# Patient Record
Sex: Male | Born: 1960 | Race: Black or African American | Hispanic: No | Marital: Married | Smoking: Never smoker
Health system: Southern US, Community
[De-identification: ages and names within clinical notes are randomized; demographics above are authoritative.]

## PROBLEM LIST (undated history)

## (undated) DIAGNOSIS — E0590 Thyrotoxicosis, unspecified without thyrotoxic crisis or storm: Secondary | ICD-10-CM

## (undated) DIAGNOSIS — K621 Rectal polyp: Secondary | ICD-10-CM

## (undated) DIAGNOSIS — M51369 Other intervertebral disc degeneration, lumbar region without mention of lumbar back pain or lower extremity pain: Secondary | ICD-10-CM

## (undated) DIAGNOSIS — I10 Essential (primary) hypertension: Secondary | ICD-10-CM

## (undated) DIAGNOSIS — K602 Anal fissure, unspecified: Secondary | ICD-10-CM

## (undated) DIAGNOSIS — K219 Gastro-esophageal reflux disease without esophagitis: Secondary | ICD-10-CM

## (undated) DIAGNOSIS — D179 Benign lipomatous neoplasm, unspecified: Secondary | ICD-10-CM

## (undated) DIAGNOSIS — K760 Fatty (change of) liver, not elsewhere classified: Secondary | ICD-10-CM

## (undated) DIAGNOSIS — E119 Type 2 diabetes mellitus without complications: Secondary | ICD-10-CM

## (undated) HISTORY — PX: BUNIONECTOMY: SHX129

## (undated) HISTORY — DX: Anal fissure, unspecified: K60.2

## (undated) HISTORY — DX: Fatty (change of) liver, not elsewhere classified: K76.0

## (undated) HISTORY — PX: COLONOSCOPY: SHX174

## (undated) HISTORY — DX: Essential (primary) hypertension: I10

## (undated) HISTORY — DX: Rectal polyp: K62.1

## (undated) HISTORY — DX: Type 2 diabetes mellitus without complications: E11.9

## (undated) HISTORY — PX: SHOULDER SURGERY: SHX246

## (undated) HISTORY — PX: CATARACT EXTRACTION: SUR2

## (undated) HISTORY — PX: CATARACT EXTRACTION W/ INTRAOCULAR LENS IMPLANT: SHX1309

## (undated) HISTORY — DX: Other intervertebral disc degeneration, lumbar region without mention of lumbar back pain or lower extremity pain: M51.369

## (undated) HISTORY — DX: Benign lipomatous neoplasm, unspecified: D17.9

---

## 2001-01-22 ENCOUNTER — Encounter: Payer: Self-pay | Admitting: Family Medicine

## 2001-01-22 ENCOUNTER — Ambulatory Visit (HOSPITAL_COMMUNITY): Admission: RE | Admit: 2001-01-22 | Discharge: 2001-01-22 | Payer: Self-pay | Admitting: Family Medicine

## 2002-12-15 ENCOUNTER — Encounter
Admission: RE | Admit: 2002-12-15 | Discharge: 2002-12-15 | Payer: Self-pay | Admitting: Physical Medicine and Rehabilitation

## 2002-12-15 ENCOUNTER — Encounter: Payer: Self-pay | Admitting: Physical Medicine and Rehabilitation

## 2004-06-20 ENCOUNTER — Encounter: Admission: RE | Admit: 2004-06-20 | Discharge: 2004-06-20 | Payer: Self-pay | Admitting: Family Medicine

## 2005-07-23 HISTORY — PX: CERVICAL DISC ARTHROPLASTY: SHX587

## 2005-08-02 ENCOUNTER — Ambulatory Visit (HOSPITAL_COMMUNITY): Admission: AD | Admit: 2005-08-02 | Discharge: 2005-08-05 | Payer: Self-pay | Admitting: Neurosurgery

## 2006-10-08 IMAGING — CR DG SPINE 1V PORT
1 series · 1 of 1 positions shown · non-contrast
Comparison: none

CLINICAL DATA: Anterior cervical diskectomy and fusion.
PORTABLE CERVICAL SPINE, ONE VIEW:

[view not recorded]
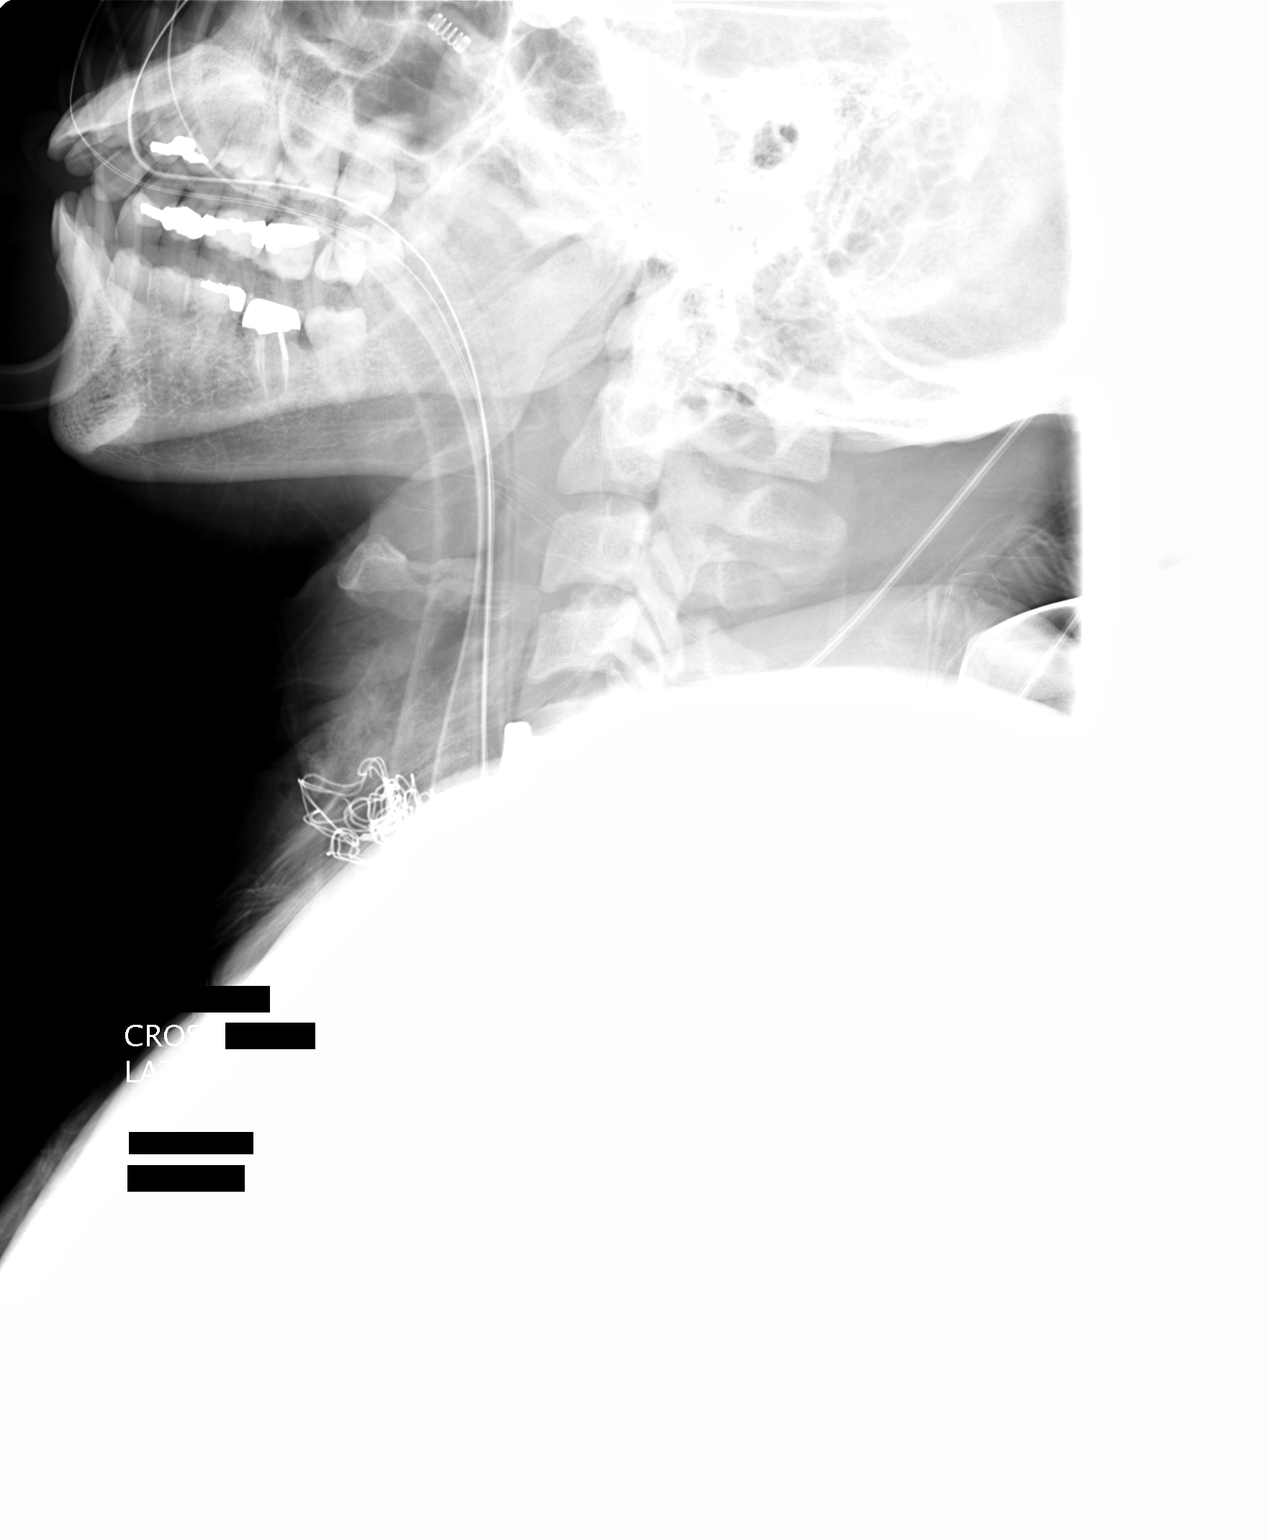

[1 of 1 positions shown; findings below may reference images not displayed]

FINDINGS: Cross-table lateral view obtained at 2050 hours is correlated with the intraoperative views done earlier today.  This view demonstrates the superior aspect of an anterior plate and screws at the C5 level.  There is no visualization of the cervical spine inferior to C5 due to the patient?s overlapping shoulders.
IMPRESSION: Intraoperative views during cervical fusion as described.  The visualization inferior to C5 is limited.  Radiographic follow-up in the department is suggested.

## 2009-10-03 ENCOUNTER — Encounter: Admission: RE | Admit: 2009-10-03 | Discharge: 2009-10-03 | Payer: Self-pay | Admitting: Family Medicine

## 2010-08-04 ENCOUNTER — Emergency Department (HOSPITAL_COMMUNITY)
Admission: EM | Admit: 2010-08-04 | Discharge: 2010-08-04 | Payer: Self-pay | Source: Home / Self Care | Admitting: Family Medicine

## 2010-12-08 NOTE — Op Note (Signed)
Jeffery [name], Jeffery [name]              ACCOUNT NO.:  [id number]   MEDICAL RECORD NO.:  [id number]          PATIENT TYPE:  AMB   LOCATION:  SDS                          FACILITY:  [facility]   PHYSICIAN:  Danae Orleans. Venetia Maxon, M.D.  DATE OF BIRTH:  1961/07/04   DATE OF PROCEDURE:  08/02/2005  DATE OF DISCHARGE:                                 OPERATIVE REPORT   PREOPERATIVE DIAGNOSIS:  Herniated cervical disc with spondylosis,  degenerative disc disease and cervical radiculopathy C5-6.   POSTOPERATIVE DIAGNOSIS:  Herniated cervical disc with spondylosis,  degenerative disc disease and cervical radiculopathy C5-6.   PROCEDURE:  Anterior cervical decompression and fusion C5-6 with PEEK  interbody cage, morselized bone autograft and anterior cervical plate.   SURGEON:  Danae Orleans. Venetia Maxon, M.D.   ASSISTANT:  Cristi Loron, M.D.   ANESTHESIA:  General endotracheal anesthesia.   ESTIMATED BLOOD LOSS:  Minimal.   COMPLICATIONS:  None.   DISPOSITION:  Recovery room.   INDICATIONS FOR PROCEDURE:  Jeffery [name] is a 50 year old man with  significant left C6 radiculopathy.  It was elected to take him to surgery  for anterior cervical decompression and fusion at C5-6 level.   DESCRIPTION OF PROCEDURE:  Jeffery [name] was brought to the operating room.  Following satisfactory and uncomplicated induction of general endotracheal  anesthesia and placement of intravenous lines, patient was placed in the  supine position on the operating table.  His neck was placed in slight  extension.  He was placed in 10 pounds of Halter traction. His anterior neck  was then prepped and draped in the usual sterile fashion.  Area of planned  incision was infiltrated with 0.25% Marcaine and 0.5% lidocaine with  1:200,000 epinephrine.  Incision was made from the midline to the anterior  border of the sternocleidomastoid, carried sharply through the platysma  muscle.  Subplatysmal dissection was performed exposing the  anterior border  of sternocleidomastoid muscle.  Using blunt dissection, the carotid sheath  was kept lateral and trachea and esophagus kept medial exposing the anterior  cervical spine.  A bent spinal needle was placed through what was felt to be  the C5-6 level and this was confirmed on intraoperative x-ray.  Subsequently, the longus colli muscles were taken down along the anterior  cervical spine from C5 through C6 level and Shadowline self-retaining  retractor was placed to facilitate exposure.  The anterior space was then  incised and ventral osteophytes removed with Leksell rongeur and retained  for later use as bone graft material.  The interspace was then decorticated  with high speed drill and the bone drillings were retained for later use  with bone grafting.  There end plates were decorticated along the large  uncinated spurs.  The posterior longitudinal ligament was then incised with  arachnoid knife and removed in piecemeal fashion.  There was a significant  amount of lateral disc herniation causing compression of the cervical spinal  cord and also the C6 nerve root and this was decompressed as it extended out  the neural foramen on the left side of the midline.  Spinal cord  dura and  both C6 nerve roots were well decompressed.  Hemostasis was assured with  Gelfoam soaked in thrombus.  After trial sizing a 7 mm PEEK interbody cage  was selected, packed with morselized bone autograft, inserted in the  interspace and countersunk appropriately and subsequently a 16 mm anterior  cervical plate was affixed to the anterior cervical spine.  After the  traction weight was removed, the plate was affixed with 14 mm variable angle  screws, two at C5 and two at C6.  All screws had excellent purchase.  Locking mechanisms were engaged.  Final x-ray demonstrated the top of the  anterior cervical plate without the lower part of the construct visualized  because of the patient's large body  habitus.  The wound was then copiously  irrigated with bacitracin and saline.  Soft tissues were inspected and found  to be in good repair.  The platysmal layer was reapproximated with 3-0  Vicryl interrupted inverted sutures and the skin edges were reapproximated  with interrupted 3-0 Vicryl subcutaneous stitch.  The wound was dressed with  Dermabond.  The patient was extubated in the operating room and taken to the  recovery room in stable and satisfactory condition having tolerated the  procedure well.  Counts correct at the end of the case.      Danae Orleans. Venetia Maxon, M.D.  Electronically Signed     JDS/MEDQ  D:  08/02/2005  T:  08/03/2005  Job:  [id number]

## 2011-01-23 ENCOUNTER — Encounter (INDEPENDENT_AMBULATORY_CARE_PROVIDER_SITE_OTHER): Payer: Self-pay | Admitting: General Surgery

## 2011-01-29 NOTE — Progress Notes (Signed)
This encounter was created in error - please disregard.

## 2011-04-18 ENCOUNTER — Encounter (HOSPITAL_BASED_OUTPATIENT_CLINIC_OR_DEPARTMENT_OTHER)
Admission: RE | Admit: 2011-04-18 | Discharge: 2011-04-18 | Disposition: A | Discharge status: Home / Self Care | Payer: BC Managed Care – PPO | Source: Ambulatory Visit | Attending: Orthopedic Surgery | Admitting: Orthopedic Surgery

## 2011-04-18 LAB — BASIC METABOLIC PANEL
BUN: 17 mg/dL (ref 6–23)
CO2: 30 mEq/L (ref 19–32)
Chloride: 100 mEq/L (ref 96–112)
Creatinine, Ser: 1.09 mg/dL (ref 0.50–1.35)
GFR calc non Af Amer: >60 mL/min (ref >60)
Glucose, Bld: 96 mg/dL (ref 70–99)

## 2011-04-23 ENCOUNTER — Ambulatory Visit (HOSPITAL_BASED_OUTPATIENT_CLINIC_OR_DEPARTMENT_OTHER)
Admission: RE | Admit: 2011-04-23 | Discharge: 2011-04-23 | Disposition: A | Discharge status: Home / Self Care | Payer: BC Managed Care – PPO | Source: Ambulatory Visit | Attending: Orthopedic Surgery | Admitting: Orthopedic Surgery

## 2011-04-23 DIAGNOSIS — Z01812 Encounter for preprocedural laboratory examination: Secondary | ICD-10-CM | POA: Insufficient documentation

## 2011-04-23 DIAGNOSIS — W19XXXA Unspecified fall, initial encounter: Secondary | ICD-10-CM | POA: Insufficient documentation

## 2011-04-23 DIAGNOSIS — M67919 Unspecified disorder of synovium and tendon, unspecified shoulder: Secondary | ICD-10-CM | POA: Insufficient documentation

## 2011-04-23 DIAGNOSIS — Y929 Unspecified place or not applicable: Secondary | ICD-10-CM | POA: Insufficient documentation

## 2011-04-23 DIAGNOSIS — M719 Bursopathy, unspecified: Secondary | ICD-10-CM | POA: Insufficient documentation

## 2011-04-23 LAB — POCT HEMOGLOBIN-HEMACUE: Hemoglobin: 15 g/dL (ref 13.0–17.0)

## 2011-05-01 NOTE — Op Note (Signed)
[name], [name]              ACCOUNT NO.:  [id number]  MEDICAL RECORD NO.:  [id number]  LOCATION:                                 FACILITY:  PHYSICIAN:  Jones Broom, MD         DATE OF BIRTH:  DATE OF PROCEDURE:  04/23/2011 DATE OF DISCHARGE:                              OPERATIVE REPORT   PREOPERATIVE DIAGNOSES: 1. Right shoulder infraspinatus tear. 2. Right shoulder irreparable supraspinatus tear.  POSTOPERATIVE DIAGNOSES: 1. Right shoulder infraspinatus tear. 2. Right shoulder irreparable supraspinatus tear.  PROCEDURE PERFORMED: 1. Right shoulder arthroscopic repair of infraspinatus. 2. Right shoulder arthroscopic debridement of irreparable     supraspinatus tear.  ATTENDING SURGEON:  Jones Broom, MD  ASSISTANT:  None.  ANESTHESIA:  GETA with preoperative interscalene block.  COMPLICATIONS:  None.  DRAINS:  None.  SPECIMENS:  None.  ESTIMATED BLOOD LOSS:  Minimal.  INDICATIONS FOR SURGERY:  Jeffery [name] is a 50 year old gentleman who had previous surgery by my partner, Dr. Turner Daniels including subacromial decompression, distal clavicle excision, and debridement of supraspinatus tear in November 01, 2010.  The tear was deemed irreparable at that time.  He had a fall approximately 1 month ago extending the tear into the infraspinatus  verified by MRI imaging.  He was referred to me for possible treatment options.  The MRI did show some intramuscular and musculotendinous component to the tear but I felt that if any repair was possible it should be attempted as soon as possible. The patient agreed and wished to go ahead with surgery.  He understood risks, benefits and alternatives to surgery including but not limited to risk of nonhealing, re-tear, and a potential for stiffness.  He understood all this and elected to go forward with surgery.  OPERATIVE FINDINGS:  The supraspinatus tendon was retracted beyond the glenoid and was immobile and scarred in.  The  infraspinatus tear appeared to be fresh with some hyperemia at the posterior tuberosity. There was some component of the tear at the musculotendinous junction and the residual tendon was noted on the posterior aspect of tuberosity and was debrided.  He also was noted to have some residual supraspinatus tendon on the superior aspect of the greater tuberosity which was also debrided back to the tuberosity.  Extensive mobilization of the infraspinatus was carried out performing a complete capsular release posteriorly as well as release of the subdeltoid adhesions and a posterior interval release.  After all these releases, I was able to just bring the tendon to the prepared tuberosity and therefore felt that it was worth trying to repair this.  The repair was carried out in a double row fashion with a 5.5-mm BioComposite corkscrew anchor in the medial row and a 4.5 mm BioComposite PushLock anchor in the lateral row.  PROCEDURE:  The patient was identified in the preoperative holding area where I personally marked the operative site after verifying the site, side, and procedure with the patient.  He was taken back to the operating room where general anesthesia was induced without complication.  He did have interscalene block given in the preoperative holding area by the attending anesthesiologist.  After general anesthesia was induced, he was  placed in the beach-chair position with all extremities carefully padded in position.  The right upper extremity was prepped and draped in a standard sterile fashion.  He did receive IV antibiotics, and the appropriate time-out procedure was carried out.  A standard posterior portal was established and the arthroscope was introduced into the joint with diagnostic arthroscopy carried out with findings as described above  after an anterior portal was established just above the subscapularis with needle localization.  He was noted to have some residual  tendon on the superior aspect of the greater tuberosity, which was debrided back to the tuberosity to ensure a smooth surface and no impingement of mechanical symptoms.  There was approximately 8 mm or so of residual tendon laterally.  The supraspinatus was noted to be retracted beyond the glenoid margin and was certainly not repairable.  The joint surfaces were intact. Subscapularis was completely intact.  Glenohumeral ligaments and labrum was intact.  The infraspinatus was noted to be torn and retracted.  On careful exam,  there was noted to be some residual tendon on the posterior aspect of the greater tuberosity from the infraspinatus.  This was all debrided off the tuberosity as it was not a good tissue and could not be incorporated into the repair.  The remaining infraspinatus tendon was carefully mobilized from the surrounding subdeltoid adhesions and an anterolateral accessory portal was made to view through the lateral portal and a switching stick was placed through the anterolateral accessory portal with the use of the retractor to allow for capsular release from the posterior portal using the ArthroCare. After complete capsular release, a grasper was used to try to bring the tendon over and was unable to bring the tendon within a reasonable distance to hope for a repair.  Therefore, a posterior interval release was performed identifying the spine of the scapula and releasing the interval between the supra and infraspinatus.  The interval between the deltoid and the infraspinatus was also carefully released.  After performing all these releases, I was able to just reduce the tendon through the tuberosity and therefore I felt that a repair would be possible and should be attempted given the limited rotator cuff that he has.  Therefore, I decorticated the tuberosity to promote bony healing and a 5.5-mm BioComposite corkscrew anchor was passed through a percutaneous posterolateral  accessory portal position.  The sutures were then passed one by one in a horizontal mattress configuration through the infraspinatus tendon posteriorly using a 45-degree curved suture lasso.  After each was passed, a grasper was used to reduce the tendon to the tuberosity and each was then tied.  The strands were not cut and strands were then incorporated into a 4.5-mm PushLock anchor, which was placed in the lateral aspect of the posterior tuberosity bringing the tendon nicely down onto the prepared tuberosity.  Sutures were cut.  The repair was viewed from all portals and felt to be good.  Tissue quality was moderate.  I did externally rotate the arm to about 20 degrees to assist in ease of repair.  Therefore, postoperatively, I placed him in an abduction sling immobilizer.  After the arthroscopic equipment was removed from the joint, the portals were closed with 3-0 nylon in interrupted fashion.  Sterile dressings were applied including Xeroform, 4x4s, ABDs, and tape.  The patient was placed in an abduction pillow sling immobilizer, allowed to awaken from general anesthesia, transferred to the stretcher, and taken to the recovery room in stable condition.  POSTOPERATIVE  PLAN:  He will be discharged home with his wife today.  He will follow up with me in 1 week for suture removal and wound check.  He will remain in a sling at all times.     Jones Broom, MD     JC/MEDQ  D:  04/23/2011  T:  04/24/2011  Job:  [id number]  Electronically Signed by Jones Broom  on 05/01/2011 09:30:15 AM

## 2012-02-12 ENCOUNTER — Encounter (INDEPENDENT_AMBULATORY_CARE_PROVIDER_SITE_OTHER): Payer: Self-pay | Admitting: General Surgery

## 2012-02-12 ENCOUNTER — Ambulatory Visit (INDEPENDENT_AMBULATORY_CARE_PROVIDER_SITE_OTHER): Payer: BC Managed Care – PPO | Admitting: General Surgery

## 2012-02-12 VITALS — BP 152/92 | HR 74 | Temp 97.8°F | Resp 16 | Ht 73.0 in | Wt 279.0 lb

## 2012-02-12 DIAGNOSIS — D171 Benign lipomatous neoplasm of skin and subcutaneous tissue of trunk: Secondary | ICD-10-CM | POA: Insufficient documentation

## 2012-02-12 DIAGNOSIS — D1739 Benign lipomatous neoplasm of skin and subcutaneous tissue of other sites: Secondary | ICD-10-CM

## 2012-02-12 NOTE — Progress Notes (Signed)
Patient ID: Jeffery [name], male   DOB: 1961/06/05, 51 y.o.   MRN: [id number]  Chief Complaint  Patient presents with  . Lipoma    HPI Jeffery [name] is a 51 y.o. male.  Multiple symptomatic lipomas HPI  Past Medical History  Diagnosis Date  . Hypertension   . Fatty liver   . Hyperplastic rectal polyp   . Anal fissure   . Lipoma     Past Surgical History  Procedure Date  . Shoulder surgery 2008 and 2012    left in 2008, right had rtc tear 2012  . Cervical disc arthroplasty 2007    C-5 and C-6    Family History  Problem Relation Age of Onset  . Diabetes Mother   . Diabetes Father     Social History History  Substance Use Topics  . Smoking status: Never Smoker   . Smokeless tobacco: Never Used  . Alcohol Use: No    Allergies  Allergen Reactions  . Other Hives    All steroids. Hives on upper torso only.    Current Outpatient Prescriptions  Medication Sig Dispense Refill  . doxepin (SINEQUAN) 10 MG capsule Take 10 mg by mouth 2 (two) times daily.      . methylPREDNISolone (MEDROL) 4 MG tablet Take 4 mg by mouth daily.      Marland Kitchen amLODipine (NORVASC) 5 MG tablet Take 5 mg by mouth daily.        . valsartan-hydrochlorothiazide (DIOVAN-HCT) 320-25 MG per tablet Take 1 tablet by mouth daily.          Review of Systems Review of Systems  All other systems reviewed and are negative.    Blood pressure 152/92, pulse 74, temperature 97.8 F (36.6 C), temperature source Temporal, resp. rate 16, height 6\' 1"  (1.854 m), weight 279 lb (126.554 kg).  Physical Exam Physical Exam  Constitutional: He appears well-developed and well-nourished.  HENT:  Head: Normocephalic and atraumatic.  Eyes: Conjunctivae are normal. Pupils are equal, round, and reactive to light.  Neck: Normal range of motion.  Cardiovascular: Normal rate, regular rhythm and normal heart sounds.   Pulmonary/Chest: Effort normal and breath sounds normal.  Abdominal: Soft. Bowel sounds are normal.       Data Reviewed None  Assessment    Multiple truncal symptomatic lipomas    Plan    Excise truncal lipomas as outpatient       Telesforo Brosnahan O 02/12/2012, 11:48 AM

## 2012-03-13 ENCOUNTER — Ambulatory Visit (HOSPITAL_BASED_OUTPATIENT_CLINIC_OR_DEPARTMENT_OTHER): Admit: 2012-03-13 | Payer: Self-pay | Admitting: General Surgery

## 2012-03-13 ENCOUNTER — Encounter (HOSPITAL_BASED_OUTPATIENT_CLINIC_OR_DEPARTMENT_OTHER): Payer: Self-pay

## 2012-03-13 SURGERY — EXCISION LIPOMA
Anesthesia: Monitor Anesthesia Care

## 2012-05-20 ENCOUNTER — Encounter (HOSPITAL_BASED_OUTPATIENT_CLINIC_OR_DEPARTMENT_OTHER): Admission: RE | Payer: Self-pay | Source: Ambulatory Visit

## 2012-05-20 ENCOUNTER — Ambulatory Visit (HOSPITAL_BASED_OUTPATIENT_CLINIC_OR_DEPARTMENT_OTHER)
Admission: RE | Admit: 2012-05-20 | Payer: Managed Care, Other (non HMO) | Source: Ambulatory Visit | Admitting: General Surgery

## 2012-05-20 SURGERY — EXCISION LIPOMA
Anesthesia: Monitor Anesthesia Care

## 2013-06-19 ENCOUNTER — Emergency Department (HOSPITAL_COMMUNITY)
Admission: EM | Admit: 2013-06-19 | Discharge: 2013-06-19 | Disposition: A | Discharge status: Home / Self Care | Payer: 59 | Attending: Emergency Medicine | Admitting: Emergency Medicine

## 2013-06-19 ENCOUNTER — Emergency Department (HOSPITAL_COMMUNITY): Payer: 59

## 2013-06-19 ENCOUNTER — Encounter (HOSPITAL_COMMUNITY): Payer: Self-pay | Admitting: Emergency Medicine

## 2013-06-19 DIAGNOSIS — IMO0001 Reserved for inherently not codable concepts without codable children: Secondary | ICD-10-CM | POA: Insufficient documentation

## 2013-06-19 DIAGNOSIS — Z79899 Other long term (current) drug therapy: Secondary | ICD-10-CM | POA: Insufficient documentation

## 2013-06-19 DIAGNOSIS — I10 Essential (primary) hypertension: Secondary | ICD-10-CM | POA: Insufficient documentation

## 2013-06-19 DIAGNOSIS — Z862 Personal history of diseases of the blood and blood-forming organs and certain disorders involving the immune mechanism: Secondary | ICD-10-CM | POA: Insufficient documentation

## 2013-06-19 DIAGNOSIS — Z8719 Personal history of other diseases of the digestive system: Secondary | ICD-10-CM | POA: Insufficient documentation

## 2013-06-19 DIAGNOSIS — M25522 Pain in left elbow: Secondary | ICD-10-CM

## 2013-06-19 DIAGNOSIS — Z8639 Personal history of other endocrine, nutritional and metabolic disease: Secondary | ICD-10-CM | POA: Insufficient documentation

## 2013-06-19 DIAGNOSIS — M25529 Pain in unspecified elbow: Secondary | ICD-10-CM | POA: Insufficient documentation

## 2013-06-19 DIAGNOSIS — IMO0002 Reserved for concepts with insufficient information to code with codable children: Secondary | ICD-10-CM | POA: Insufficient documentation

## 2013-06-19 MED ORDER — OXYCODONE-ACETAMINOPHEN 5-325 MG PO TABS
2.0000 | ORAL_TABLET | Freq: Once | ORAL | Status: AC
  Administered 2013-06-19: 2 via ORAL
  Filled 2013-06-19: qty 2

## 2013-06-19 MED ORDER — PREDNISONE 20 MG PO TABS: 40.0000 mg | ORAL_TABLET | Freq: Every day | ORAL | Status: DC

## 2013-06-19 MED ORDER — OXYCODONE-ACETAMINOPHEN 5-325 MG PO TABS: 1.0000 | ORAL_TABLET | Freq: Four times a day (QID) | ORAL | Status: DC | PRN

## 2013-06-19 NOTE — ED Provider Notes (Signed)
CSN: [id number]     Arrival date & time 06/19/13  0213 History   First MD Initiated Contact with Patient 06/19/13 0221     Chief Complaint  Patient presents with  . Elbow Pain   (Consider location/radiation/quality/duration/timing/severity/associated sxs/prior Treatment) HPI Comments: Patient is a 52 year old male with no significant past medical history presents for left elbow pain with onset 3 days ago. Patient states the pain is sharp and stabbing in nature and radiates down his left forearm. Patient saw his primary care doctor 2 days ago who advised Tylenol for pain control as well as followup on Monday. Patient has been taking Tylenol without relief of symptoms and states the pain has been worsening over time. He states the pain is aggravated with movement of his left elbow as well as with palpation to his radial head. Patient denies associated fever, redness, numbness/tingling, and weakness.  The history is provided by the patient. No language interpreter was used.    Past Medical History  Diagnosis Date  . Hypertension   . Fatty liver   . Hyperplastic rectal polyp   . Anal fissure   . Lipoma    Past Surgical History  Procedure Laterality Date  . Shoulder surgery  2008 and 2012    left in 2008, right had rtc tear 2012  . Cervical disc arthroplasty  2007    C-5 and C-6   Family History  Problem Relation Age of Onset  . Diabetes Mother   . Diabetes Father    History  Substance Use Topics  . Smoking status: Never Smoker   . Smokeless tobacco: Never Used  . Alcohol Use: No    Review of Systems  Musculoskeletal: Positive for arthralgias and myalgias.  All other systems reviewed and are negative.    Allergies  Other and Nsaids  Home Medications   Current Outpatient Rx  Name  Route  Sig  Dispense  Refill  . amLODipine (NORVASC) 5 MG tablet   Oral   Take 5 mg by mouth daily.           . Coenzyme Q10 (CO Q 10 PO)   Oral   Take 1 tablet by mouth daily.        Marland Kitchen omega-3 acid ethyl esters (LOVAZA) 1 G capsule   Oral   Take 1 g by mouth daily.         . valsartan-hydrochlorothiazide (DIOVAN-HCT) 320-25 MG per tablet   Oral   Take 1 tablet by mouth daily.           Marland Kitchen oxyCODONE-acetaminophen (PERCOCET/ROXICET) 5-325 MG per tablet   Oral   Take 1-2 tablets by mouth every 6 (six) hours as needed for severe pain.   13 tablet   0   . predniSONE (DELTASONE) 20 MG tablet   Oral   Take 2 tablets (40 mg total) by mouth daily.   10 tablet   0    BP 134/74  Pulse 62  Temp(Src) 98 F (36.7 C) (Oral)  Resp 18  Ht 6\' 2"  (1.88 m)  Wt 258 lb (117.028 kg)  BMI 33.11 kg/m2  SpO2 100%  Physical Exam  Nursing note and vitals reviewed. Constitutional: He is oriented to person, place, and time. He appears well-developed and well-nourished. No distress.  HENT:  Head: Normocephalic and atraumatic.  Eyes: Conjunctivae and EOM are normal. No scleral icterus.  Neck: Normal range of motion.  Cardiovascular: Normal rate, regular rhythm and intact distal pulses.   Distal radial  pulses 2+ bilaterally. Capillary refill normal.  Pulmonary/Chest: Effort normal. No respiratory distress.  Musculoskeletal:       Left elbow: He exhibits decreased range of motion (Secondary to pain). He exhibits no swelling, no effusion and no deformity. Tenderness (mild, proximal and distal to elbow) found. Radial head tenderness noted. No olecranon process tenderness noted.       Left upper arm: He exhibits tenderness. He exhibits no bony tenderness, no swelling and no edema.       Left forearm: He exhibits tenderness. He exhibits no bony tenderness, no swelling and no edema.       Arms: Neurological: He is alert and oriented to person, place, and time.  No gross sensory deficits appreciated. 5/5 strength against resistance with flexion and extension of L elbow.   Skin: Skin is warm and dry. No rash noted. He is not diaphoretic. No erythema. No pallor.  Psychiatric: He  has a normal mood and affect. His behavior is normal.    ED Course  Procedures (including critical care time) Labs Review Labs Reviewed - No data to display  Imaging Review Dg Elbow Complete Left  06/19/2013   CLINICAL DATA:  Severe left elbow pain. No known injury. Difficulty in positioning patient for lateral oblique views due to pain.  EXAM: LEFT ELBOW - COMPLETE 3+ VIEW  COMPARISON:  None.  FINDINGS: There is cortical irregularity over the coronoid process with focal cortical depression suggesting a fracture. There is a small left elbow effusion. Prominent olecranon spur.  IMPRESSION: Cortical changes consistent with fracture of the coronoid process of the ulna.   Electronically Signed   By: Burman Nieves M.D.   On: 06/19/2013 04:24    EKG Interpretation   None       MDM   1. Elbow pain, left    Uncomplicated left elbow pain. Patient well and nontoxic appearing, hemodynamically stable, and afebrile. Patient neurovascularly intact on physical exam. Bony tenderness appreciated as well as decreased range of motion secondary to pain. No crepitus, deformities, or effusions appreciated. X-ray today significant for cortical changes of ulna. Radiologist questions fracture of coronoid process of the ulna given these findings; however, my suspicion is lower for this given areas of bony tenderness on patient's physical exam. Still, patient has been instructed to continue use of his arm sling which was provided by his primary care provider. Pain improved with Percocet. No evidence of septic joint.   Patient stable and appropriate for discharge with orthopedic followup. Have also advised he keep his primary care appointment on Monday; patient reliable for followup. Percocet prescribed for pain control as needed. Return precautions discussed and patient agreeable to plan with no unaddressed concerns.    Antony Madura, PA-C 06/22/13 [phone]

## 2013-06-19 NOTE — ED Notes (Signed)
Pt states he first notices his left elbow pain on Tuesday of this week, pt states he saw his primary care provider Wednesday and his MD told him to follow up on Monday if the pain did not improve. Pt states he does not know what caused his elbow pain and denies any injury to the site. Pt states the pain is not getting any better, pt is able to make a fist but has pain upon doing so, pt also has pain with flexion of the wrist. Pt is unable to extend or hyperextend his left elbow. Pt states the left elbow is very tender to the touch. Pt states the pain is so bad that he cannot sleep. Pt states he could not wait until Monday to follow up with his primary. Pt states he has an allergy NSAIDS. Pt states he has been taken tylenol to manage his pain but it has not given him any relief. Pt is A&O X4.

## 2013-06-23 NOTE — ED Provider Notes (Signed)
Medical screening examination/treatment/procedure(s) were performed by non-physician practitioner and as supervising physician I was immediately available for consultation/collaboration.  EKG Interpretation   None         Shanna Cisco, MD 06/23/13 [id number]

## 2014-01-26 ENCOUNTER — Other Ambulatory Visit: Payer: Self-pay | Admitting: Family Medicine

## 2014-01-26 DIAGNOSIS — R202 Paresthesia of skin: Secondary | ICD-10-CM

## 2014-01-31 ENCOUNTER — Other Ambulatory Visit: Payer: 59

## 2014-02-25 ENCOUNTER — Ambulatory Visit: Payer: 59

## 2014-03-04 ENCOUNTER — Ambulatory Visit: Payer: 59

## 2014-03-09 ENCOUNTER — Ambulatory Visit: Payer: 59

## 2014-03-11 ENCOUNTER — Ambulatory Visit: Payer: 59

## 2014-03-16 ENCOUNTER — Ambulatory Visit: Payer: 59

## 2014-03-23 ENCOUNTER — Ambulatory Visit: Payer: 59

## 2015-05-03 ENCOUNTER — Ambulatory Visit: Payer: Managed Care, Other (non HMO)

## 2015-05-10 ENCOUNTER — Ambulatory Visit: Payer: Managed Care, Other (non HMO)

## 2015-05-12 ENCOUNTER — Encounter: Payer: 59 | Attending: Family Medicine

## 2015-05-12 VITALS — Ht 73.0 in | Wt 270.1 lb

## 2015-05-12 DIAGNOSIS — Z713 Dietary counseling and surveillance: Secondary | ICD-10-CM | POA: Insufficient documentation

## 2015-05-12 DIAGNOSIS — E119 Type 2 diabetes mellitus without complications: Secondary | ICD-10-CM | POA: Diagnosis present

## 2015-05-12 NOTE — Progress Notes (Signed)

## 2015-05-17 ENCOUNTER — Ambulatory Visit: Payer: Managed Care, Other (non HMO)

## 2015-05-19 ENCOUNTER — Ambulatory Visit: Payer: Managed Care, Other (non HMO)

## 2015-05-26 ENCOUNTER — Ambulatory Visit: Payer: Managed Care, Other (non HMO)

## 2015-05-31 ENCOUNTER — Encounter: Payer: 59 | Attending: Family Medicine

## 2015-05-31 DIAGNOSIS — E119 Type 2 diabetes mellitus without complications: Secondary | ICD-10-CM | POA: Diagnosis present

## 2015-05-31 DIAGNOSIS — Z713 Dietary counseling and surveillance: Secondary | ICD-10-CM | POA: Insufficient documentation

## 2015-06-01 NOTE — Progress Notes (Signed)

## 2015-06-07 DIAGNOSIS — E119 Type 2 diabetes mellitus without complications: Secondary | ICD-10-CM

## 2015-06-09 ENCOUNTER — Ambulatory Visit: Payer: Managed Care, Other (non HMO)

## 2015-06-09 NOTE — Progress Notes (Signed)
Patient was seen on 06/07/15 for the third of a series of three diabetes self-management courses at the [facility].   Jeffery [name] the amount of activity recommended for healthy living . Describe activities suitable for individual needs . Identify ways to regularly incorporate activity into daily life . Identify barriers to activity and ways to over come these barriers  Identify diabetes medications being personally used and their primary action for lowering glucose and possible side effects . Describe role of stress on blood glucose and develop strategies to address psychosocial issues . Identify diabetes complications and ways to prevent them  Explain how to manage diabetes during illness . Evaluate success in meeting personal goal . Establish 2-3 goals that they will plan to diligently work on until they return for the  32-month follow-up visit  Goals:   I will count my carb choices at most meals and snacks  I will be active 60 minutes or more 5 times a week  To help manage stress I will  workout at least 4 times a week  Your patient has identified these potential barriers to change:  Stress  Your patient has identified their diabetes self-care support plan as  Family Support Plan:  Attend Optional Core 4 in 4 months

## 2015-06-30 ENCOUNTER — Ambulatory Visit: Payer: Managed Care, Other (non HMO)

## 2015-10-12 ENCOUNTER — Ambulatory Visit: Payer: 59

## 2016-08-02 ENCOUNTER — Other Ambulatory Visit: Payer: Self-pay | Admitting: *Deleted

## 2016-08-02 ENCOUNTER — Encounter: Payer: Self-pay | Admitting: Podiatry

## 2016-08-02 ENCOUNTER — Ambulatory Visit (INDEPENDENT_AMBULATORY_CARE_PROVIDER_SITE_OTHER): Payer: 59 | Admitting: Podiatry

## 2016-08-02 ENCOUNTER — Ambulatory Visit (INDEPENDENT_AMBULATORY_CARE_PROVIDER_SITE_OTHER): Payer: 59

## 2016-08-02 DIAGNOSIS — Z0189 Encounter for other specified special examinations: Secondary | ICD-10-CM

## 2016-08-02 DIAGNOSIS — M2012 Hallux valgus (acquired), left foot: Secondary | ICD-10-CM | POA: Diagnosis not present

## 2016-08-02 DIAGNOSIS — E119 Type 2 diabetes mellitus without complications: Secondary | ICD-10-CM

## 2016-08-02 NOTE — Progress Notes (Signed)
   Subjective:    Patient ID: Jeffery [name], male    DOB: 24-Apr-1961, 56 y.o.   MRN: [id number]  HPI: He presents today for second opinion regarding a procedure that had been performed by another local podiatrist December 7. He has spur removed from the dorsal aspect of the great toe and a bunion repair performed. Since that time he has followed out of favor with the Dr. siding poor communication. He states that he has nothing to go by and no expectations.    Review of Systems  Musculoskeletal: Positive for arthralgias and gait problem.  Skin: Positive for color change.  All other systems reviewed and are negative.      Objective:   Physical Exam: Vital signs are stable he is alert and oriented 3 have reviewed his past mental history medications allergy surgery social history the paperwork that he brought with him explaining his surgery. He also brought a disc for his x-rays. Moderate edema to the left foot pulses are strongly palpable. Good range of motion of the first metatarsophalangeal joint. I reviewed radiographs brought with him today which do demonstrate a Weil type osteotomy to the first metatarsal with screw fixation as well as a IPJ fusion of the hallux. Everything appears to be in good alignment and healing well. I do think that the screw used for the IPJ fusion is small however it appears to be compressing the joint. There are no open wounds or lesions. No signs of infection.          Assessment & Plan:  Assessment: Vital signs are stable wounds have healed one month postop left foot surgery.  Plan: I explained to him that I would be happy to take care of him as he heals since he will not go back to his previous doctor. I expressed to him that at this point he needs to be in a Darco shoe and is not to be in any other shoe at this point. I did explain that he may wear his Cam Walker as needed. I will follow-up with him in 1 month at which time radiographs will be taken. I  did express to him how to compress the toe with [place] and he will continue to do this.

## 2016-08-07 ENCOUNTER — Encounter: Payer: Self-pay | Admitting: Podiatry

## 2016-08-30 ENCOUNTER — Ambulatory Visit (INDEPENDENT_AMBULATORY_CARE_PROVIDER_SITE_OTHER): Payer: 59 | Admitting: Podiatry

## 2016-08-30 ENCOUNTER — Ambulatory Visit (INDEPENDENT_AMBULATORY_CARE_PROVIDER_SITE_OTHER): Payer: 59

## 2016-08-30 ENCOUNTER — Encounter: Payer: Self-pay | Admitting: Podiatry

## 2016-08-30 DIAGNOSIS — M2012 Hallux valgus (acquired), left foot: Secondary | ICD-10-CM

## 2016-08-30 NOTE — Progress Notes (Signed)
He presents today for follow-up of his first metatarsal osteotomy and hallux interphalangeal joint fusion. He states that he is doing much better and he is happy with the care that he is receiving at the [facility] because we have his wounds healing and his foot is feeling much better.  Objective: Vital signs are stable he is alert and oriented 3 surgical foot left appears to be healing quite nicely good range of motion of the first metatarsophalangeal joint. He has tenderness on palpation of the hallux interphalangeal joint. Radiographs taken today demonstrate a well-healing osteotomy of the first metatarsal with screw fixation however I feel that the screw placed by another physician is a little short for complete fusion of the IP joint of the hallux. However there does appear to be some bony bridging taken place.  Assessment: Well-healing first metatarsal osteotomy slow healing hallux interphalangeal joint left foot.  Plan: Follow up with him in 2-4 weeks.

## 2016-09-20 ENCOUNTER — Encounter: Payer: Self-pay | Admitting: Podiatry

## 2016-09-20 ENCOUNTER — Ambulatory Visit (INDEPENDENT_AMBULATORY_CARE_PROVIDER_SITE_OTHER): Payer: Self-pay | Admitting: Podiatry

## 2016-09-20 ENCOUNTER — Ambulatory Visit (INDEPENDENT_AMBULATORY_CARE_PROVIDER_SITE_OTHER): Payer: 59

## 2016-09-20 DIAGNOSIS — M2012 Hallux valgus (acquired), left foot: Secondary | ICD-10-CM

## 2016-09-22 NOTE — Progress Notes (Signed)
He presents today for follow-up of his bunion repair and his hallux interphalangeal joint fusion date of surgery 06/28/2016 by different podiatrists. He states that is doing better than it was.  Objective: He has great range of motion of first metatarsophalangeal joint of the left foot. He has a little tenderness on attempted range of motion at the hallux interphalangeal joint. Radiographs demonstrate well-healing first metatarsal osteotomy however the hallux interphalangeal joint is not healing appropriately.  Assessment: Delayed healing hallux interphalangeal joint well healed first metatarsal osteotomy.  Plan: I recommended he try to get back into a pair of tennis shoes melena follow-up with him in a couple of weeks at which time if he is not better we may consider revisiting the previous surgery.

## 2016-10-18 ENCOUNTER — Ambulatory Visit (INDEPENDENT_AMBULATORY_CARE_PROVIDER_SITE_OTHER): Payer: Self-pay | Admitting: Podiatry

## 2016-10-18 ENCOUNTER — Ambulatory Visit (INDEPENDENT_AMBULATORY_CARE_PROVIDER_SITE_OTHER): Payer: 59

## 2016-10-18 DIAGNOSIS — M2012 Hallux valgus (acquired), left foot: Secondary | ICD-10-CM

## 2016-10-18 NOTE — Progress Notes (Signed)
He presents today for follow-up of his first metatarsal osteotomy and hallux interphalangeal joint fusion that was performed by another surgeon. He states that other than some numbness and tingling is feeling pretty decent.  Objective: Vital signs are stable he is alert and oriented 3. Pulses are palpable. He has mild tenderness on palpation of the hallux interphalangeal joint left. Radiographs do demonstrate what appears to be early ossification of the arthrodesis site.  Assessment: Slow arthrodesis site hallux interphalangeal joint otherwise the first metatarsal osteotomy has gone on to heal uneventfully.  Plan: I will follow-up with him in a couple of months to reevaluate and re-x-ray.

## 2016-12-18 ENCOUNTER — Encounter: Payer: Self-pay | Admitting: Podiatry

## 2016-12-18 ENCOUNTER — Ambulatory Visit (INDEPENDENT_AMBULATORY_CARE_PROVIDER_SITE_OTHER): Payer: 59 | Admitting: Podiatry

## 2016-12-18 ENCOUNTER — Ambulatory Visit (INDEPENDENT_AMBULATORY_CARE_PROVIDER_SITE_OTHER): Payer: 59

## 2016-12-18 DIAGNOSIS — M2012 Hallux valgus (acquired), left foot: Secondary | ICD-10-CM | POA: Diagnosis not present

## 2016-12-18 NOTE — Progress Notes (Signed)
He presents today for follow-up of a hallux interphalangeal joint fusion by another doctor left hallux. He states that his notices swelling more frequently and mildly tender.  Objective: Vital signs are stable he is alert and oriented 3 the bunion portion of his procedure is going to heal uneventfully however he does have range of motion that is not acceptable at the level of the hallux interphalangeal joint. Radiographs demonstrate today fracture of the screw and a diastases of the attempted arthrodesis site.  Assessment: Failure of arthrodesis to heal hallux IP joint left. Complete healing of the capital osteotomy first metatarsal.  Plan: Discussed with him today the fact that we need to remove the screw and perform another arthrodesis. He understands this is amenable to it however he does not want to have this done until December of this year. I will follow-up with him in October for surgery consult unless he needs [place] earlier.

## 2017-04-23 ENCOUNTER — Ambulatory Visit (INDEPENDENT_AMBULATORY_CARE_PROVIDER_SITE_OTHER): Payer: 59 | Admitting: Podiatry

## 2017-04-23 ENCOUNTER — Encounter: Payer: Self-pay | Admitting: Podiatry

## 2017-04-23 DIAGNOSIS — T847XXD Infection and inflammatory reaction due to other internal orthopedic prosthetic devices, implants and grafts, subsequent encounter: Secondary | ICD-10-CM | POA: Diagnosis not present

## 2017-04-23 NOTE — Progress Notes (Signed)
He presents today for surgical consult regarding hallux malleus to his left great toe. He states the toe still sore and has failed to heal. He states it seems more swollen now than it did previously.  Objective: Vital signs are stable he is alert and oriented 3. Pulses are palpable. It is obvious that the toe is in slight plantarflexion is to wear it was sitting when I initially evaluated him. Radiographs taken today do demonstrate a nonunion of an attempted arthrodesis left hallux. It appears that the screw is too small and that there was not a good purchase at the fusion site. I did not see the screw prior to his presenting to our office and that the threads may have been across the joint at that time.  Assessment nonunion attempted IPJ arthrodesis hallux left with painful internal fixation.  Plan: At this point we signed a consent form today after discussing alignment Number by number giving him time to ask questions he saw fit regarding removal of painful screw and a an attempt to re-arthrodesis his hallux. We explained the larger screw be necessary and that more bone resection will also be necessary. He understands this and is amenable to it. We provided him with both oral and written home-going instructions as well as a kit to use prior to surgery. Follow up with him in the near future for surgical intervention. He states that he will still keep his screw after surgery.

## 2017-04-23 NOTE — Patient Instructions (Signed)
Pre-Operative Instructions  Congratulations, you have decided to take an important step towards improving your quality of life.  You can be assured that the doctors and staff at [facility] will be with you every step of the way.  Here are some important things you should know:  1. Plan to be at the surgery center/hospital at least 1 (one) hour prior to your scheduled time, unless otherwise directed by the surgical center/hospital staff.  You must have a responsible adult accompany you, remain during the surgery and drive you home.  Make sure you have directions to the surgical center/hospital to ensure you arrive on time. 2. If you are having surgery at [facility] or [facility], you will need a copy of your medical history and physical form from your family physician within one month prior to the date of surgery. We will give you a form for your primary physician to complete.  3. We make every effort to accommodate the date you request for surgery.  However, there are times where surgery dates or times have to be moved.  We will contact you as soon as possible if a change in schedule is required.   4. No aspirin/ibuprofen for one week before surgery.  If you are on aspirin, any non-steroidal anti-inflammatory medications (Mobic, Aleve, Ibuprofen) should not be taken seven (7) days prior to your surgery.  You make take Tylenol for pain prior to surgery.  5. Medications - If you are taking daily heart and blood pressure medications, seizure, reflux, allergy, asthma, anxiety, pain or diabetes medications, make sure you notify the surgery center/hospital before the day of surgery so they can tell you which medications you should take or avoid the day of surgery. 6. No food or drink after midnight the night before surgery unless directed otherwise by surgical center/hospital staff. 7. No alcoholic beverages 24-hours prior to surgery.  No smoking 24-hours prior or 24-hours after  surgery. 8. Wear loose pants or shorts. They should be loose enough to fit over bandages, boots, and casts. 9. Don't wear slip-on shoes. Sneakers are preferred. 10. Bring your boot with you to the surgery center/hospital.  Also bring crutches or a walker if your physician has prescribed it for you.  If you do not have this equipment, it will be provided for you after surgery. 11. If you have not been contacted by the surgery center/hospital by the day before your surgery, call to confirm the date and time of your surgery. 12. Leave-time from work may vary depending on the type of surgery you have.  Appropriate arrangements should be made prior to surgery with your employer. 13. Prescriptions will be provided immediately following surgery by your doctor.  Fill these as soon as possible after surgery and take the medication as directed. Pain medications will not be refilled on weekends and must be approved by the doctor. 14. Remove nail polish on the operative foot and avoid getting pedicures prior to surgery. 15. Wash the night before surgery.  The night before surgery wash the foot and leg well with water and the antibacterial soap provided. Be sure to pay special attention to beneath the toenails and in between the toes.  Wash for at least three (3) minutes. Rinse thoroughly with water and dry well with a towel.  Perform this wash unless told not to do so by your physician.  Enclosed: 1 Ice pack (please put in freezer the night before surgery)   1 Hibiclens skin cleaner     Pre-op instructions  If you have any questions regarding the instructions, please do not hesitate to call our office.  [place]: [address], [place], [place] [address] -- [phone]  [place]: [address] [place], [place] [address] -- [phone]  [place]: [address]  [place], [place] [address] -- [phone]  [place]: [address], [address], [place], [place] [address] -- [phone]  Website:  [email or web address] 

## 2017-05-01 ENCOUNTER — Telehealth: Payer: Self-pay | Admitting: *Deleted

## 2017-05-01 NOTE — Telephone Encounter (Signed)
"  Good morning, I'm a patient of Dr. Milinda Pointer.  I need to get some surgery scheduled.  Give me a call back.  I would appreciate it."

## 2017-05-02 NOTE — Telephone Encounter (Signed)
"  I'd like to schedule my surgery."  Do you have a date in mind?  "I'd like to do it 06/28/2017."  I'll get it scheduled, that date is available.  You can register at your convenience with the surgical center, instructions are in the brochure that you received.  Someone from the surgical center will give you a call a day or two prior to the surgery date with the arrival time.

## 2017-06-26 ENCOUNTER — Other Ambulatory Visit: Payer: Self-pay | Admitting: Podiatry

## 2017-06-26 MED ORDER — CEPHALEXIN 500 MG PO CAPS: 500.0000 mg | ORAL_CAPSULE | Freq: Three times a day (TID) | ORAL | 0 refills | Status: DC

## 2017-06-26 MED ORDER — PROMETHAZINE HCL 25 MG PO TABS: 25.0000 mg | ORAL_TABLET | Freq: Three times a day (TID) | ORAL | 0 refills | Status: DC | PRN

## 2017-06-26 MED ORDER — OXYCODONE-ACETAMINOPHEN 10-325 MG PO TABS: 1.0000 | ORAL_TABLET | ORAL | 0 refills | Status: DC | PRN

## 2017-06-28 ENCOUNTER — Encounter: Payer: Self-pay | Admitting: Podiatry

## 2017-06-28 DIAGNOSIS — M2042 Other hammer toe(s) (acquired), left foot: Secondary | ICD-10-CM | POA: Diagnosis not present

## 2017-06-28 DIAGNOSIS — Z4889 Encounter for other specified surgical aftercare: Secondary | ICD-10-CM | POA: Diagnosis not present

## 2017-07-04 ENCOUNTER — Ambulatory Visit: Payer: 59

## 2017-07-04 ENCOUNTER — Ambulatory Visit (INDEPENDENT_AMBULATORY_CARE_PROVIDER_SITE_OTHER): Payer: 59

## 2017-07-04 ENCOUNTER — Ambulatory Visit (INDEPENDENT_AMBULATORY_CARE_PROVIDER_SITE_OTHER): Payer: 59 | Admitting: Podiatry

## 2017-07-04 DIAGNOSIS — M2012 Hallux valgus (acquired), left foot: Secondary | ICD-10-CM

## 2017-07-04 NOTE — Progress Notes (Signed)
He presents today for his first postop visit he is status post repair of a broken screw with an attempted hallux interphalangeal joint fusion.  He states that he is doing very well as he refers to his left foot.  He has had minimal pain.  Objective: Vital signs are stable he is alert and oriented x3.  Pulses are palpable.  Dry sterile dressing was removed demonstrates a transverse incision distal aspect of the toe as well as the dorsal distal aspect of the toe.  There is no erythema no cellulitis drainage or odor mild edema.  Radiographs taken today demonstrate a good compression and juxtaposition of the distal and proximal phalanges of the hallux left.  Screw is in good position and within the bone.  Threads were past the joint and the screw is intact.  Assessment: Well-healing surgical foot right times 1 week.  Plan: Encouraged him to stay off of his foot is much as possible and to continue to wear his cam walker at all times.  He is not to ambulate without his Cam walker.  I redressed the foot today with a dry sterile compressive dressing.  Follow-up with him in 1 week at which time we hope to be able to remove some of the sutures.

## 2017-07-11 ENCOUNTER — Encounter: Payer: Self-pay | Admitting: Podiatry

## 2017-07-11 ENCOUNTER — Ambulatory Visit (INDEPENDENT_AMBULATORY_CARE_PROVIDER_SITE_OTHER): Payer: 59 | Admitting: Podiatry

## 2017-07-11 DIAGNOSIS — M2012 Hallux valgus (acquired), left foot: Secondary | ICD-10-CM

## 2017-07-11 NOTE — Progress Notes (Signed)
He presents today for follow-up of his hallux interphalangeal joint fusion left foot.States that he is doing very well with very little pain.  Objective: Sutures are intact hallux is mildly edematous.  Margins are well coapted yet so we will not remove the sutures.  Assessment: Well-healing surgical foot.  Plan: We will leave the sutures in for another week he will follow-up with [place] at that time for removal.  He is to continue to wear his cam walker at all times.

## 2017-07-18 ENCOUNTER — Encounter: Payer: Self-pay | Admitting: Podiatry

## 2017-07-18 ENCOUNTER — Ambulatory Visit (INDEPENDENT_AMBULATORY_CARE_PROVIDER_SITE_OTHER): Payer: 59 | Admitting: Podiatry

## 2017-07-18 DIAGNOSIS — T847XXD Infection and inflammatory reaction due to other internal orthopedic prosthetic devices, implants and grafts, subsequent encounter: Secondary | ICD-10-CM

## 2017-07-18 NOTE — Progress Notes (Signed)
He presents today for suture removal status post hallux interphalangeal joint fusion left.  He states that he is doing very well.  Objective: Remove the sutures today margins remain well coapted there is no signs of infection much decrease in edema.  No pain on range of motion of the toe.  Assessment: Well-healing arthrodesis at the level of the first metatarsal interphalangeal joint.  Plan: Redressed with compression dressing.  Follow-up with him in 2 weeks.  We will take x-rays at that point.

## 2017-08-01 ENCOUNTER — Ambulatory Visit (INDEPENDENT_AMBULATORY_CARE_PROVIDER_SITE_OTHER): Payer: 59

## 2017-08-01 ENCOUNTER — Ambulatory Visit (INDEPENDENT_AMBULATORY_CARE_PROVIDER_SITE_OTHER): Payer: 59 | Admitting: Podiatry

## 2017-08-01 DIAGNOSIS — M2012 Hallux valgus (acquired), left foot: Secondary | ICD-10-CM

## 2017-08-01 NOTE — Progress Notes (Signed)
He presents today for follow-up of his hallux interphalangeal joint fusion he states that is doing pretty well as some soreness of her put pressure on it there is some mild swelling.  Objective: Vital signs are stable he is alert and oriented x3 mild edema to the hallux left no erythema cellulitis drainage or odor mildly tender on attempted range of motion of the hallux interphalangeal joint.  Assessment: Well-healing surgical foot left.  Plan: I will follow-up with him in 1 month to 6 weeks for another set of x-rays.

## 2017-08-27 NOTE — Progress Notes (Signed)
[facility] 

## 2017-09-10 ENCOUNTER — Ambulatory Visit (INDEPENDENT_AMBULATORY_CARE_PROVIDER_SITE_OTHER): Payer: 59 | Admitting: Podiatry

## 2017-09-10 ENCOUNTER — Ambulatory Visit (INDEPENDENT_AMBULATORY_CARE_PROVIDER_SITE_OTHER): Payer: 59

## 2017-09-10 ENCOUNTER — Encounter: Payer: Self-pay | Admitting: Podiatry

## 2017-09-10 DIAGNOSIS — T847XXD Infection and inflammatory reaction due to other internal orthopedic prosthetic devices, implants and grafts, subsequent encounter: Secondary | ICD-10-CM | POA: Diagnosis not present

## 2017-09-10 DIAGNOSIS — M2012 Hallux valgus (acquired), left foot: Secondary | ICD-10-CM

## 2017-09-10 NOTE — Progress Notes (Signed)
He presents today for follow-up of his surgical foot IPJ fusion hallux left.  He states that is actually feeling pretty good date of surgery June 28, 2017.  Objective: Vital signs are stable he is alert and oriented x3.  Pulses are palpable.  Neurologic sensorium is intact.  Deep tendon reflexes are intact.  Muscle strength was 5/5 as a flexor plantar flexors inverters everters onto the musculature is intact.  Orthopedic evaluation demonstrates well-healing surgical toe decrease in edema no pain on palpation or range of motion scar tissue was a little tight on the transverse incision of the hallux interphalangeal joint but radiographically it appears to be's beginning to arthrodesed very nicely around the screw.  Assessment: Well-healing surgical foot hallux left.  Plan: We will allow him to get back to some low impact activity with tennis shoes and I will follow-up with him in 6 weeks hopefully we will be able to release him at that point.

## 2017-10-22 ENCOUNTER — Encounter: Payer: 59 | Admitting: Podiatry

## 2017-11-05 ENCOUNTER — Encounter: Payer: 59 | Admitting: Podiatry

## 2017-11-26 ENCOUNTER — Encounter: Payer: Self-pay | Admitting: Podiatry

## 2017-11-26 ENCOUNTER — Ambulatory Visit (INDEPENDENT_AMBULATORY_CARE_PROVIDER_SITE_OTHER): Payer: BLUE CROSS/BLUE SHIELD | Admitting: Podiatry

## 2017-11-26 ENCOUNTER — Ambulatory Visit (INDEPENDENT_AMBULATORY_CARE_PROVIDER_SITE_OTHER): Payer: BLUE CROSS/BLUE SHIELD

## 2017-11-26 DIAGNOSIS — T847XXD Infection and inflammatory reaction due to other internal orthopedic prosthetic devices, implants and grafts, subsequent encounter: Secondary | ICD-10-CM

## 2017-11-26 NOTE — Progress Notes (Signed)
He presents today for follow-up of his hallux interphalangeal joint arthrodesis left foot.  He states that is doing much better he is back to training.  He still has some tenderness.  Still walks with a bit of a limp he says.  Objective: Vital signs are stable he is alert and oriented x3.  Presents today light antalgic gait left side.  Evaluated the foot pulses are palpable.  Minimal edema about the surgical site.  Incision has completely healed radiographs demonstrate nearly 100% arthrodesed joint.  Assessment: Well-healing surgical foot.  Plan: I will follow-up with him in 3 months for radiographic evaluation to confirm complete consolidation.

## 2018-02-20 ENCOUNTER — Ambulatory Visit (INDEPENDENT_AMBULATORY_CARE_PROVIDER_SITE_OTHER): Payer: BLUE CROSS/BLUE SHIELD

## 2018-02-20 ENCOUNTER — Ambulatory Visit: Payer: BLUE CROSS/BLUE SHIELD | Admitting: Podiatry

## 2018-02-20 ENCOUNTER — Encounter: Payer: Self-pay | Admitting: Podiatry

## 2018-02-20 DIAGNOSIS — M2012 Hallux valgus (acquired), left foot: Secondary | ICD-10-CM | POA: Diagnosis not present

## 2018-02-20 DIAGNOSIS — T847XXD Infection and inflammatory reaction due to other internal orthopedic prosthetic devices, implants and grafts, subsequent encounter: Secondary | ICD-10-CM

## 2018-02-22 NOTE — Progress Notes (Signed)
He presents today for his final postop visit date of surgery 06/28/2017 status post hallux interphalangeal joint fusion as a repair.  He states that is doing good he is having no problems with it whatsoever.  Objective: Vital signs are stable he is alert and oriented x3.  Pulses are palpable.  His great range of motion of the first metatarsophalangeal joint no range of motion at the hallux interphalangeal joint.  Radiographs demonstrate complete fusion of the hallux interphalangeal joint with internal fixation being maintained in good position.  Assessment: Well-healing surgical foot.  Plan: Follow-up with me on an as-needed basis.

## 2018-05-22 ENCOUNTER — Telehealth: Payer: Self-pay | Admitting: Podiatry

## 2018-05-22 NOTE — Telephone Encounter (Signed)
Called and left voicemail letting pt know I'm working on catching up on medical records and once I have completed it I will call him unless he wanted them mailed or faxed. Told him to call us back with any other questions/concerns at [phone].

## 2018-05-22 NOTE — Telephone Encounter (Signed)
I was there last Monday and signed a release form to obtain my medical records. Please call me back at [phone].

## 2018-06-03 ENCOUNTER — Telehealth: Payer: Self-pay | Admitting: Podiatry

## 2018-06-03 NOTE — Telephone Encounter (Signed)
Called pt to let him know his medical records were ready. Pt stated he would be by tomorrow morning to pick them up.

## 2018-09-11 DIAGNOSIS — I10 Essential (primary) hypertension: Secondary | ICD-10-CM | POA: Insufficient documentation

## 2018-09-11 DIAGNOSIS — E0590 Thyrotoxicosis, unspecified without thyrotoxic crisis or storm: Secondary | ICD-10-CM | POA: Insufficient documentation

## 2018-09-11 DIAGNOSIS — E119 Type 2 diabetes mellitus without complications: Secondary | ICD-10-CM | POA: Insufficient documentation

## 2019-05-12 DIAGNOSIS — H2513 Age-related nuclear cataract, bilateral: Secondary | ICD-10-CM | POA: Insufficient documentation

## 2019-05-12 DIAGNOSIS — Q140 Congenital malformation of vitreous humor: Secondary | ICD-10-CM | POA: Insufficient documentation

## 2019-06-24 ENCOUNTER — Other Ambulatory Visit: Payer: Self-pay

## 2019-06-24 DIAGNOSIS — Z20822 Contact with and (suspected) exposure to covid-19: Secondary | ICD-10-CM

## 2019-06-26 LAB — NOVEL CORONAVIRUS, NAA: SARS-CoV-2, NAA: DETECTED — AB

## 2019-06-27 ENCOUNTER — Telehealth: Payer: Self-pay | Admitting: Nurse Practitioner

## 2019-06-27 ENCOUNTER — Encounter: Payer: Self-pay | Admitting: Nurse Practitioner

## 2019-06-27 DIAGNOSIS — Z6836 Body mass index (BMI) 36.0-36.9, adult: Secondary | ICD-10-CM | POA: Insufficient documentation

## 2019-06-27 NOTE — Telephone Encounter (Signed)
Attempt to call to discuss with patient about Covid symptoms and the use of bamlanivimab, a monoclonal antibody infusion for those with mild to moderate Covid symptoms and at a high risk of hospitalization.  Pt is qualified for this infusion at the [facility] infusion center due to age > 56 with T2DM and obesity.  No answer and general HIPAA compliant message left.

## 2020-04-21 DIAGNOSIS — Z860100 Personal history of colon polyps, unspecified: Secondary | ICD-10-CM | POA: Insufficient documentation

## 2021-07-25 ENCOUNTER — Ambulatory Visit (INDEPENDENT_AMBULATORY_CARE_PROVIDER_SITE_OTHER): Payer: 59 | Admitting: Orthopedic Surgery

## 2021-07-25 ENCOUNTER — Encounter: Payer: Self-pay | Admitting: Orthopedic Surgery

## 2021-07-25 ENCOUNTER — Other Ambulatory Visit: Payer: Self-pay

## 2021-07-25 DIAGNOSIS — M25461 Effusion, right knee: Secondary | ICD-10-CM | POA: Diagnosis not present

## 2021-07-25 DIAGNOSIS — M25561 Pain in right knee: Secondary | ICD-10-CM | POA: Diagnosis not present

## 2021-07-25 NOTE — Progress Notes (Signed)
Office Visit Note   Patient: Jeffery [name]           Date of Birth: 1961/04/16           MRN: [id number] Visit Date: 07/25/2021 Requested by: Jeffery [name], [place],  [place] [address] PCP: Jeffery Lass, MD  Subjective: Chief Complaint  Patient presents with   Right Knee - Pain    HPI: Jeffery [name] is a 61 y.o. male who presents to the office complaining of right knee pain.  Patient has a history of right knee injury where he states that his right knee buckled on him while descending stairs back in November 2022.  Did not really bother him that day of injury but the following day he noticed swelling and increased pain that progressed to the point where he was seen by sports medical provider and had knee aspiration and injection after x-rays were negative for fracture or acute injury.  This injection did well for him from mid November up until last Saturday when he started to notice fairly acute pain on Saturday that has kept him up at night and made it difficult to sleep.  It is also progressed over the last couple days to the point where he has difficulty weightbearing and has to weight-bear with crutches.  He localizes pain to the posterior and medial aspects of the knee primarily he has difficulty lifting his leg and achieving full extension even passively.  He does have history of gout but has not had a gout attack since 2017.  Does not take allopurinol or colchicine or any similar medications for gout.  No history of knee surgery.  Denies any groin pain, radicular pain.  No clicking sensation in the knee..                ROS: All systems reviewed are negative as they relate to the chief complaint within the history of present illness.  Patient denies fevers or chills.  Assessment & Plan: Visit Diagnoses:  1. Right knee pain, unspecified chronicity   2. Effusion, right knee     Plan: Patient is a 61 year old male who presents for evaluation of right knee  pain.  He had an injury in November where his knee buckled going downstairs.  Did not notice swelling until the following day.  An injection at that time that helped with his symptoms up until fairly acute return of pain last Saturday.  This feels similar to how his knee felt following his injury.  X-ray report from right knee radiographs back in November 2022 demonstrates moderate patellofemoral arthritis and mild medial/lateral compartment arthritis without any acute fracture/dislocation.  No recent injury.  Differential diagnosis mainly includes acute gout attack versus meniscal pathology.  Knee was aspirated of about 15 cc of synovial fluid that did not appear purulent and did not appear like classic gout aspirate.  Plan to send the fluid off for synovial fluid analysis and order MRI of the right knee for further evaluation.  Follow-up after MRI.  Right knee was injected with Toradol today.  MRI was ordered urgently due to patient's difficulty weightbearing and getting around as well as lack of passive extension today.  Follow-Up Instructions: No follow-ups on file.   Orders:  No orders of the defined types were placed in this encounter.  No orders of the defined types were placed in this encounter.     Procedures: Large Joint Inj: R knee on 07/25/2021 8:38 PM Indications:  diagnostic evaluation, joint swelling and pain Details: 18 G 1.5 in needle, superolateral approach  Arthrogram: No  Medications: 5 mL lidocaine 1 %; 4 mL bupivacaine 0.25 % (Bupivacaine was combined with 1 cc of Toradol) Aspirate: 15 mL yellow Outcome: tolerated well, no immediate complications Procedure, treatment alternatives, risks and benefits explained, specific risks discussed. Consent was given by the patient. Immediately prior to procedure a time out was called to verify the correct patient, procedure, equipment, support staff and site/side marked as required. Patient was prepped and draped in the usual sterile  fashion.      Clinical Data: No additional findings.  Objective: Vital Signs: There were no vitals taken for this visit.  Physical Exam:  Constitutional: Patient appears well-developed HEENT:  Head: Normocephalic Eyes:EOM are normal Neck: Normal range of motion Cardiovascular: Normal rate Pulmonary/chest: Effort normal Neurologic: Patient is alert Skin: Skin is warm Psychiatric: Patient has normal mood and affect  Ortho Exam: Ortho exam demonstrates right knee with positive effusion.  Tenderness over the medial joint line moderately and lateral joint line mildly.  There is warmth diffusely throughout the knee.  He extends to 10 degrees but cannot really extend past that point actively or passively.  He has pain with extending his leg actively but there is no defect noted to the quad tendon or patellar tendons by palpation.  No pain with hip range of motion.  No calf tenderness.  Negative Homans' sign.  Stable to Lachman exam.  Flexes to 80 degrees but cannot flex much further without pain.  Specialty Comments:  No specialty comments available.  Imaging: No results found.   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   Nuclear sclerotic cataract of both eyes 05/12/2019   Diabetes mellitus ([place]) 09/11/2018   Hypertension 09/11/2018   Hyperthyroidism 09/11/2018   Lipoma of abdominal wall 02/12/2012   Past Medical History:  Diagnosis Date   Anal fissure    Diabetes mellitus without complication ([place])    Fatty liver    Hyperplastic rectal polyp    Hypertension    Lipoma     Family History  Problem Relation Age of Onset   Diabetes Mother    Diabetes Father     Past Surgical History:  Procedure Laterality Date   CERVICAL [place] ARTHROPLASTY  2007   C-5 and C-6   SHOULDER SURGERY  2008 and 2012   left in 2008, right had rtc tear 2012   Social  History   Occupational History   Not on file  Tobacco Use   Smoking status: Never   Smokeless tobacco: Never  Substance and Sexual Activity   Alcohol use: No   Drug use: No   Sexual activity: Not on file

## 2021-07-26 ENCOUNTER — Telehealth: Payer: Self-pay | Admitting: Orthopedic Surgery

## 2021-07-26 MED ORDER — LIDOCAINE HCL 1 % IJ SOLN
5.0000 mL | INTRAMUSCULAR | Status: AC | PRN
  Administered 2021-07-25: 5 mL

## 2021-07-26 MED ORDER — BUPIVACAINE HCL 0.25 % IJ SOLN
4.0000 mL | INTRAMUSCULAR | Status: AC | PRN
  Administered 2021-07-25: 4 mL via INTRA_ARTICULAR

## 2021-07-26 NOTE — Telephone Encounter (Signed)
Pt called requesting pain medication. Pt states the fluid off the knee relieved the pain some but he still is experiencing lots of pain in the right knee. Please send script to [facility] on [address]. Pt phone number is [phone].

## 2021-07-27 ENCOUNTER — Telehealth: Payer: Self-pay | Admitting: Surgical

## 2021-07-27 ENCOUNTER — Other Ambulatory Visit: Payer: Self-pay | Admitting: Surgical

## 2021-07-27 MED ORDER — TRAMADOL HCL 50 MG PO TABS: 50.0000 mg | ORAL_TABLET | Freq: Two times a day (BID) | ORAL | 0 refills | Status: DC | PRN

## 2021-07-27 NOTE — Telephone Encounter (Signed)
Contacted patient and made him aware, also pharmacy has been updated to [facility] as patient requested.

## 2021-07-27 NOTE — Telephone Encounter (Signed)
Patient called asked if Rx for pain medicine was sent to his pharmacy. I advised patient the message was forwarded to [facility]. Patient asked if he can get a call when Rx is sent to the pharmacy. Please see previous note.    The number to contact patient is [phone]

## 2021-07-27 NOTE — Telephone Encounter (Signed)
Sent it in, thanks

## 2021-07-27 NOTE — Telephone Encounter (Signed)
Patient is called as second attempt for medication refill. Please advise when this is completed and I will contact the patient and make them aware that this has been sent to his pharmacy.

## 2021-07-28 ENCOUNTER — Ambulatory Visit (HOSPITAL_COMMUNITY)
Admission: RE | Admit: 2021-07-28 | Discharge: 2021-07-28 | Disposition: A | Discharge status: Home / Self Care | Payer: 59 | Source: Ambulatory Visit | Attending: Surgical | Admitting: Surgical

## 2021-07-28 ENCOUNTER — Other Ambulatory Visit: Payer: Self-pay

## 2021-07-28 DIAGNOSIS — M25561 Pain in right knee: Secondary | ICD-10-CM | POA: Insufficient documentation

## 2021-07-31 LAB — ANAEROBIC AND AEROBIC CULTURE
AER RESULT:: NO GROWTH
MICRO NUMBER:: 12820640
MICRO NUMBER:: 12820641
SPECIMEN QUALITY:: ADEQUATE
SPECIMEN QUALITY:: ADEQUATE

## 2021-07-31 LAB — SYNOVIAL FLUID ANALYSIS, COMPLETE
Basophils, %: 0 %
Eosinophils-Synovial: 0 % (ref 0–2)
Lymphocytes-Synovial Fld: 8 % (ref 0–74)
Monocyte/Macrophage: 6 % (ref 0–69)
Neutrophil, Synovial: 86 % — ABNORMAL HIGH (ref 0–24)
Synoviocytes, %: 0 % (ref 0–15)
WBC, Synovial: 1708 cells/uL — ABNORMAL HIGH (ref <150)

## 2021-08-02 NOTE — Progress Notes (Signed)
Called Jeffery [name] to update him about his synovial fluid analysis and knee MRI.  No crystals in the synovial fluid.  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  He is feeling better but not perfect from the Toradol injection.  Following up on 08/11/2021 and plan to try cortisone injection.  He agreed with this plan.

## 2021-08-11 ENCOUNTER — Other Ambulatory Visit: Payer: Self-pay

## 2021-08-11 ENCOUNTER — Ambulatory Visit (INDEPENDENT_AMBULATORY_CARE_PROVIDER_SITE_OTHER): Payer: 59 | Admitting: Surgical

## 2021-08-11 ENCOUNTER — Ambulatory Visit (INDEPENDENT_AMBULATORY_CARE_PROVIDER_SITE_OTHER): Payer: 59

## 2021-08-11 DIAGNOSIS — M25571 Pain in right ankle and joints of right foot: Secondary | ICD-10-CM

## 2021-08-13 ENCOUNTER — Encounter: Payer: Self-pay | Admitting: Surgical

## 2021-08-13 NOTE — Progress Notes (Signed)
Office Visit Note   Patient: Jeffery [name]           Date of Birth: 28-May-1961           MRN: [id number] Visit Date: 08/11/2021 Requested by: Kathyrn Lass, [place],  [place] [address] PCP: Kathyrn Lass, MD  Subjective: Chief Complaint  Patient presents with   Other    Scan review    HPI: Jeffery [name] is a 61 y.o. male who presents to the office for MRI review.  Patient states that his right knee is feeling significantly better.  He notes no pain with range of motion of his knee.  Pain is now waking him up at night or getting in the way of ambulation.  Overall he feels he has pretty much returned to his baseline as far as his knee pain goes.  Denies any mechanical symptoms of the knee.  Swelling has resolved since the aspiration and injection.  His main complaint today is ankle swelling of the right lower extremity.  He has noticed this for several months.  Denies any history of injury to his ankle.  He has no ankle pain at this time.  Ankle is not giving out on him when he walks or causing him discomfort when he ambulates.  He has no history of having to take any medication by his PCP for pedal edema.  MRI results revealed: MR Knee Right w/o contrast  Result Date: 07/28/2021 CLINICAL DATA:  Blocked extension.  Pain.  Knee buckled 06/01/2021. EXAM: MRI OF THE RIGHT KNEE WITHOUT CONTRAST TECHNIQUE: Multiplanar, multisequence MR imaging of the knee was performed. No intravenous contrast was administered. COMPARISON:  None available FINDINGS: Mild motion artifact. MENISCI Medial meniscus:  Intact. Lateral meniscus:  Intact. LIGAMENTS Cruciates: The ACL and PCL are intact. Collaterals: The medial collateral ligament is intact. The fibular collateral ligament, biceps femoris tendon, iliotibial band, and popliteus tendon are intact. CARTILAGE Patellofemoral: There is full-thickness cartilage loss within the lateral trochlea region measuring up to 15 mm in craniocaudal  dimension with moderate subchondral cystic change and surrounding marrow edema. Medial:  Intact. Lateral: Mild surface irregularity of the posterior lateral tibial plateau cartilage. Joint: Mild joint effusion.Normal Hoffa's fat pad. No plical thickening. Popliteal Fossa:  No Baker's cyst. Extensor Mechanism:  Intact quadriceps tendon and patellar tendon. Bones:  No acute fracture or dislocation. Other: Mild medial subcutaneous fat soft tissue swelling and edema. IMPRESSION:: IMPRESSION: 1. 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 2. Intact cruciate ligaments, collateral ligaments, and menisci. 3. Mild joint effusion. Electronically Signed   By: Yvonne Kendall   On: 07/28/2021 17:11                 ROS: All systems reviewed are negative as they relate to the chief complaint within the history of present illness.  Patient denies fevers or chills.  Assessment & Plan: Visit Diagnoses:  1. Pain in right ankle and joints of right foot     Plan: Jeffery [name] is a 61 y.o. male who presents to the office for MRI review of the right knee.  MRI demonstrated no ligamentous or meniscal injury but did show full-thickness cartilage loss within the lateral trochlea with subchondral cyst formation.  No operative pathology in the right knee at this time.  He has near 100% symptomatic improvement with aspiration and injection of Toradol.  Offered cortisone injection today as we discussed on the  phone 1 to 2 weeks ago but he declined and will save this if his knee pain flares up in the future.  Aspiration was negative for gout.  He does note some swelling of the right ankle and very distal aspect of his calf.  He has slight pitting edema over the distal shin on exam today.  He really has no discomfort or tenderness on exam save for very slight tenderness over the ATFL.  This swelling he states has been there for several months.  No sign of DVT on exam  today.  Impression is residual swelling from remote ankle sprain back when he injured his knee in November of last year versus medical reason for leg swelling such as venous insufficiency.  He is scheduled to see his PCP next week so recommended he bring it up with him and if there is no clear medical cause for the swelling, he will return to the office for further evaluation and consideration of MRI scan if he is having significant discomfort at that time.  Follow-Up Instructions: No follow-ups on file.   Orders:  Orders Placed This Encounter  Procedures   XR Ankle Complete Right   No orders of the defined types were placed in this encounter.     Procedures: No procedures performed   Clinical Data: No additional findings.  Objective: Vital Signs: There were no vitals taken for this visit.  Physical Exam:  Constitutional: Patient appears well-developed HEENT:  Head: Normocephalic Eyes:EOM are normal Neck: Normal range of motion Cardiovascular: Normal rate Pulmonary/chest: Effort normal Neurologic: Patient is alert Skin: Skin is warm Psychiatric: Patient has normal mood and affect  Ortho Exam: Ortho exam demonstrates right ankle with palpable pedal pulses.  Intact dorsiflexion, plantarflexion, inversion, eversion.  Very minimal tenderness rated 1/10 over the ATFL with no tenderness over the CFL, deltoid ligament, Lisfranc complex, fifth metatarsal base, lateral malleolus, medial malleolus, Achilles tendon, Achilles tendon insertion.  Achilles tendon and anterior tibialis tendons are palpable and intact.  He does have mild to moderate swelling primarily over the lateral and medial aspect of the ankle.  No calf tenderness on exam.  Negative Homans' sign.  No knee effusion is noted.  He has very minimal tenderness over the medial and lateral joint lines.  Full range of motion from 0 to 115 degrees of the right knee.  Able to perform straight leg raise.  Ambulates without  antalgia.  Specialty Comments:  No specialty comments available.  Imaging: No results found.   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   Nuclear sclerotic cataract of both eyes 05/12/2019   Diabetes mellitus ([place]) 09/11/2018   Hypertension 09/11/2018   Hyperthyroidism 09/11/2018   Lipoma of abdominal wall 02/12/2012   Past Medical History:  Diagnosis Date   Anal fissure    Diabetes mellitus without complication ([place])    Fatty liver    Hyperplastic rectal polyp    Hypertension    Lipoma     Family History  Problem Relation Age of Onset   Diabetes Mother    Diabetes Father     Past Surgical History:  Procedure Laterality Date   CERVICAL [place] ARTHROPLASTY  2007   C-5 and C-6   SHOULDER SURGERY  2008 and 2012   left in 2008, right had rtc tear 2012   Social History   Occupational History   Not on  file  Tobacco Use   Smoking status: Never   Smokeless tobacco: Never  Substance and Sexual Activity   Alcohol use: No   Drug use: No   Sexual activity: Not on file

## 2021-11-08 ENCOUNTER — Encounter: Payer: Self-pay | Admitting: Internal Medicine

## 2021-11-08 ENCOUNTER — Ambulatory Visit (INDEPENDENT_AMBULATORY_CARE_PROVIDER_SITE_OTHER): Payer: 59 | Admitting: Internal Medicine

## 2021-11-08 VITALS — BP 126/80 | HR 100 | Ht 73.0 in | Wt 250.0 lb

## 2021-11-08 DIAGNOSIS — R748 Abnormal levels of other serum enzymes: Secondary | ICD-10-CM

## 2021-11-08 DIAGNOSIS — E0590 Thyrotoxicosis, unspecified without thyrotoxic crisis or storm: Secondary | ICD-10-CM | POA: Diagnosis not present

## 2021-11-08 LAB — CBC WITH DIFFERENTIAL/PLATELET
Basophils Absolute: 0 10*3/uL (ref 0.0–0.1)
Basophils Relative: 0.3 % (ref 0.0–3.0)
Eosinophils Absolute: 0 10*3/uL (ref 0.0–0.7)
Eosinophils Relative: 0.5 % (ref 0.0–5.0)
HCT: 43.9 % (ref 39.0–52.0)
Hemoglobin: 14.3 g/dL (ref 13.0–17.0)
Lymphocytes Relative: 36.5 % (ref 12.0–46.0)
Lymphs Abs: 2.8 10*3/uL (ref 0.7–4.0)
MCHC: 32.7 g/dL (ref 30.0–36.0)
MCV: 77.4 fl — ABNORMAL LOW (ref 78.0–100.0)
Monocytes Absolute: 0.7 10*3/uL (ref 0.1–1.0)
Monocytes Relative: 8.4 % (ref 3.0–12.0)
Neutro Abs: 4.2 10*3/uL (ref 1.4–7.7)
Neutrophils Relative %: 54.3 % (ref 43.0–77.0)
Platelets: 290 10*3/uL (ref 150.0–400.0)
RBC: 5.67 Mil/uL (ref 4.22–5.81)
RDW: 14.9 % (ref 11.5–15.5)
WBC: 7.8 10*3/uL (ref 4.0–10.5)

## 2021-11-08 LAB — COMPREHENSIVE METABOLIC PANEL
ALT: 29 U/L (ref 0–53)
AST: 18 U/L (ref 0–37)
Albumin: 4.2 g/dL (ref 3.5–5.2)
Alkaline Phosphatase: 167 U/L — ABNORMAL HIGH (ref 39–117)
BUN: 19 mg/dL (ref 6–23)
CO2: 29 mEq/L (ref 19–32)
Calcium: 9.6 mg/dL (ref 8.4–10.5)
Chloride: 104 mEq/L (ref 96–112)
Creatinine, Ser: 1.11 mg/dL (ref 0.40–1.50)
GFR: 71.3 mL/min (ref >60.00)
Glucose, Bld: 149 mg/dL — ABNORMAL HIGH (ref 70–99)
Potassium: 4.1 mEq/L (ref 3.5–5.1)
Sodium: 143 mEq/L (ref 135–145)
Total Bilirubin: 0.4 mg/dL (ref 0.2–1.2)
Total Protein: 7.4 g/dL (ref 6.0–8.3)

## 2021-11-08 LAB — TSH: TSH: <0.01 u[IU]/mL (ref 0.35–5.50)

## 2021-11-08 LAB — T4, FREE: Free T4: 1.52 ng/dL (ref 0.60–1.60)

## 2021-11-08 NOTE — Progress Notes (Signed)
? ? ?Name: Jeffery [name]  ?MRN/ DOB: [id number], 03-Jun-1961    ?Age/ Sex: 61 y.o., male   ? ?PCP: Kathyrn Lass, MD   ?Reason for Endocrinology Evaluation: Hyperthyroidism   ?   ?Date of Initial Endocrinology Evaluation: 11/08/2021   ? ? ?HPI: ?Jeffery [name] is a 60 y.o. male with a past medical history of T2DM, dyslipidemia and DJD. The patient presented for initial endocrinology clinic visit on 11/08/2021 for consultative assistance with his Hyperthyroidism .  ? ? ?He was diagnosed with hyperthyroidism in September 2019, he was noted to have abnormal thyroid hormone levels during routine testing, he was subsequently seen by endocrinologist at [facility] and was started on methimazole at the time.  He eventually transitioned care to [facility] with his last visit in 2021. ? ? ?The patient has been off methimazole since 2021 until his return to his PCP in January 2023 when he was noted with palpitations and a slight tremor of her finger and that is when he was diagnosed with hyperthyroidism again and was restarted on methimazole ? ? ?Weight has been stable  ?Denies palpitations  ?Denies diarrhea or loose stools  ?Denies tremors , had tremors in January  ?Denies local neck swelling  ?Denies anxiety and irritability  ? ? ? ?Methimazole 10 mg daily  ? ?Mother with Berenice Primas' disease  ? ? ? ? ? ?HISTORY:  ?Past Medical History:  ?Past Medical History:  ?Diagnosis Date  ? Anal fissure   ? Diabetes mellitus without complication ([place])   ? Fatty liver   ? Hyperplastic rectal polyp   ? Hypertension   ? Lipoma   ? ?Past Surgical History:  ?Past Surgical History:  ?Procedure Laterality Date  ? [place] ARTHROPLASTY  2007  ? C-5 and C-6  ? SHOULDER SURGERY  2008 and 2012  ? left in 2008, right had rtc tear 2012  ?  ?Social History:  reports that he has never smoked. He has never used smokeless tobacco. He reports that he does not drink alcohol and does not use drugs. ?Family History: family  history includes Diabetes in his father and mother. ? ? ?HOME MEDICATIONS: ?Allergies as of 11/08/2021   ? ?   Reactions  ? Other Hives  ? ONLY ORAL steroids. Hives on upper torso only.  ? Nsaids Hives  ? ?  ? ?  ?Medication List  ?  ? ?  ? Accurate as of November 08, 2021  7:38 AM. If you have any questions, ask your nurse or doctor.  ?  ?  ? ?  ? ?amLODipine 5 MG tablet ?Commonly known as: NORVASC ?Take 5 mg by mouth daily. ?  ?CO Q 10 PO ?Take 1 tablet by mouth daily. ?  ?indomethacin 25 MG capsule ?Commonly known as: INDOCIN ?take 1-2 capsules by mouth three times a day if needed for JOINT PAIN ?  ?losartan-hydrochlorothiazide 100-25 MG tablet ?Commonly known as: HYZAAR ?Take 1 tablet by mouth daily. ?  ?omega-3 acid ethyl esters 1 g capsule ?Commonly known as: LOVAZA ?Take 1 g by mouth daily. ?  ?traMADol 50 MG tablet ?Commonly known as: Ultram ?Take 1 tablet (50 mg total) by mouth every 12 (twelve) hours as needed. ?  ?valsartan-hydrochlorothiazide 320-25 MG tablet ?Commonly known as: DIOVAN-HCT ?Take 1 tablet by mouth daily. ?  ? ?  ?  ? ? ?REVIEW OF SYSTEMS: ?A comprehensive ROS was conducted with the patient and is negative except as per HPI ? ? ? ?  OBJECTIVE:  ?VS: BP 126/80 (BP Location: Left Arm, Patient Position: Sitting, Cuff Size: Large)   Pulse 100   Ht '6\' 1"'$  (1.854 m)   Wt 250 lb (113.4 kg)   SpO2 99%   BMI 32.98 kg/m?  ? ? ?Wt Readings from Last 3 Encounters:  ?05/12/15 270 lb 1.6 oz (122.5 kg)  ?06/19/13 258 lb (117 kg)  ?02/12/12 279 lb (126.6 kg)  ? ? ? ?EXAM: ?General: Pt appears well and is in NAD  ?Eyes: External eye exam normal without stare, lid lag or exophthalmos.  EOM intact.  PERRL.  ?Neck: General: Supple without adenopathy. ?Thyroid: Thyroid size normal.  No goiter or nodules appreciated. No thyroid bruit.  ?Lungs: Clear with good BS bilat with no rales, rhonchi, or wheezes  ?Heart: Auscultation: RRR.  ?Abdomen: Normoactive bowel sounds, soft, nontender, without masses or organomegaly  palpable  ?Extremities: Mild left hand tremors  ?BL LE: No pretibial edema normal ROM and strength.  ?Mental Status: Judgment, insight: Intact ?Orientation: Oriented to time, place, and person ?Mood and affect: No depression, anxiety, or agitation  ? ? ? ?DATA REVIEWED: ? ?  ? Latest Reference Range & Units 11/08/21 12:03  ?Sodium 135 - 145 mEq/L 143  ?Potassium 3.5 - 5.1 mEq/L 4.1  ?Chloride 96 - 112 mEq/L 104  ?CO2 19 - 32 mEq/L 29  ?Glucose 70 - 99 mg/dL 149 (H)  ?BUN 6 - 23 mg/dL 19  ?Creatinine 0.40 - 1.50 mg/dL 1.11  ?Calcium 8.4 - 10.5 mg/dL 9.6  ?Alkaline Phosphatase 39 - 117 U/L 167 (H)  ?Albumin 3.5 - 5.2 g/dL 4.2  ?AST 0 - 37 U/L 18  ?ALT 0 - 53 U/L 29  ?Total Protein 6.0 - 8.3 g/dL 7.4  ?Total Bilirubin 0.2 - 1.2 mg/dL 0.4  ?GFR >60.00 mL/min 71.30  ?WBC 4.0 - 10.5 K/uL 7.8  ?RBC 4.22 - 5.81 Mil/uL 5.67  ?Hemoglobin 13.0 - 17.0 g/dL 14.3  ?HCT 39.0 - 52.0 % 43.9  ?MCV 78.0 - 100.0 fl 77.4 (L)  ?MCHC 30.0 - 36.0 g/dL 32.7  ?RDW 11.5 - 15.5 % 14.9  ?Platelets 150.0 - 400.0 K/uL 290.0  ?Neutrophils 43.0 - 77.0 % 54.3  ?Lymphocytes 12.0 - 46.0 % 36.5  ?Monocytes Relative 3.0 - 12.0 % 8.4  ?Eosinophil 0.0 - 5.0 % 0.5  ?Basophil 0.0 - 3.0 % 0.3  ?NEUT# 1.4 - 7.7 K/uL 4.2  ?Lymphocyte # 0.7 - 4.0 K/uL 2.8  ?Monocyte # 0.1 - 1.0 K/uL 0.7  ?Eosinophils Absolute 0.0 - 0.7 K/uL 0.0  ?Basophils Absolute 0.0 - 0.1 K/uL 0.0  ?TSH 0.35 - 5.50 uIU/mL <0.01  ?Triiodothyronine (T3) 76 - 181 ng/dL 229 (H)  ?T4,Free(Direct) 0.60 - 1.60 ng/dL 1.52  ? ? ?ASSESSMENT/PLAN/RECOMMENDATIONS:  ? ?Hyperthyroidism: ? ?-We discussed the differential diagnosis of Graves' disease versus toxic thyroid nodules ?-Given his clinical history it appears that his clinical scenario is more consistent with Graves' disease ?-We discussed that Graves' Disease is a result of an autoimmune condition involving the thyroid.  ? ? ?-We discussed with pt the benefits of methimazole in the Tx of hyperthyroidism, as well as the possible side  effects/complications of anti-thyroid drug Tx (specifically detailing the rare, but serious side effect of agranulocytosis). He was informed of need for regular thyroid function monitoring while on methimazole to ensure appropriate dosage without over-treatment. As well, we discussed the possible side effects of methimazole including the chance of rash, the small chance of liver irritation/juandice and the <=1 in  300-400 chance of sudden onset agranulocytosis.  We discussed importance of going to ED promptly (and stopping methimazole) if hewere to develop significant fever with severe sore throat of other evidence of acute infection.    ? ? ?We extensively discussed the various treatment options for hyperthyroidism and Graves disease including ablation therapy with radioactive iodine versus antithyroid drug treatment versus surgical therapy.   ?I carefully explained to the patient that one of the consequences of I-131 ablation treatment would likely be permanent hypothyroidism which would require long-term replacement therapy with LT4. ?-The patient is interested in continuing with thionamides therapy ?-Labs today continues to show suppressed TSH, we will increase methimazole as below ?-TRAb pending ? ?Medications : ?Increase methimazole 10 mg, 1.5 tabs daily ? ? ?2.  Elevated alkaline phosphatase: ? ? ?-This could be due to thionamides therapy versus hyperthyroidism.  We will continue to monitor at this time ? ?Follow-up in 4 months ? ?Signed electronically by: ?Abby Nena Jordan, MD ? ?[facility]  ?[facility] ?[address] ?[place],  [address] ?Phone: [phone] ?FAX: [phone] ? ? ?CC: ?Kathyrn Lass, MD ?[place] ?[place] [address] ?Phone: [phone] ?Fax: [phone] ? ? ?Return to Endocrinology clinic as below: ?Future Appointments  ?Date Time Provider [place]  ?11/08/2021 12:00 PM Akiera Allbaugh, Melanie Crazier, MD [facility] None  ?   ? ? ? ? ? ?

## 2021-11-09 DIAGNOSIS — R748 Abnormal levels of other serum enzymes: Secondary | ICD-10-CM | POA: Insufficient documentation

## 2021-11-09 MED ORDER — METHIMAZOLE 10 MG PO TABS: 15.0000 mg | ORAL_TABLET | Freq: Every day | ORAL | 1 refills | Status: DC

## 2021-11-11 LAB — T3: T3, Total: 229 ng/dL — ABNORMAL HIGH (ref 76–181)

## 2021-11-11 LAB — TRAB (TSH RECEPTOR BINDING ANTIBODY): TRAB: 10.94 IU/L — ABNORMAL HIGH (ref <=2.00)

## 2021-11-18 ENCOUNTER — Emergency Department (HOSPITAL_BASED_OUTPATIENT_CLINIC_OR_DEPARTMENT_OTHER)
Admission: EM | Admit: 2021-11-18 | Discharge: 2021-11-18 | Disposition: A | Discharge status: Home / Self Care | Payer: 59 | Attending: Emergency Medicine | Admitting: Emergency Medicine

## 2021-11-18 ENCOUNTER — Other Ambulatory Visit: Payer: Self-pay

## 2021-11-18 ENCOUNTER — Encounter (HOSPITAL_BASED_OUTPATIENT_CLINIC_OR_DEPARTMENT_OTHER): Payer: Self-pay | Admitting: Emergency Medicine

## 2021-11-18 DIAGNOSIS — H02845 Edema of left lower eyelid: Secondary | ICD-10-CM | POA: Diagnosis present

## 2021-11-18 DIAGNOSIS — H00015 Hordeolum externum left lower eyelid: Secondary | ICD-10-CM | POA: Diagnosis not present

## 2021-11-18 MED ORDER — ERYTHROMYCIN 5 MG/GM OP OINT
TOPICAL_OINTMENT | Freq: Once | OPHTHALMIC | Status: AC
  Administered 2021-11-18: 1 via OPHTHALMIC
  Filled 2021-11-18: qty 3.5

## 2021-11-18 NOTE — ED Triage Notes (Signed)
Pt arrives pov, steady gait with c/o possible insect bite to left eye. Swelling noted. Denies blurred vision ?

## 2021-11-18 NOTE — ED Provider Notes (Signed)
?[facility] EMERGENCY DEPARTMENT ?Provider Note ? ? ?CSN: [id number] ?Arrival date & time: 11/18/21  1229 ? ?  ? ?History ? ?Chief Complaint  ?Patient presents with  ? Eye Problem  ? ? ?Jeffery [name] is a 61 y.o. male. ? ?Patient with history of cataract surgery presents the emergency department for evaluation of left lower eyelid swelling starting about 2 days ago.  Area is tender.  No vision changes.  No pain with movement of the eye.  He denies any foreign bodies into the eye.  His surgery was more than 1 year ago.  No contact lenses or other injuries.  No treatments prior to arrival.  Denies fever or other constitutional symptoms. ? ? ?  ? ?Home Medications ?Prior to Admission medications   ?Medication Sig Start Date End Date Taking? Authorizing Provider  ?amLODipine (NORVASC) 5 MG tablet Take 5 mg by mouth daily.      [provider]  ?Coenzyme Q10 (CO Q 10 PO) Take 1 tablet by mouth daily.    [provider]  ?hydrochlorothiazide (HYDRODIURIL) 25 MG tablet Take 25 mg by mouth daily. 07/03/21   [provider]  ?indomethacin (INDOCIN) 25 MG capsule take 1-2 capsules by mouth three times a day if needed for JOINT PAIN 03/18/17   [provider]  ?losartan-hydrochlorothiazide (HYZAAR) 100-25 MG tablet Take 1 tablet by mouth daily. 02/26/17   [provider]  ?methimazole (TAPAZOLE) 10 MG tablet Take 1.5 tablets (15 mg total) by mouth daily. 11/09/21   Shamleffer, Melanie Crazier, MD  ?Omega 3 1000 MG CAPS 1 capsule    [provider]  ?omega-3 acid ethyl esters (LOVAZA) 1 G capsule Take 1 g by mouth daily.    [provider]  ?rosuvastatin (CRESTOR) 10 MG tablet Take 10 mg by mouth at bedtime. 09/06/21   [provider]  ?sildenafil (VIAGRA) 25 MG tablet Take 25-50 mg by mouth daily as needed. ?Patient not taking: Reported on 11/08/2021 08/30/21   [provider]  ?traMADol (ULTRAM) 50 MG tablet Take 1 tablet (50 mg total) by  mouth every 12 (twelve) hours as needed. 07/27/21 07/27/22  Magnant, Charles L, PA-C  ?valsartan-hydrochlorothiazide (DIOVAN-HCT) 320-25 MG per tablet Take 1 tablet by mouth daily.      [provider]  ?   ? ?Allergies    ?Other and Nsaids   ? ?Review of Systems   ?Review of Systems ? ?Physical Exam ?Updated Vital Signs ?BP (!) 145/86   Pulse 93   Temp 98.2 ?F (36.8 ?C) (Oral)   Resp 18   Ht '6\' 2"'$  (1.88 m)   Wt 113.4 kg   SpO2 100%   BMI 32.10 kg/m?  ? ?Physical Exam ?Vitals and nursing note reviewed.  ?Constitutional:   ?   Appearance: He is well-developed.  ?HENT:  ?   Head: Normocephalic and atraumatic.  ?Eyes:  ?   General:     ?   Right eye: No discharge.     ?   Left eye: Hordeolum present.No discharge.  ?   Conjunctiva/sclera:  ?   Right eye: Right conjunctiva is not injected.  ?   Left eye: Left conjunctiva is not injected. No chemosis, exudate or hemorrhage. ? ?   Comments: L lower eyelid hordeolum  ?Pulmonary:  ?   Effort: No respiratory distress.  ?Musculoskeletal:  ?   Cervical back: Normal range of motion and neck supple.  ?Skin: ?   General: Skin is warm  and dry.  ?Neurological:  ?   Mental Status: He is alert.  ? ? ?ED Results / Procedures / Treatments   ?Labs ?(all labs ordered are listed, but only abnormal results are displayed) ?Labs Reviewed - No data to display ? ?EKG ?None ? ?Radiology ?No results found. ? ?Procedures ?Procedures  ? ? ?Medications Ordered in ED ?Medications  ?erythromycin ophthalmic ointment (has no administration in time range)  ? ? ?ED Course/ Medical Decision Making/ A&P ?  ? ?Patient seen and examined. History obtained directly from patient. Work-up including labs, imaging, EKG ordered in triage, if performed, were reviewed.   ? ?Labs/EKG: None ordered ? ?Imaging: None ordered ? ?Medications/Fluids: erythromycin ointment ? ?Most recent vital signs reviewed and are as follows: ?BP (!) 145/86   Pulse 93   Temp 98.2 ?F (36.8 ?C) (Oral)   Resp 18   Ht '6\' 2"'$   (1.88 m)   Wt 113.4 kg   SpO2 100%   BMI 32.10 kg/m?  ? ?Initial impression: Left lower eyelid hordeolum ? ?Home treatment plan: Warm compresses, antibiotic ointment ? ?Return instructions discussed with patient: Worsening swelling or redness around the eye, vision change, severe pain or fever ? ?Follow-up instructions discussed with patient: If not improving with warm compresses and antibiotic ointment over the weekend (today is Saturday), for follow-up with his PCP or ophthalmologist early next week. ? ?Medical Decision Making ?Risk ?Prescription drug management. ? ? ?Patient with eyelid swelling consistent with left lower eyelid hordeolum.  Globe appears normal.  No foreign bodies noted/suspected. No surrounding erythema, swelling, vision changes/loss suspicious for orbital or periorbital cellulitis. No signs of iritis. Doubt glaucoma No symptoms of retinal detachment. No ophthalmologic emergency suspected.  Established with eye care specialist for previous cataracts. ? ? ? ? ? ? ? ? ?Final Clinical Impression(s) / ED Diagnoses ?Final diagnoses:  ?Hordeolum externum of left lower eyelid  ? ? ?Rx / DC Orders ?ED Discharge Orders   ? ? None  ? ?  ? ? ?  ?Carlisle Cater, PA-C ?11/18/21 1418 ? ?  ?Lucrezia Starch, MD ?11/19/21 1441 ? ?

## 2021-11-18 NOTE — Discharge Instructions (Signed)
Please read and follow all provided instructions. ? ?Your diagnoses today include:  ?1. Hordeolum externum of left lower eyelid   ? ? ?Tests performed today include: ?Vital signs. See below for your results today.  ? ?Medications prescribed:  ?Erythromycin  - antibiotic eye ointment ? ?Use this medication as follows: ?Apply 1/4" of the antibiotic ointment to affected eye up to 6 times a day while awake for 7 days ? ?Take any prescribed medications only as directed. ? ?Home care instructions:  ?Follow any educational materials contained in this packet. If you wear contact lenses, do not use them until your eye caregiver approves. Follow-up care is necessary to be sure the infection is healing if not completely resolved in 2-3 days. See your caregiver or eye specialist as suggested for followup.  ? ?If you have an eye infection, wash your hands often as this is very contagious and is easily spread from person to person.  ? ?Follow-up instructions: ?Please follow-up with your primary care doctor OR your opthalmologist in the next 2-3 days for further evaluation of your symptoms if not improved. ? ?Return instructions:  ?Please return to the Emergency Department if you experience worsening symptoms.  ?Please return immediately if you develop severe pain, pus drainage, new change in vision, or fever. ?Please return if you have any other emergent concerns. ? ?Additional Information: ? ?Your vital signs today were: ?BP (!) 145/86   Pulse 93   Temp 98.2 ?F (36.8 ?C) (Oral)   Resp 18   Ht '6\' 2"'$  (1.88 m)   Wt 113.4 kg   SpO2 100%   BMI 32.10 kg/m?  ?If your blood pressure (BP) was elevated above 135/85 this visit, please have this repeated by your doctor within one month. ?--------------- ? ?

## 2022-02-28 ENCOUNTER — Ambulatory Visit (INDEPENDENT_AMBULATORY_CARE_PROVIDER_SITE_OTHER): Payer: 59 | Admitting: Surgical

## 2022-02-28 ENCOUNTER — Ambulatory Visit (INDEPENDENT_AMBULATORY_CARE_PROVIDER_SITE_OTHER): Payer: 59

## 2022-02-28 DIAGNOSIS — M5440 Lumbago with sciatica, unspecified side: Secondary | ICD-10-CM | POA: Diagnosis not present

## 2022-02-28 MED ORDER — METHOCARBAMOL 500 MG PO TABS: 500.0000 mg | ORAL_TABLET | Freq: Three times a day (TID) | ORAL | 0 refills | Status: DC | PRN

## 2022-02-28 MED ORDER — PREDNISONE 5 MG (21) PO TBPK: ORAL_TABLET | ORAL | 0 refills | Status: DC

## 2022-03-04 ENCOUNTER — Encounter: Payer: Self-pay | Admitting: Orthopedic Surgery
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
## 2022-03-09 ENCOUNTER — Other Ambulatory Visit: Payer: Self-pay | Admitting: Surgical

## 2022-03-09 MED ORDER — TRAMADOL HCL 50 MG PO TABS: 50.0000 mg | ORAL_TABLET | Freq: Two times a day (BID) | ORAL | 0 refills | Status: DC | PRN

## 2022-03-12 NOTE — Telephone Encounter (Signed)
Okay for more urgent appointment from my perspective.  May need to refer to [facility] imaging for [facility] after MRI due to severity of his pain

## 2022-03-14 ENCOUNTER — Other Ambulatory Visit: Payer: Self-pay | Admitting: Surgical

## 2022-03-14 ENCOUNTER — Telehealth: Payer: Self-pay | Admitting: Orthopedic Surgery

## 2022-03-14 MED ORDER — METHOCARBAMOL 500 MG PO TABS
500.0000 mg | ORAL_TABLET | Freq: Three times a day (TID) | ORAL | 0 refills | Status: DC | PRN
Start: 2022-03-14 — End: 2022-06-20

## 2022-03-14 NOTE — Telephone Encounter (Signed)
Called pt 1X and left a vm for pt to call and set MRI review appt with Dr Marlou Sa or [place] after 04/11/22

## 2022-03-15 ENCOUNTER — Ambulatory Visit (HOSPITAL_COMMUNITY)
Admission: RE | Admit: 2022-03-15 | Discharge: 2022-03-15 | Disposition: A | Discharge status: Home / Self Care | Payer: 59 | Source: Ambulatory Visit | Attending: Surgical | Admitting: Surgical

## 2022-03-15 ENCOUNTER — Telehealth: Payer: Self-pay | Admitting: Orthopedic Surgery

## 2022-03-15 ENCOUNTER — Encounter (HOSPITAL_COMMUNITY): Payer: Self-pay

## 2022-03-15 DIAGNOSIS — M5440 Lumbago with sciatica, unspecified side: Secondary | ICD-10-CM

## 2022-03-15 NOTE — Telephone Encounter (Signed)
Patient called in stating he tried to get his MRI today and he started freaking out when he was in the machine and [facility] told him he should call us and ask for a Valium to he can be relaxed enough for the imaging to get done, they also asked if he can get STAT placed on the order so he can get it done as soon as possible I advised patient he needs to call frequently to see if any appts have cancelled with them so he may get his MRI done quicker being that is what he was advised to do the first time

## 2022-03-16 ENCOUNTER — Ambulatory Visit (HOSPITAL_COMMUNITY)
Admission: RE | Admit: 2022-03-16 | Discharge: 2022-03-16 | Disposition: A | Discharge status: Home / Self Care | Payer: 59 | Source: Ambulatory Visit | Attending: Surgical | Admitting: Surgical

## 2022-03-16 ENCOUNTER — Other Ambulatory Visit: Payer: Self-pay | Admitting: Surgical

## 2022-03-16 DIAGNOSIS — M5440 Lumbago with sciatica, unspecified side: Secondary | ICD-10-CM | POA: Diagnosis present

## 2022-03-16 MED ORDER — DIAZEPAM 5 MG PO TABS: ORAL_TABLET | ORAL | 0 refills | Status: DC

## 2022-03-16 NOTE — Telephone Encounter (Signed)
Tried calling-went straight to VM. LMVM for patient advising that Lurena Joiner sending in rx now

## 2022-03-16 NOTE — Telephone Encounter (Signed)
Patient called in stating he got a call this morning stating he can get his MRI today at 10 and will need the Valium Rx to be filled please advise

## 2022-03-23 ENCOUNTER — Encounter: Payer: Self-pay | Admitting: Orthopedic Surgery

## 2022-03-23 ENCOUNTER — Ambulatory Visit (INDEPENDENT_AMBULATORY_CARE_PROVIDER_SITE_OTHER): Payer: 59 | Admitting: Surgical

## 2022-03-23 DIAGNOSIS — M5440 Lumbago with sciatica, unspecified side: Secondary | ICD-10-CM | POA: Diagnosis not present

## 2022-03-23 NOTE — Progress Notes (Signed)
Office Visit Note   Patient: Jeffery [name]           Date of Birth: [birth year]           MRN: [id number] Visit Date: 03/23/2022 Requested by: Kathyrn Lass, [place],  [place] [address] PCP: Kathyrn Lass, MD  Subjective: Chief Complaint  Patient presents with   Right Knee - Follow-up    HPI: Jeffery [name] is a 61 y.o. male who presents to the office for MRI review. Patient denies any changes in symptoms.  Continues to complain mainly of left-sided low back pain with radiation to the left buttock.  Denies any leg symptoms past the buttock.  No numbness or tingling.  He has difficulty getting up from a low chair due to his back pain.  Does not take any blood thinners.  Going to chiropractor which is helping to some degree.  Also had good relief from steroid Dosepak and muscle relaxers.  Had previous ESI years ago and 2005 that had given him good relief.  MRI results revealed: MR Lumbar Spine w/o contrast  Result Date: 03/16/2022 CLINICAL DATA:  Left low back pain and left-sided radicular leg pain. EXAM: MRI LUMBAR SPINE WITHOUT CONTRAST TECHNIQUE: Multiplanar, multisequence MR imaging of the lumbar spine was performed. No intravenous contrast was administered. COMPARISON:  Lumbar spine radiographs 02/28/2022 FINDINGS: Segmentation:  Standard. Alignment: Slight left convex curvature of the lumbar spine. Trace retrolisthesis of L5 on S1. Vertebrae: No fracture or suspicious marrow lesion. Moderate degenerative endplate edema at E2-A8. Conus medullaris and cauda equina: Conus extends to the L2 level. Conus and cauda equina appear normal. Paraspinal and other soft tissues: Unremarkable. Disc levels: T11-12: Only imaged sagittally. Mild disc bulging and facet arthrosis result in mild left neural foraminal stenosis without evidence of significant spinal stenosis. T12-L1: Negative. L1-2: Minimal leftward disc bulging and mild facet hypertrophy without stenosis. L2-3: Mild left  eccentric disc bulging and mild facet hypertrophy without stenosis. L3-4: Disc desiccation and mild disc space narrowing. Circumferential disc bulging, a small right paracentral to subarticular disc protrusion, and mild facet hypertrophy result in borderline spinal stenosis, moderate right and mild left lateral recess stenosis, and mild-to-moderate right greater than left neural foraminal stenosis. L4-5: Disc desiccation and mild disc space narrowing. Left eccentric disc bulging, a small left central disc protrusion with annular fissure, and moderate facet hypertrophy result in borderline spinal stenosis, mild-to-moderate left lateral recess stenosis, and mild-to-moderate bilateral neural foraminal stenosis. The disc protrusion may affect the left L5 nerve root. L5-S1: Disc desiccation and severe disc space narrowing. Disc bulging, a small left subarticular disc protrusion, endplate spurring, and mild facet hypertrophy result in moderate left lateral recess stenosis and mild bilateral neural foraminal stenosis without spinal stenosis. Potential left S1 nerve root impingement. IMPRESSION: 1. Multilevel lumbar disc and facet degeneration with multiple small disc protrusions. 2. Moderate left lateral recess stenosis at L5-S1 and mild-to-moderate left lateral recess stenosis at L4-5. 3. Mild-to-moderate multilevel neural foraminal stenosis as above. Electronically Signed   By: Logan Bores M.D.   On: 03/16/2022 11:22                 ROS: All systems reviewed are negative as they relate to the chief complaint within the history of present illness.  Patient denies fevers or chills.  Assessment & Plan: Visit Diagnoses:  1. Acute midline low back pain with sciatica, sciatica laterality unspecified     Plan: Jeffery [name] is  a 61 y.o. male who presents to the office for evaluation of lumbar spine pain and review of MRI.  MRI demonstrates several findings of multiple levels but with his main complaint of axial  lumbar spine pain, suspect that most of his pain is either coming from moderate facet arthritis on the left at L4-L5 or the severe disc space narrowing and endplate changes noted at L5-S1.  Also could be coming from disc bulging with paracentral right-sided protrusion at L3-L4 but think this is less likely given his primarily left-sided pain.  He would like to try lumbar spine ESI's.  He will be set up for Dr. Ernestina Patches and follow-up with Dr. Marlou Sa after this if he has any continued symptoms.  If Dr. Ernestina Patches is too far booked out, recommended he contact our office and we can set him up for injection at [facility] as he is fairly miserable with his current symptoms.  Follow-Up Instructions: No follow-ups on file.   Orders:  Orders Placed This Encounter  Procedures   Ambulatory referral to Physical Medicine Rehab   No orders of the defined types were placed in this encounter.     Procedures: No procedures performed   Clinical Data: No additional findings.  Objective: Vital Signs: There were no vitals taken for this visit.  Physical Exam:  Constitutional: Patient appears well-developed HEENT:  Head: Normocephalic Eyes:EOM are normal Neck: Normal range of motion Cardiovascular: Normal rate Pulmonary/chest: Effort normal Neurologic: Patient is alert Skin: Skin is warm Psychiatric: Patient has normal mood and affect  Ortho Exam: Ortho exam demonstrates axial lumbar spine tenderness around the level of L4-L5 with left-sided paraspinal musculature tenderness.  No pain with hip range of motion.  No tenderness over the greater trochanter./5 motor strength of bilateral hip flexion, quadricep, hamstring, dorsiflexion, plantarflexion.  Negative straight leg raise.  Specialty Comments:  No specialty comments available.  Imaging: No results found.   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   Nuclear sclerotic cataract of both eyes 05/12/2019   Diabetes mellitus ([place]) 09/11/2018   Hypertension 09/11/2018   Hyperthyroidism 09/11/2018   Lipoma of abdominal wall 02/12/2012   Past Medical History:  Diagnosis Date   Anal fissure    Diabetes mellitus without complication ([place])    Fatty liver    Hyperplastic rectal polyp    Hypertension    Lipoma     Family History  Problem Relation Age of Onset   Diabetes Mother    Diabetes Father     Past Surgical History:  Procedure Laterality Date   CATARACT EXTRACTION Left    CERVICAL [place] ARTHROPLASTY  07/23/2005   C-5 and C-6   SHOULDER SURGERY  2008 and 2012   left in 2008, right had rtc tear 2012   Social History   Occupational History   Not on file  Tobacco Use   Smoking status: Never   Smokeless tobacco: Never  Substance and Sexual Activity   Alcohol use: No   Drug use: No   Sexual activity: Not on file

## 2022-03-24 ENCOUNTER — Ambulatory Visit (HOSPITAL_COMMUNITY): Payer: 59

## 2022-03-28 ENCOUNTER — Telehealth: Payer: Self-pay | Admitting: Physical Medicine and Rehabilitation

## 2022-03-28 NOTE — Telephone Encounter (Signed)
Pt called requesting a call to set an appt for injection. Please call pt at [phone].

## 2022-04-02 DIAGNOSIS — M9903 Segmental and somatic dysfunction of lumbar region: Secondary | ICD-10-CM | POA: Diagnosis not present

## 2022-04-02 DIAGNOSIS — M9905 Segmental and somatic dysfunction of pelvic region: Secondary | ICD-10-CM | POA: Diagnosis not present

## 2022-04-02 DIAGNOSIS — M545 Low back pain, unspecified: Secondary | ICD-10-CM | POA: Diagnosis not present

## 2022-04-02 DIAGNOSIS — M9904 Segmental and somatic dysfunction of sacral region: Secondary | ICD-10-CM | POA: Diagnosis not present

## 2022-04-06 ENCOUNTER — Encounter: Payer: Self-pay | Admitting: Physical Medicine and Rehabilitation

## 2022-04-06 ENCOUNTER — Ambulatory Visit: Payer: 59 | Admitting: Physical Medicine and Rehabilitation

## 2022-04-06 VITALS — BP 166/92 | HR 80

## 2022-04-06 DIAGNOSIS — M47816 Spondylosis without myelopathy or radiculopathy, lumbar region: Secondary | ICD-10-CM | POA: Diagnosis not present

## 2022-04-06 DIAGNOSIS — M5416 Radiculopathy, lumbar region: Secondary | ICD-10-CM

## 2022-04-06 DIAGNOSIS — M5116 Intervertebral disc disorders with radiculopathy, lumbar region: Secondary | ICD-10-CM

## 2022-04-06 NOTE — Progress Notes (Unsigned)
Intermittent pain since June. Left sided lbp. No leg pain. No numbness or tingling. No leg weakness. Was taking tramadol and methocarbomol but stopped. Just taking tylenol.   Numeric Pain Rating Scale and Functional Assessment Average Pain 5   In the last MONTH (on 0-10 scale) has pain interfered with the following?  1. General activity like being  able to carry out your everyday physical activities such as walking, climbing stairs, carrying groceries, or moving a chair?  Rating(10)

## 2022-04-06 NOTE — Progress Notes (Unsigned)
Jeffery [name] - 61 y.o. male MRN [id number]  Date of birth: 1961/01/14  Office Visit Note: Visit Date: 04/06/2022 PCP: Kathyrn Lass, MD Referred by: Kathyrn Lass, MD  Subjective: Chief Complaint  Patient presents with   Lower Back - Pain   HPI: Jeffery [name] is a 61 y.o. male who comes in today per the request of Dr. Alphonzo Severance for evaluation of chronic, worsening and severe left sided lower back pain radiating to buttock.  Pain ongoing for several years and is exacerbated by movement and activity.  He describes his pain as a sore and aching sensation, currently rates a 7 out of 10.  Some relief of pain with home exercise regimen, rest and use of medications. Some relief with Prednisone and Robaxin.  Patient has recently completed regimen of chiropractic treatments with Dr. Marrion Coy and reports some relief of pain with these treatments. Recent lumbar MRI imaging exhibits multilevel lumbar disc and facet degeneration with multiple small disc protrusions, moderate left lateral recess stenosis at L5-S1 and mild-to-moderate left lateral recess stenosis at L4-5.  No high-grade spinal canal stenosis noted. Patient was previously treated by Dr. Suella Broad and Dr. Marlaine Hind, history of lumbar epidural steroid injections in 2005 with significant relief of pain.  Patient denies focal weakness, numbness and tingling.  Patient denies recent trauma or falls.       Review of Systems  Musculoskeletal:  Positive for back pain.  Neurological:  Negative for tingling, sensory change, focal weakness and weakness.  All other systems reviewed and are negative.  Otherwise per HPI.  Assessment & Plan: Visit Diagnoses:    ICD-10-CM   1. Lumbar radiculopathy  M54.16 Ambulatory referral to Physical Medicine Rehab    2. Intervertebral disc disorders with radiculopathy, lumbar region  M51.16 Ambulatory referral to Physical Medicine Rehab    3. Facet hypertrophy of lumbar region  M47.816 Ambulatory  referral to Physical Medicine Rehab       Plan: Findings:  Chronic, worsening and severe left sided lower back pain radiating to buttock.  Patient continues to have severe pain despite good conservative therapy such as home exercise regimen, rest and use of medications.  Patient's clinical presentation and exam are consistent with lumbar radiculopathy, fits more of an L5/S1 nerve pattern.  Neck step is to perform diagnostic and hopefully therapeutic left L5-S1 interlaminar epidural steroid injection under fluoroscopic guidance.  Patient is not currently taking anticoagulant medication.  If patient gets significant and sustained relief with injection we can repeat this procedure infrequently as needed.  If his pain persists we would consider a transforaminal approach.  We also discussed possibility of regrouping with physical therapy/chiropractor. No red flag symptoms noted upon exam today.     Meds & Orders: No orders of the defined types were placed in this encounter.   Orders Placed This Encounter  Procedures   Ambulatory referral to Physical Medicine Rehab    Follow-up: Return for Left L5-S1 interlaminar epidural steroid injection.   Procedures: No procedures performed      Clinical History: EXAM: MRI LUMBAR SPINE WITHOUT CONTRAST   TECHNIQUE: Multiplanar, multisequence MR imaging of the lumbar spine was performed. No intravenous contrast was administered.   COMPARISON:  Lumbar spine radiographs 02/28/2022   FINDINGS: Segmentation:  Standard.   Alignment: Slight left convex curvature of the lumbar spine. Trace retrolisthesis of L5 on S1.   Vertebrae: No fracture or suspicious marrow lesion. Moderate degenerative endplate edema at Y0-D9.   Conus medullaris  and cauda equina: Conus extends to the L2 level. Conus and cauda equina appear normal.   Paraspinal and other soft tissues: Unremarkable.   Disc levels:   T11-12: Only imaged sagittally. Mild disc bulging and  facet arthrosis result in mild left neural foraminal stenosis without evidence of significant spinal stenosis.   T12-L1: Negative.   L1-2: Minimal leftward disc bulging and mild facet hypertrophy without stenosis.   L2-3: Mild left eccentric disc bulging and mild facet hypertrophy without stenosis.   L3-4: Disc desiccation and mild disc space narrowing. Circumferential disc bulging, a small right paracentral to subarticular disc protrusion, and mild facet hypertrophy result in borderline spinal stenosis, moderate right and mild left lateral recess stenosis, and mild-to-moderate right greater than left neural foraminal stenosis.   L4-5: Disc desiccation and mild disc space narrowing. Left eccentric disc bulging, a small left central disc protrusion with annular fissure, and moderate facet hypertrophy result in borderline spinal stenosis, mild-to-moderate left lateral recess stenosis, and mild-to-moderate bilateral neural foraminal stenosis. The disc protrusion may affect the left L5 nerve root.   L5-S1: Disc desiccation and severe disc space narrowing. Disc bulging, a small left subarticular disc protrusion, endplate spurring, and mild facet hypertrophy result in moderate left lateral recess stenosis and mild bilateral neural foraminal stenosis without spinal stenosis. Potential left S1 nerve root impingement.   IMPRESSION: 1. Multilevel lumbar disc and facet degeneration with multiple small disc protrusions. 2. Moderate left lateral recess stenosis at L5-S1 and mild-to-moderate left lateral recess stenosis at L4-5. 3. Mild-to-moderate multilevel neural foraminal stenosis as above.     Electronically Signed   By: Logan Bores M.D.   On: 03/16/2022 11:22   He reports that he has never smoked. He has never used smokeless tobacco. No results for input(s): "HGBA1C", "LABURIC" in the last 8760 hours.  Objective:  VS:  HT:    WT:   BMI:     BP:(!) 166/92  HR:80bpm  TEMP: (  )  RESP:  Physical Exam Vitals and nursing note reviewed.  HENT:     Head: Normocephalic and atraumatic.     Right Ear: External ear normal.     Left Ear: External ear normal.     Nose: Nose normal.     Mouth/Throat:     Mouth: Mucous membranes are moist.  Eyes:     Extraocular Movements: Extraocular movements intact.  Cardiovascular:     Rate and Rhythm: Normal rate.     Pulses: Normal pulses.  Pulmonary:     Effort: Pulmonary effort is normal.  Abdominal:     General: Abdomen is flat. There is no distension.  Musculoskeletal:        General: Tenderness present.     Cervical back: Normal range of motion.     Comments: Pt rises from seated position to standing without difficulty. Good lumbar range of motion. Strong distal strength without clonus, no pain upon palpation of greater trochanters. Sensation intact bilaterally. Dysesthesias noted to left L5/S1 dermatomes. Walks independently, gait steady.   Skin:    General: Skin is warm and dry.     Capillary Refill: Capillary refill takes less than 2 seconds.  Neurological:     General: No focal deficit present.     Mental Status: He is alert and oriented to person, place, and time.  Psychiatric:        Mood and Affect: Mood normal.        Behavior: Behavior normal.     Ortho Exam  Imaging: No results found.  Past Medical/Family/Surgical/Social History: Medications & Allergies reviewed per EMR, new medications updated. 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   Nuclear sclerotic cataract of both eyes 05/12/2019   Diabetes mellitus ([place]) 09/11/2018   Hypertension 09/11/2018   Hyperthyroidism 09/11/2018   Lipoma of abdominal wall 02/12/2012   Past Medical History:  Diagnosis Date   Anal fissure    Diabetes mellitus without complication ([place])    Fatty liver     Hyperplastic rectal polyp    Hypertension    Lipoma    Family History  Problem Relation Age of Onset   Diabetes Mother    Diabetes Father    Past Surgical History:  Procedure Laterality Date   CATARACT EXTRACTION Left    CERVICAL [place] ARTHROPLASTY  07/23/2005   C-5 and C-6   SHOULDER SURGERY  2008 and 2012   left in 2008, right had rtc tear 2012   Social History   Occupational History   Not on file  Tobacco Use   Smoking status: Never   Smokeless tobacco: Never  Substance and Sexual Activity   Alcohol use: No   Drug use: No   Sexual activity: Not on file

## 2022-04-12 ENCOUNTER — Ambulatory Visit: Payer: Self-pay

## 2022-04-12 ENCOUNTER — Ambulatory Visit: Payer: 59 | Admitting: Physical Medicine and Rehabilitation

## 2022-04-12 VITALS — BP 156/78 | HR 76

## 2022-04-12 DIAGNOSIS — M5416 Radiculopathy, lumbar region: Secondary | ICD-10-CM

## 2022-04-12 MED ORDER — METHYLPREDNISOLONE ACETATE 80 MG/ML IJ SUSP
80.0000 mg | Freq: Once | INTRAMUSCULAR | Status: AC
  Administered 2022-04-12: 80 mg

## 2022-04-12 NOTE — Patient Instructions (Signed)

## 2022-04-12 NOTE — Progress Notes (Signed)
Numeric Pain Rating Scale and Functional Assessment Average Pain 7   In the last MONTH (on 0-10 scale) has pain interfered with the following?  1. General activity like being  able to carry out your everyday physical activities such as walking, climbing stairs, carrying groceries, or moving a chair?  Rating(9)   +Driver, -BT, -Dye Allergies.   Has had issues with hiccups and hives on his torso with taking oral steroids in the past. He took Benedryl to fix that.

## 2022-04-19 ENCOUNTER — Other Ambulatory Visit: Payer: Self-pay | Admitting: Physical Medicine and Rehabilitation

## 2022-04-19 ENCOUNTER — Telehealth: Payer: Self-pay | Admitting: Physical Medicine and Rehabilitation

## 2022-04-19 MED ORDER — ACETAMINOPHEN-CODEINE 300-30 MG PO TABS: 1.0000 | ORAL_TABLET | Freq: Three times a day (TID) | ORAL | 0 refills | Status: DC | PRN

## 2022-04-19 NOTE — Progress Notes (Signed)
Spoke with patient via telephone. Previous left L5-S1 interlaminar epidural steroid injection on 04/12/2022, states good pain relief for approximately 1 week, now states pain is returning. I informed him to give injection a few more days to see if pain does subside. I did call in short course of Tylenol #3. He is to call us at the beginning of next week.

## 2022-04-19 NOTE — Progress Notes (Signed)
Jeffery [name] - 61 y.o. male MRN [id number]  Date of birth: 30-Oct-1960  Office Visit Note: Visit Date: 04/12/2022 PCP: Kathyrn Lass, MD Referred by: Lorine Bears, NP  Subjective: Chief Complaint  Patient presents with   Lower Back - Pain    Left L5-S1 IL   HPI:  Jeffery [name] is a 61 y.o. male who comes in today at the request of Barnet Pall, FNP for planned Left L5-S1 Lumbar Interlaminar epidural steroid injection with fluoroscopic guidance.  The patient has failed conservative care including home exercise, medications, time and activity modification.  This injection will be diagnostic and hopefully therapeutic.  Please see requesting physician notes for further details and justification.   ROS Otherwise per HPI.  Assessment & Plan: Visit Diagnoses:    ICD-10-CM   1. Lumbar radiculopathy  M54.16 XR C-ARM NO REPORT    Epidural Steroid injection    methylPREDNISolone acetate (DEPO-MEDROL) injection 80 mg      Plan: No additional findings.   Meds & Orders:  Meds ordered this encounter  Medications   methylPREDNISolone acetate (DEPO-MEDROL) injection 80 mg    Orders Placed This Encounter  Procedures   XR C-ARM NO REPORT   Epidural Steroid injection    Follow-up: Return for visit to requesting provider as needed.   Procedures: No procedures performed  Lumbar Epidural Steroid Injection - Interlaminar Approach with Fluoroscopic Guidance  Patient: Jeffery [name]      Date of Birth: 08-Jan-1961 MRN: [id number] PCP: Kathyrn Lass, MD      Visit Date: 04/12/2022   Universal Protocol:     Consent Given By: the patient  Position: PRONE  Additional Comments: Vital signs were monitored before and after the procedure. Patient was prepped and draped in the usual sterile fashion. The correct patient, procedure, and site was verified.   Injection Procedure Details:   Procedure diagnoses: Lumbar radiculopathy [M54.16]   Meds Administered:  Meds ordered  this encounter  Medications   methylPREDNISolone acetate (DEPO-MEDROL) injection 80 mg     Laterality: Left  Location/Site:  L5-S1  Needle: 4.5 in., 20 ga. Tuohy  Needle Placement: Paramedian epidural  Findings:   -Comments: Excellent flow of contrast into the epidural space.  Procedure Details: Using a paramedian approach from the side mentioned above, the region overlying the inferior lamina was localized under fluoroscopic visualization and the soft tissues overlying this structure were infiltrated with 4 ml. of 1% Lidocaine without Epinephrine. The Tuohy needle was inserted into the epidural space using a paramedian approach.   The epidural space was localized using loss of resistance along with counter oblique bi-planar fluoroscopic views.  After negative aspirate for air, blood, and CSF, a 2 ml. volume of Isovue-250 was injected into the epidural space and the flow of contrast was observed. Radiographs were obtained for documentation purposes.    The injectate was administered into the level noted above.   Additional Comments:  The patient tolerated the procedure well Dressing: 2 x 2 sterile gauze and Band-Aid    Post-procedure details: Patient was observed during the procedure. Post-procedure instructions were reviewed.  Patient left the clinic in stable condition.   Clinical History: EXAM: MRI LUMBAR SPINE WITHOUT CONTRAST   TECHNIQUE: Multiplanar, multisequence MR imaging of the lumbar spine was performed. No intravenous contrast was administered.   COMPARISON:  Lumbar spine radiographs 02/28/2022   FINDINGS: Segmentation:  Standard.   Alignment: Slight left convex curvature of the lumbar spine. Trace retrolisthesis of L5 on  S1.   Vertebrae: No fracture or suspicious marrow lesion. Moderate degenerative endplate edema at R4-E3.   Conus medullaris and cauda equina: Conus extends to the L2 level. Conus and cauda equina appear normal.   Paraspinal and  other soft tissues: Unremarkable.   Disc levels:   T11-12: Only imaged sagittally. Mild disc bulging and facet arthrosis result in mild left neural foraminal stenosis without evidence of significant spinal stenosis.   T12-L1: Negative.   L1-2: Minimal leftward disc bulging and mild facet hypertrophy without stenosis.   L2-3: Mild left eccentric disc bulging and mild facet hypertrophy without stenosis.   L3-4: Disc desiccation and mild disc space narrowing. Circumferential disc bulging, a small right paracentral to subarticular disc protrusion, and mild facet hypertrophy result in borderline spinal stenosis, moderate right and mild left lateral recess stenosis, and mild-to-moderate right greater than left neural foraminal stenosis.   L4-5: Disc desiccation and mild disc space narrowing. Left eccentric disc bulging, a small left central disc protrusion with annular fissure, and moderate facet hypertrophy result in borderline spinal stenosis, mild-to-moderate left lateral recess stenosis, and mild-to-moderate bilateral neural foraminal stenosis. The disc protrusion may affect the left L5 nerve root.   L5-S1: Disc desiccation and severe disc space narrowing. Disc bulging, a small left subarticular disc protrusion, endplate spurring, and mild facet hypertrophy result in moderate left lateral recess stenosis and mild bilateral neural foraminal stenosis without spinal stenosis. Potential left S1 nerve root impingement.   IMPRESSION: 1. Multilevel lumbar disc and facet degeneration with multiple small disc protrusions. 2. Moderate left lateral recess stenosis at L5-S1 and mild-to-moderate left lateral recess stenosis at L4-5. 3. Mild-to-moderate multilevel neural foraminal stenosis as above.     Electronically Signed   By: Logan Bores M.D.   On: 03/16/2022 11:22     Objective:  VS:  HT:    WT:   BMI:     BP:(!) 156/78  HR:76bpm  TEMP: ( )  RESP:98 % Physical  Exam Vitals and nursing note reviewed.  Constitutional:      General: He is not in acute distress.    Appearance: Normal appearance. He is not ill-appearing.  HENT:     Head: Normocephalic and atraumatic.     Right Ear: External ear normal.     Left Ear: External ear normal.     Nose: No congestion.  Eyes:     Extraocular Movements: Extraocular movements intact.  Cardiovascular:     Rate and Rhythm: Normal rate.     Pulses: Normal pulses.  Pulmonary:     Effort: Pulmonary effort is normal. No respiratory distress.  Abdominal:     General: There is no distension.     Palpations: Abdomen is soft.  Musculoskeletal:        General: No tenderness or signs of injury.     Cervical back: Neck supple.     Right lower leg: No edema.     Left lower leg: No edema.     Comments: Patient has good distal strength without clonus.  Skin:    Findings: No erythema or rash.  Neurological:     General: No focal deficit present.     Mental Status: He is alert and oriented to person, place, and time.     Sensory: No sensory deficit.     Motor: No weakness or abnormal muscle tone.     Coordination: Coordination normal.  Psychiatric:        Mood and Affect: Mood normal.  Behavior: Behavior normal.      Imaging: No results found.

## 2022-04-19 NOTE — Telephone Encounter (Signed)
IC advised meds sent

## 2022-04-19 NOTE — Procedures (Signed)
Lumbar Epidural Steroid Injection - Interlaminar Approach with Fluoroscopic Guidance  Patient: Jeffery [name]      Date of Birth: 10/01/1960 MRN: [id number] PCP: Kathyrn Lass, MD      Visit Date: 04/12/2022   Universal Protocol:     Consent Given By: the patient  Position: PRONE  Additional Comments: Vital signs were monitored before and after the procedure. Patient was prepped and draped in the usual sterile fashion. The correct patient, procedure, and site was verified.   Injection Procedure Details:   Procedure diagnoses: Lumbar radiculopathy [M54.16]   Meds Administered:  Meds ordered this encounter  Medications   methylPREDNISolone acetate (DEPO-MEDROL) injection 80 mg     Laterality: Left  Location/Site:  L5-S1  Needle: 4.5 in., 20 ga. Tuohy  Needle Placement: Paramedian epidural  Findings:   -Comments: Excellent flow of contrast into the epidural space.  Procedure Details: Using a paramedian approach from the side mentioned above, the region overlying the inferior lamina was localized under fluoroscopic visualization and the soft tissues overlying this structure were infiltrated with 4 ml. of 1% Lidocaine without Epinephrine. The Tuohy needle was inserted into the epidural space using a paramedian approach.   The epidural space was localized using loss of resistance along with counter oblique bi-planar fluoroscopic views.  After negative aspirate for air, blood, and CSF, a 2 ml. volume of Isovue-250 was injected into the epidural space and the flow of contrast was observed. Radiographs were obtained for documentation purposes.    The injectate was administered into the level noted above.   Additional Comments:  The patient tolerated the procedure well Dressing: 2 x 2 sterile gauze and Band-Aid    Post-procedure details: Patient was observed during the procedure. Post-procedure instructions were reviewed.  Patient left the clinic in stable condition.

## 2022-04-19 NOTE — Telephone Encounter (Signed)
Patient called. He says he is still in pain. His call back  number is [phone]

## 2022-04-23 ENCOUNTER — Other Ambulatory Visit: Payer: Self-pay | Admitting: Physical Medicine and Rehabilitation

## 2022-04-23 ENCOUNTER — Telehealth: Payer: Self-pay | Admitting: Physical Medicine and Rehabilitation

## 2022-04-23 DIAGNOSIS — M5416 Radiculopathy, lumbar region: Secondary | ICD-10-CM

## 2022-04-23 NOTE — Telephone Encounter (Signed)
Patient called. Returning a call to schedule with Dr. Newton.  ?

## 2022-04-23 NOTE — Telephone Encounter (Signed)
Pt called requesting a call back from [facility] or Dr Ernestina Patches. Pt states he is having medical issues. Please call pt at [phone].

## 2022-04-23 NOTE — Progress Notes (Signed)
Spoke with patient this morning, he continues to have pain, greater than 50% relief of pain with previous epidural steroid injection on 04/12/2022, states relief of pain lasted over a week. I will place order to repeat injection, I instructed him to continue with Tylenol #3 as needed.

## 2022-04-27 ENCOUNTER — Telehealth: Payer: Self-pay | Admitting: Physical Medicine and Rehabilitation

## 2022-04-27 NOTE — Telephone Encounter (Signed)
Patient called. Returning a call to [place]. His call back number is [phone]

## 2022-04-27 NOTE — Telephone Encounter (Signed)
Per telephone call with patient on 04/23/2022. I placed order for planned left L5-S1 interlaminar epidural steroid , to be performed in the upcoming weeks.

## 2022-04-30 ENCOUNTER — Other Ambulatory Visit: Payer: Self-pay | Admitting: Physical Medicine and Rehabilitation

## 2022-04-30 ENCOUNTER — Telehealth: Payer: Self-pay | Admitting: Physical Medicine and Rehabilitation

## 2022-04-30 MED ORDER — ACETAMINOPHEN-CODEINE 300-30 MG PO TABS: 1.0000 | ORAL_TABLET | Freq: Three times a day (TID) | ORAL | 0 refills | Status: DC | PRN

## 2022-04-30 NOTE — Telephone Encounter (Signed)
Patient called. He would like an appointment with Dr. Ernestina Patches. His call back number is [phone]

## 2022-05-01 ENCOUNTER — Telehealth: Payer: Self-pay | Admitting: Physical Medicine and Rehabilitation

## 2022-05-01 NOTE — Telephone Encounter (Signed)
IC patient to schedule and he stated he had been scheduled already for OV

## 2022-05-01 NOTE — Telephone Encounter (Signed)
Pt returned call stating PA Jinny Blossom called him. It looks like Lauren F left vm. Please call pt back at [phone].

## 2022-05-01 NOTE — Telephone Encounter (Signed)
Per Jinny Blossom, patient to come in for OV this has been scheduled

## 2022-05-02 ENCOUNTER — Encounter: Payer: Self-pay | Admitting: Physical Medicine and Rehabilitation

## 2022-05-02 ENCOUNTER — Ambulatory Visit: Payer: 59 | Admitting: Physical Medicine and Rehabilitation

## 2022-05-02 VITALS — BP 147/78 | HR 77

## 2022-05-02 DIAGNOSIS — M48061 Spinal stenosis, lumbar region without neurogenic claudication: Secondary | ICD-10-CM

## 2022-05-02 DIAGNOSIS — M5416 Radiculopathy, lumbar region: Secondary | ICD-10-CM

## 2022-05-02 DIAGNOSIS — M47816 Spondylosis without myelopathy or radiculopathy, lumbar region: Secondary | ICD-10-CM | POA: Diagnosis not present

## 2022-05-02 DIAGNOSIS — M5116 Intervertebral disc disorders with radiculopathy, lumbar region: Secondary | ICD-10-CM

## 2022-05-02 NOTE — Progress Notes (Signed)
Numeric Pain Rating Scale and Functional Assessment Average Pain 10   In the last MONTH (on 0-10 scale) has pain interfered with the following?  1. General activity like being  able to carry out your everyday physical activities such as walking, climbing stairs, carrying groceries, or moving a chair?  Rating(10)   +Driver, -BT, -Dye Allergies.   Bilateral lower back pain. Any sudden movements makes pain worse. Tylenol, Shower and Aspricream to ease pain

## 2022-05-02 NOTE — Progress Notes (Signed)
Jeffery [name] - 61 y.o. male MRN [id number]  Date of birth: [birth year]  Office Visit Note: Visit Date: 05/02/2022 PCP: Kathyrn Lass, MD Referred by: Kathyrn Lass, MD  Subjective: Chief Complaint  Patient presents with   Lower Back - Pain   HPI: Jeffery [name] is a 61 y.o. male who comes in today for evaluation of chronic, worsening and severe bilateral lower back pain radiating to buttocks and legs, left greater than right. Pain ongoing for several years and is exacerbated by movement and activity. He describes his pain as sore and aching, currently rates as 10 out of 10.  Some relief of pain with home exercise regimen, rest and use of medications. Some relief with Tylenol #3. Patient has recently completed regimen of chiropractic treatments with Dr. Marrion Coy and reports some relief of pain with these treatments. Recent lumbar MRI imaging exhibits multilevel lumbar disc and facet degeneration with multiple small disc protrusions, moderate left lateral recess stenosis at L5-S1 and mild-to-moderate left lateral recess stenosis at L4-5.  No high-grade spinal canal stenosis noted. Patient was previously treated by Dr. Suella Broad and Dr. Marlaine Hind, history of lumbar epidural steroid injections in 2005 with significant relief of pain. Patient recently underwent left L5-S1 interlaminar epidural steroid injection in our office, he reports greater than 50% relief of pain for approximately 2 weeks and increase in functional ability with this procedure. Patient states his pain has gradually started to return. We attempted to get repeat injection approved, however his insurance denied. Patient denies focal weakness, numbness and tingling. Patient denies recent trauma or falls.    Review of Systems  Musculoskeletal:  Positive for back pain.  Neurological:  Negative for tingling, sensory change, focal weakness and weakness.  All other systems reviewed and are negative.  Otherwise per  HPI.  Assessment & Plan: Visit Diagnoses:    ICD-10-CM   1. Lumbar radiculopathy  M54.16     2. Intervertebral disc disorders with radiculopathy, lumbar region  M51.16     3. 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 Facet hypertrophy of lumbar region  M47.816        Plan: Findings:  chronic, worsening and severe bilateral lower back pain radiating to buttocks and legs, left greater than right. Patient continues to have severe pain despite good conservative therapies such as chiropractic treatments, home exercise regimen, rest and use of medications. Patient's clinical presentation and exam are consistent with lumbar radiculopathy, fits more of an L5/S1 nerve pattern. Next step is to repeat left L5-S1 interlaminar epidural steroid injection under fluoroscopic guidance, patient is not currently taking anticoagulant medications. If patient gets significant and sustained relief with injection we can repeat this procedure infrequently as needed.  If his pain persists we would consider a transforaminal approach.  We also discussed possibility of regrouping with physical therapy/chiropractor. No red flag symptoms noted upon exam today.   Prior injection did offer more than 50% relief as noted above and lasted approximately 2 weeks and did give him more functional ability for activities of daily living and was also beneficial in that it did reduce his medication requirement.  He has undergone chiropractic treatments and continues with directed home exercise program.  Current medication management is not beneficial in increasing their functional status.  Please note that procedures are done as part of a comprehensive orthopedic and pain management program with access to in-house orthopedics, spine surgery and physical therapy as well as  access to [facility] biopsychosocial counseling if needed.        Meds & Orders: No orders of the defined types were placed in this  encounter.  No orders of the defined types were placed in this encounter.   Follow-up: Return for Left L5-S1 interlaminar epidural steroid injection.   Procedures: No procedures performed      Clinical History: EXAM: MRI LUMBAR SPINE WITHOUT CONTRAST   TECHNIQUE: Multiplanar, multisequence MR imaging of the lumbar spine was performed. No intravenous contrast was administered.   COMPARISON:  Lumbar spine radiographs 02/28/2022   FINDINGS: Segmentation:  Standard.   Alignment: Slight left convex curvature of the lumbar spine. Trace retrolisthesis of L5 on S1.   Vertebrae: No fracture or suspicious marrow lesion. Moderate degenerative endplate edema at X8-P3.   Conus medullaris and cauda equina: Conus extends to the L2 level. Conus and cauda equina appear normal.   Paraspinal and other soft tissues: Unremarkable.   Disc levels:   T11-12: Only imaged sagittally. Mild disc bulging and facet arthrosis result in mild left neural foraminal stenosis without evidence of significant spinal stenosis.   T12-L1: Negative.   L1-2: Minimal leftward disc bulging and mild facet hypertrophy without stenosis.   L2-3: Mild left eccentric disc bulging and mild facet hypertrophy without stenosis.   L3-4: Disc desiccation and mild disc space narrowing. Circumferential disc bulging, a small right paracentral to subarticular disc protrusion, and mild facet hypertrophy result in borderline spinal stenosis, moderate right and mild left lateral recess stenosis, and mild-to-moderate right greater than left neural foraminal stenosis.   L4-5: Disc desiccation and mild disc space narrowing. Left eccentric disc bulging, a small left central disc protrusion with annular fissure, and moderate facet hypertrophy result in borderline spinal stenosis, mild-to-moderate left lateral recess stenosis, and mild-to-moderate bilateral neural foraminal stenosis. The disc protrusion may affect the left L5  nerve root.   L5-S1: Disc desiccation and severe disc space narrowing. Disc bulging, a small left subarticular disc protrusion, endplate spurring, and mild facet hypertrophy result in moderate left lateral recess stenosis and mild bilateral neural foraminal stenosis without spinal stenosis. Potential left S1 nerve root impingement.   IMPRESSION: 1. Multilevel lumbar disc and facet degeneration with multiple small disc protrusions. 2. Moderate left lateral recess stenosis at L5-S1 and mild-to-moderate left lateral recess stenosis at L4-5. 3. Mild-to-moderate multilevel neural foraminal stenosis as above.     Electronically Signed   By: Logan Bores M.D.   On: 03/16/2022 11:22   He reports that he has never smoked. He has never used smokeless tobacco. No results for input(s): "HGBA1C", "LABURIC" in the last 8760 hours.  Objective:  VS:  HT:    WT:   BMI:     BP:(!) 147/78  HR:77bpm  TEMP: ( )  RESP:  Physical Exam Vitals and nursing note reviewed.  HENT:     Head: Normocephalic and atraumatic.     Right Ear: External ear normal.     Left Ear: External ear normal.     Nose: Nose normal.     Mouth/Throat:     Mouth: Mucous membranes are moist.  Eyes:     Extraocular Movements: Extraocular movements intact.  Cardiovascular:     Rate and Rhythm: Normal rate.     Pulses: Normal pulses.  Pulmonary:     Effort: Pulmonary effort is normal.  Abdominal:     General: Abdomen is flat. There is distension.  Musculoskeletal:  General: Tenderness present.     Cervical back: Normal range of motion.     Comments: Pt rises from seated position to standing without difficulty. Good lumbar range of motion. Strong distal strength without clonus, no pain upon palpation of greater trochanters. Sensation intact bilaterally. Dysesthesias noted to left L5/S1 dermatomes. Walks independently, gait steady.    Skin:    General: Skin is warm and dry.     Capillary Refill: Capillary refill  takes less than 2 seconds.  Neurological:     General: No focal deficit present.     Mental Status: He is alert and oriented to person, place, and time.  Psychiatric:        Mood and Affect: Mood normal.        Behavior: Behavior normal.     Ortho Exam  Imaging: No results found.  Past Medical/Family/Surgical/Social History: Medications & Allergies reviewed per EMR, new medications updated. 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   Nuclear sclerotic cataract of both eyes 05/12/2019   Diabetes mellitus ([place]) 09/11/2018   Hypertension 09/11/2018   Hyperthyroidism 09/11/2018   Lipoma of abdominal wall 02/12/2012   Past Medical History:  Diagnosis Date   Anal fissure    Diabetes mellitus without complication ([place])    Fatty liver    Hyperplastic rectal polyp    Hypertension    Lipoma    Family History  Problem Relation Age of Onset   Diabetes Mother    Diabetes Father    Past Surgical History:  Procedure Laterality Date   CATARACT EXTRACTION Left    CERVICAL [place] ARTHROPLASTY  07/23/2005   C-5 and C-6   SHOULDER SURGERY  2008 and 2012   left in 2008, right had rtc tear 2012   Social History   Occupational History   Not on file  Tobacco Use   Smoking status: Never   Smokeless tobacco: Never  Substance and Sexual Activity   Alcohol use: No   Drug use: No   Sexual activity: Not on file

## 2022-05-04 ENCOUNTER — Telehealth: Payer: Self-pay | Admitting: Physical Medicine and Rehabilitation

## 2022-05-04 NOTE — Telephone Encounter (Signed)
Patient got a call from [employer] the [employer] and they told him to reach to his dr office. Please advise the best number to reach patient  [phone]

## 2022-05-07 NOTE — Telephone Encounter (Signed)
This has been authorized by [employer] already, so I am not sure why they told the patient that -- I left the patient a voice mail this morning advising him of this. He is ready to schedule.

## 2022-05-07 NOTE — Telephone Encounter (Signed)
I will call patient as soon as I can to schedule.

## 2022-05-15 ENCOUNTER — Ambulatory Visit: Payer: 59 | Admitting: Physical Medicine and Rehabilitation

## 2022-05-15 ENCOUNTER — Ambulatory Visit: Payer: Self-pay

## 2022-05-15 VITALS — BP 156/78 | HR 79

## 2022-05-15 DIAGNOSIS — M5416 Radiculopathy, lumbar region: Secondary | ICD-10-CM

## 2022-05-15 MED ORDER — METHYLPREDNISOLONE ACETATE 80 MG/ML IJ SUSP
80.0000 mg | Freq: Once | INTRAMUSCULAR | Status: AC
  Administered 2022-05-15: 80 mg

## 2022-05-15 NOTE — Progress Notes (Signed)
Left L5-S1 IL +driver Denies radiculopathy No blood thinners

## 2022-05-15 NOTE — Patient Instructions (Signed)

## 2022-05-22 NOTE — Progress Notes (Signed)
Jeffery [name] - 61 y.o. male MRN [id number]  Date of birth: 05/28/1961  Office Visit Note: Visit Date: 05/15/2022 PCP: Kathyrn Lass, MD Referred by: Lorine Bears, NP  Subjective: Chief Complaint  Patient presents with   Lower Back - Pain   HPI:  Jeffery [name] is a 61 y.o. male who comes in today at the request of Barnet Pall, FNP for planned Left L5-S1 Lumbar Interlaminar epidural steroid injection with fluoroscopic guidance.  The patient has failed conservative care including home exercise, medications, time and activity modification.  This injection will be diagnostic and hopefully therapeutic.  Please see requesting physician notes for further details and justification.   ROS Otherwise per HPI.  Assessment & Plan: Visit Diagnoses:    ICD-10-CM   1. Lumbar radiculopathy  M54.16 XR C-ARM NO REPORT    Epidural Steroid injection    methylPREDNISolone acetate (DEPO-MEDROL) injection 80 mg      Plan: No additional findings.   Meds & Orders:  Meds ordered this encounter  Medications   methylPREDNISolone acetate (DEPO-MEDROL) injection 80 mg    Orders Placed This Encounter  Procedures   XR C-ARM NO REPORT   Epidural Steroid injection    Follow-up: Return for visit to requesting provider as needed.   Procedures: No procedures performed  Lumbar Epidural Steroid Injection - Interlaminar Approach with Fluoroscopic Guidance  Patient: Jeffery [name]      Date of Birth: [birth year] MRN: [id number] PCP: Kathyrn Lass, MD      Visit Date: 05/15/2022   Universal Protocol:     Consent Given By: the patient  Position: PRONE  Additional Comments: Vital signs were monitored before and after the procedure. Patient was prepped and draped in the usual sterile fashion. The correct patient, procedure, and site was verified.   Injection Procedure Details:   Procedure diagnoses: Lumbar radiculopathy [M54.16]   Meds Administered:  Meds ordered this encounter   Medications   methylPREDNISolone acetate (DEPO-MEDROL) injection 80 mg     Laterality: Left  Location/Site:  L5-S1  Needle: 3.5 in., 20 ga. Tuohy  Needle Placement: Paramedian epidural  Findings:   -Comments: Excellent flow of contrast into the epidural space.  Procedure Details: Using a paramedian approach from the side mentioned above, the region overlying the inferior lamina was localized under fluoroscopic visualization and the soft tissues overlying this structure were infiltrated with 4 ml. of 1% Lidocaine without Epinephrine. The Tuohy needle was inserted into the epidural space using a paramedian approach.   The epidural space was localized using loss of resistance along with counter oblique bi-planar fluoroscopic views.  After negative aspirate for air, blood, and CSF, a 2 ml. volume of Isovue-250 was injected into the epidural space and the flow of contrast was observed. Radiographs were obtained for documentation purposes.    The injectate was administered into the level noted above.   Additional Comments:  The patient tolerated the procedure well Dressing: 2 x 2 sterile gauze and Band-Aid    Post-procedure details: Patient was observed during the procedure. Post-procedure instructions were reviewed.  Patient left the clinic in stable condition.   Clinical History: EXAM: MRI LUMBAR SPINE WITHOUT CONTRAST   TECHNIQUE: Multiplanar, multisequence MR imaging of the lumbar spine was performed. No intravenous contrast was administered.   COMPARISON:  Lumbar spine radiographs 02/28/2022   FINDINGS: Segmentation:  Standard.   Alignment: Slight left convex curvature of the lumbar spine. Trace retrolisthesis of L5 on S1.   Vertebrae: No fracture  or suspicious marrow lesion. Moderate degenerative endplate edema at D1-V6.   Conus medullaris and cauda equina: Conus extends to the L2 level. Conus and cauda equina appear normal.   Paraspinal and other soft  tissues: Unremarkable.   Disc levels:   T11-12: Only imaged sagittally. Mild disc bulging and facet arthrosis result in mild left neural foraminal stenosis without evidence of significant spinal stenosis.   T12-L1: Negative.   L1-2: Minimal leftward disc bulging and mild facet hypertrophy without stenosis.   L2-3: Mild left eccentric disc bulging and mild facet hypertrophy without stenosis.   L3-4: Disc desiccation and mild disc space narrowing. Circumferential disc bulging, a small right paracentral to subarticular disc protrusion, and mild facet hypertrophy result in borderline spinal stenosis, moderate right and mild left lateral recess stenosis, and mild-to-moderate right greater than left neural foraminal stenosis.   L4-5: Disc desiccation and mild disc space narrowing. Left eccentric disc bulging, a small left central disc protrusion with annular fissure, and moderate facet hypertrophy result in borderline spinal stenosis, mild-to-moderate left lateral recess stenosis, and mild-to-moderate bilateral neural foraminal stenosis. The disc protrusion may affect the left L5 nerve root.   L5-S1: Disc desiccation and severe disc space narrowing. Disc bulging, a small left subarticular disc protrusion, endplate spurring, and mild facet hypertrophy result in moderate left lateral recess stenosis and mild bilateral neural foraminal stenosis without spinal stenosis. Potential left S1 nerve root impingement.   IMPRESSION: 1. Multilevel lumbar disc and facet degeneration with multiple small disc protrusions. 2. Moderate left lateral recess stenosis at L5-S1 and mild-to-moderate left lateral recess stenosis at L4-5. 3. Mild-to-moderate multilevel neural foraminal stenosis as above.     Electronically Signed   By: Logan Bores M.D.   On: 03/16/2022 11:22     Objective:  VS:  HT:    WT:   BMI:     BP:(!) 156/78  HR:79bpm  TEMP: ( )  RESP:  Physical Exam Vitals and  nursing note reviewed.  Constitutional:      General: He is not in acute distress.    Appearance: Normal appearance. He is not ill-appearing.  HENT:     Head: Normocephalic and atraumatic.     Right Ear: External ear normal.     Left Ear: External ear normal.     Nose: No congestion.  Eyes:     Extraocular Movements: Extraocular movements intact.  Cardiovascular:     Rate and Rhythm: Normal rate.     Pulses: Normal pulses.  Pulmonary:     Effort: Pulmonary effort is normal. No respiratory distress.  Abdominal:     General: There is no distension.     Palpations: Abdomen is soft.  Musculoskeletal:        General: No tenderness or signs of injury.     Cervical back: Neck supple.     Right lower leg: No edema.     Left lower leg: No edema.     Comments: Patient has good distal strength without clonus.  Skin:    Findings: No erythema or rash.  Neurological:     General: No focal deficit present.     Mental Status: He is alert and oriented to person, place, and time.     Sensory: No sensory deficit.     Motor: No weakness or abnormal muscle tone.     Coordination: Coordination normal.  Psychiatric:        Mood and Affect: Mood normal.        Behavior:  Behavior normal.      Imaging: No results found.

## 2022-05-22 NOTE — Procedures (Signed)
Lumbar Epidural Steroid Injection - Interlaminar Approach with Fluoroscopic Guidance  Patient: Jeffery [name]      Date of Birth: Jun 07, 1961 MRN: [id number] PCP: Kathyrn Lass, MD      Visit Date: 05/15/2022   Universal Protocol:     Consent Given By: the patient  Position: PRONE  Additional Comments: Vital signs were monitored before and after the procedure. Patient was prepped and draped in the usual sterile fashion. The correct patient, procedure, and site was verified.   Injection Procedure Details:   Procedure diagnoses: Lumbar radiculopathy [M54.16]   Meds Administered:  Meds ordered this encounter  Medications   methylPREDNISolone acetate (DEPO-MEDROL) injection 80 mg     Laterality: Left  Location/Site:  L5-S1  Needle: 3.5 in., 20 ga. Tuohy  Needle Placement: Paramedian epidural  Findings:   -Comments: Excellent flow of contrast into the epidural space.  Procedure Details: Using a paramedian approach from the side mentioned above, the region overlying the inferior lamina was localized under fluoroscopic visualization and the soft tissues overlying this structure were infiltrated with 4 ml. of 1% Lidocaine without Epinephrine. The Tuohy needle was inserted into the epidural space using a paramedian approach.   The epidural space was localized using loss of resistance along with counter oblique bi-planar fluoroscopic views.  After negative aspirate for air, blood, and CSF, a 2 ml. volume of Isovue-250 was injected into the epidural space and the flow of contrast was observed. Radiographs were obtained for documentation purposes.    The injectate was administered into the level noted above.   Additional Comments:  The patient tolerated the procedure well Dressing: 2 x 2 sterile gauze and Band-Aid    Post-procedure details: Patient was observed during the procedure. Post-procedure instructions were reviewed.  Patient left the clinic in stable condition.

## 2022-05-28 ENCOUNTER — Other Ambulatory Visit: Payer: Self-pay | Admitting: Physical Medicine and Rehabilitation

## 2022-05-29 ENCOUNTER — Ambulatory Visit: Payer: 59 | Admitting: Physical Medicine and Rehabilitation

## 2022-05-29 ENCOUNTER — Encounter: Payer: Self-pay | Admitting: Physical Medicine and Rehabilitation

## 2022-05-29 DIAGNOSIS — G8929 Other chronic pain: Secondary | ICD-10-CM | POA: Diagnosis not present

## 2022-05-29 DIAGNOSIS — M545 Low back pain, unspecified: Secondary | ICD-10-CM | POA: Diagnosis not present

## 2022-05-29 DIAGNOSIS — M47816 Spondylosis without myelopathy or radiculopathy, lumbar region: Secondary | ICD-10-CM | POA: Diagnosis not present

## 2022-05-29 MED ORDER — ACETAMINOPHEN-CODEINE 300-30 MG PO TABS: 1.0000 | ORAL_TABLET | Freq: Three times a day (TID) | ORAL | 0 refills | Status: DC | PRN

## 2022-05-29 NOTE — Progress Notes (Signed)
Numeric Pain Rating Scale and Functional Assessment Average Pain 10   In the last MONTH (on 0-10 scale) has pain interfered with the following?  1. General activity like being  able to carry out your everyday physical activities such as walking, climbing stairs, carrying groceries, or moving a chair?  Rating(10)  Pain is starting to radiate down both legs to the knee. Heating pad helps with pain

## 2022-05-29 NOTE — Progress Notes (Unsigned)
Jeffery [name] - 61 y.o. male MRN [id number]  Date of birth: 1961/02/17  Office Visit Note: Visit Date: 05/29/2022 PCP: Kathyrn Lass, MD Referred by: Kathyrn Lass, MD  Subjective: Chief Complaint  Patient presents with   Lower Back - Pain   HPI: Jeffery [name] is a 61 y.o. male who comes in today for evaluation of chronic, worsening and severe bilateral lower back pain with intermittent radiation to bilateral posterior thighs. Most severe pain is to lower back. Pain ongoing for several years and is exacerbated by standing, movement and activity. He reports severe when when trying to get out of bed in mornings, also when moving from sitting to standing position. He reports difficulty sleeping at night due to severe pain. Some relief of pain with home exercise regimen, heating pad, rest and use of medications. Some relief of pain with Tylenol #3. History of previous chiropractic treatments with Dr. Marrion Coy, reports some relief of pain with these treatments. Recent lumbar MRI imaging exhibits multilevel lumbar disc and facet degeneration with multiple small disc protrusions, moderate left lateral recess stenosis at L5-S1 and mild-to-moderate left lateral recess stenosis at L4-5.  No high-grade spinal canal stenosis noted. Patient was previously treated by Dr. Suella Broad and Dr. Marlaine Hind, history of lumbar epidural steroid injections in 2005 with significant relief of pain. Recently patient underwent (2) left L5-S1 interlaminar injections in our office which provided good short term relief of pain, about 1-2 weeks. Patient reports severe pain is negatively impacting his daily life, states he is not able to exercise due to increased lower back discomfort. Patient denies focal weakness, numbness and tingling. Patient denies recent trauma or falls.    Review of Systems  Musculoskeletal:  Positive for back pain.  Neurological:  Negative for tingling, sensory change, focal weakness and  weakness.  All other systems reviewed and are negative.  Otherwise per HPI.  Assessment & Plan: Visit Diagnoses:    ICD-10-CM   1. Chronic bilateral low back pain without sciatica  M54.50 Ambulatory referral to Physical Medicine Rehab   G89.29     2. Spondylosis without myelopathy or radiculopathy, lumbar region  M47.816 Ambulatory referral to Physical Medicine Rehab    3. Facet hypertrophy of lumbar region  M47.816 Ambulatory referral to Physical Medicine Rehab       Plan: Findings:  Chronic, worsening and severe bilateral lower back pain, intermittent radiation to bilateral posterior thighs. Lower back pain is most severe. Patient continues to have severe pain despite good conservative therapies such as formal chiropractic treatments, home exercise regimen, rest and use of medications. Patients clinical presentation and exam are consistent with facet mediated pain, could be a facet joint syndrome that has recently declared itself as lower back seems to be most severe source of pain. Severe pain noted with lumbar extension upon exam today. Short term relief of pain with recent lumbar epidural steroid injections. Next step is to perform diagnostic bilateral L4-L5 and L5-S1 medial branch blocks under fluoroscopic guidance. If good relief of pain with diagnostic medial branch blocks we did discuss possibility of longer sustained relief of pain with radiofrequency ablation procedure. I did provide patient with educational literature regarding ablation procedure to take home and review. I did refill short course of Tylenol #3 for moderate/severe pain. If pain persists post medial branch blocks we did discuss re-grouping with chiropractor/physical therapy. We also feel he would benefit from possible surgical consultation with Dr. Ileene Rubens to discuss options. Patient encouraged to  remain active as tolerated. No red flag symptoms noted upon exam today.   Patient has undergone chiropractic treatments  and continues with directed home exercise program. Current medication management is not beneficial in increasing his functional status.  Please note that procedures are done as part of a comprehensive orthopedic and pain management program with access to in-house orthopedics, spine surgery and physical therapy as well as access to [facility] biopsychosocial counseling if needed.       Meds & Orders:  Meds ordered this encounter  Medications   acetaminophen-codeine (TYLENOL #3) 300-30 MG tablet    Sig: Take 1 tablet by mouth every 8 (eight) hours as needed for moderate pain or severe pain.    Dispense:  15 tablet    Refill:  0    Orders Placed This Encounter  Procedures   Ambulatory referral to Physical Medicine Rehab    Follow-up: Return for Bilateral L4-L5 and L5-S1 medial branch blocks.   Procedures: No procedures performed      Clinical History: EXAM: MRI LUMBAR SPINE WITHOUT CONTRAST   TECHNIQUE: Multiplanar, multisequence MR imaging of the lumbar spine was performed. No intravenous contrast was administered.   COMPARISON:  Lumbar spine radiographs 02/28/2022   FINDINGS: Segmentation:  Standard.   Alignment: Slight left convex curvature of the lumbar spine. Trace retrolisthesis of L5 on S1.   Vertebrae: No fracture or suspicious marrow lesion. Moderate degenerative endplate edema at Z6-O2.   Conus medullaris and cauda equina: Conus extends to the L2 level. Conus and cauda equina appear normal.   Paraspinal and other soft tissues: Unremarkable.   Disc levels:   T11-12: Only imaged sagittally. Mild disc bulging and facet arthrosis result in mild left neural foraminal stenosis without evidence of significant spinal stenosis.   T12-L1: Negative.   L1-2: Minimal leftward disc bulging and mild facet hypertrophy without stenosis.   L2-3: Mild left eccentric disc bulging and mild facet hypertrophy without stenosis.   L3-4: Disc desiccation and  mild disc space narrowing. Circumferential disc bulging, a small right paracentral to subarticular disc protrusion, and mild facet hypertrophy result in borderline spinal stenosis, moderate right and mild left lateral recess stenosis, and mild-to-moderate right greater than left neural foraminal stenosis.   L4-5: Disc desiccation and mild disc space narrowing. Left eccentric disc bulging, a small left central disc protrusion with annular fissure, and moderate facet hypertrophy result in borderline spinal stenosis, mild-to-moderate left lateral recess stenosis, and mild-to-moderate bilateral neural foraminal stenosis. The disc protrusion may affect the left L5 nerve root.   L5-S1: Disc desiccation and severe disc space narrowing. Disc bulging, a small left subarticular disc protrusion, endplate spurring, and mild facet hypertrophy result in moderate left lateral recess stenosis and mild bilateral neural foraminal stenosis without spinal stenosis. Potential left S1 nerve root impingement.   IMPRESSION: 1. Multilevel lumbar disc and facet degeneration with multiple small disc protrusions. 2. Moderate left lateral recess stenosis at L5-S1 and mild-to-moderate left lateral recess stenosis at L4-5. 3. Mild-to-moderate multilevel neural foraminal stenosis as above.     Electronically Signed   By: Logan Bores M.D.   On: 03/16/2022 11:22   He reports that he has never smoked. He has never used smokeless tobacco. No results for input(s): "HGBA1C", "LABURIC" in the last 8760 hours.  Objective:  VS:  HT:    WT:   BMI:     BP:   HR: bpm  TEMP: ( )  RESP:  Physical Exam Vitals and nursing  note reviewed.  HENT:     Head: Normocephalic and atraumatic.     Right Ear: External ear normal.     Left Ear: External ear normal.     Nose: Nose normal.     Mouth/Throat:     Mouth: Mucous membranes are moist.  Eyes:     Extraocular Movements: Extraocular movements intact.  Cardiovascular:      Rate and Rhythm: Normal rate.     Pulses: Normal pulses.  Pulmonary:     Effort: Pulmonary effort is normal.  Abdominal:     General: Abdomen is flat. There is no distension.  Musculoskeletal:        General: Tenderness present.     Cervical back: Normal range of motion.     Comments: Pt rises from seated position to standing without difficulty. Concordant low back pain with facet loading, lumbar spine extension and rotation. Strong distal strength without clonus, no pain upon palpation of greater trochanters. Sensation intact bilaterally. Walks independently, gait steady.   Skin:    General: Skin is warm and dry.     Capillary Refill: Capillary refill takes less than 2 seconds.  Neurological:     General: No focal deficit present.     Mental Status: He is alert and oriented to person, place, and time.  Psychiatric:        Mood and Affect: Mood normal.        Behavior: Behavior normal.     Ortho Exam  Imaging: No results found.  Past Medical/Family/Surgical/Social History: Medications & Allergies reviewed per EMR, new medications updated. 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   Nuclear sclerotic cataract of both eyes 05/12/2019   Diabetes mellitus ([place]) 09/11/2018   Hypertension 09/11/2018   Hyperthyroidism 09/11/2018   Lipoma of abdominal wall 02/12/2012   Past Medical History:  Diagnosis Date   Anal fissure    Diabetes mellitus without complication ([place])    Fatty liver    Hyperplastic rectal polyp    Hypertension    Lipoma    Family History  Problem Relation Age of Onset   Diabetes Mother    Diabetes Father    Past Surgical History:  Procedure Laterality Date   CATARACT EXTRACTION Left    CERVICAL [place] ARTHROPLASTY  07/23/2005   C-5 and C-6   SHOULDER SURGERY  2008 and 2012   left in 2008,  right had rtc tear 2012   Social History   Occupational History   Not on file  Tobacco Use   Smoking status: Never   Smokeless tobacco: Never  Substance and Sexual Activity   Alcohol use: No   Drug use: No   Sexual activity: Not on file

## 2022-06-11 ENCOUNTER — Ambulatory Visit: Payer: 59 | Admitting: Physical Medicine and Rehabilitation

## 2022-06-11 ENCOUNTER — Ambulatory Visit: Payer: Self-pay

## 2022-06-11 VITALS — BP 161/83 | HR 99

## 2022-06-11 DIAGNOSIS — M47816 Spondylosis without myelopathy or radiculopathy, lumbar region: Secondary | ICD-10-CM

## 2022-06-11 MED ORDER — BUPIVACAINE HCL 0.5 % IJ SOLN
3.0000 mL | Freq: Once | INTRAMUSCULAR | Status: AC
  Administered 2022-06-11: 3 mL

## 2022-06-11 NOTE — Patient Instructions (Signed)

## 2022-06-11 NOTE — Progress Notes (Signed)
Bilateral L4-5 L5-S medial branch.

## 2022-06-13 ENCOUNTER — Telehealth: Payer: Self-pay | Admitting: Physical Medicine and Rehabilitation

## 2022-06-13 NOTE — Telephone Encounter (Signed)
Patient called. Returning a call to [place].

## 2022-06-18 ENCOUNTER — Emergency Department (HOSPITAL_COMMUNITY)
Admission: EM | Admit: 2022-06-18 | Discharge: 2022-06-19 | Disposition: A | Discharge status: Home / Self Care | Payer: 59 | Attending: Emergency Medicine | Admitting: Emergency Medicine

## 2022-06-18 ENCOUNTER — Other Ambulatory Visit: Payer: Self-pay

## 2022-06-18 ENCOUNTER — Telehealth: Payer: Self-pay | Admitting: Physical Medicine and Rehabilitation

## 2022-06-18 ENCOUNTER — Emergency Department (HOSPITAL_COMMUNITY): Payer: 59

## 2022-06-18 DIAGNOSIS — E119 Type 2 diabetes mellitus without complications: Secondary | ICD-10-CM | POA: Diagnosis not present

## 2022-06-18 DIAGNOSIS — R0689 Other abnormalities of breathing: Secondary | ICD-10-CM | POA: Diagnosis not present

## 2022-06-18 DIAGNOSIS — M545 Low back pain, unspecified: Secondary | ICD-10-CM

## 2022-06-18 DIAGNOSIS — G8929 Other chronic pain: Secondary | ICD-10-CM | POA: Diagnosis not present

## 2022-06-18 DIAGNOSIS — M5126 Other intervertebral disc displacement, lumbar region: Secondary | ICD-10-CM | POA: Insufficient documentation

## 2022-06-18 DIAGNOSIS — M48061 Spinal stenosis, lumbar region without neurogenic claudication: Secondary | ICD-10-CM | POA: Diagnosis not present

## 2022-06-18 DIAGNOSIS — M5127 Other intervertebral disc displacement, lumbosacral region: Secondary | ICD-10-CM | POA: Diagnosis not present

## 2022-06-18 DIAGNOSIS — T68XXXA Hypothermia, initial encounter: Secondary | ICD-10-CM | POA: Diagnosis not present

## 2022-06-18 DIAGNOSIS — Z79899 Other long term (current) drug therapy: Secondary | ICD-10-CM | POA: Diagnosis not present

## 2022-06-18 DIAGNOSIS — I10 Essential (primary) hypertension: Secondary | ICD-10-CM | POA: Insufficient documentation

## 2022-06-18 DIAGNOSIS — M5136 Other intervertebral disc degeneration, lumbar region: Secondary | ICD-10-CM | POA: Diagnosis not present

## 2022-06-18 DIAGNOSIS — I959 Hypotension, unspecified: Secondary | ICD-10-CM | POA: Diagnosis not present

## 2022-06-18 LAB — CBC WITH DIFFERENTIAL/PLATELET
Abs Immature Granulocytes: 0.02 10*3/uL (ref 0.00–0.07)
Basophils Absolute: 0 10*3/uL (ref 0.0–0.1)
Basophils Relative: 0 %
Eosinophils Absolute: 0.1 10*3/uL (ref 0.0–0.5)
Eosinophils Relative: 1 %
HCT: 41.3 % (ref 39.0–52.0)
Hemoglobin: 13.2 g/dL (ref 13.0–17.0)
Immature Granulocytes: 0 %
Lymphocytes Relative: 31 %
Lymphs Abs: 2.8 10*3/uL (ref 0.7–4.0)
MCH: 26.2 pg (ref 26.0–34.0)
MCHC: 32 g/dL (ref 30.0–36.0)
MCV: 81.9 fL (ref 80.0–100.0)
Monocytes Absolute: 0.9 10*3/uL (ref 0.1–1.0)
Monocytes Relative: 10 %
Neutro Abs: 5.4 10*3/uL (ref 1.7–7.7)
Neutrophils Relative %: 58 %
Platelets: 302 10*3/uL (ref 150–400)
RBC: 5.04 MIL/uL (ref 4.22–5.81)
RDW: 13.2 % (ref 11.5–15.5)
WBC: 9.2 10*3/uL (ref 4.0–10.5)
nRBC: 0 % (ref 0.0–0.2)

## 2022-06-18 LAB — COMPREHENSIVE METABOLIC PANEL
ALT: 27 U/L (ref 0–44)
AST: 21 U/L (ref 15–41)
Albumin: 3.5 g/dL (ref 3.5–5.0)
Alkaline Phosphatase: 120 U/L (ref 38–126)
Anion gap: 15 (ref 5–15)
BUN: 13 mg/dL (ref 8–23)
CO2: 23 mmol/L (ref 22–32)
Calcium: 9.3 mg/dL (ref 8.9–10.3)
Chloride: 99 mmol/L (ref 98–111)
Creatinine, Ser: 1.16 mg/dL (ref 0.61–1.24)
GFR, Estimated: >60 mL/min (ref >60)
Glucose, Bld: 203 mg/dL — ABNORMAL HIGH (ref 70–99)
Potassium: 3.4 mmol/L — ABNORMAL LOW (ref 3.5–5.1)
Sodium: 137 mmol/L (ref 135–145)
Total Bilirubin: 0.4 mg/dL (ref 0.3–1.2)
Total Protein: 6.6 g/dL (ref 6.5–8.1)

## 2022-06-18 LAB — SEDIMENTATION RATE: Sed Rate: 11 mm/hr (ref 0–16)

## 2022-06-18 LAB — C-REACTIVE PROTEIN: CRP: 1 mg/dL — ABNORMAL HIGH (ref <1.0)

## 2022-06-18 MED ORDER — LORAZEPAM 2 MG/ML IJ SOLN
1.0000 mg | Freq: Once | INTRAMUSCULAR | Status: AC
  Administered 2022-06-18: 1 mg via INTRAVENOUS
  Filled 2022-06-18: qty 1

## 2022-06-18 MED ORDER — OXYCODONE-ACETAMINOPHEN 5-325 MG PO TABS
2.0000 | ORAL_TABLET | Freq: Once | ORAL | Status: AC
  Administered 2022-06-18: 2 via ORAL
  Filled 2022-06-18: qty 2

## 2022-06-18 MED ORDER — GADOBUTROL 1 MMOL/ML IV SOLN: 10.0000 mL | Freq: Once | INTRAVENOUS | Status: DC | PRN

## 2022-06-18 MED ORDER — LORAZEPAM 1 MG PO TABS: 1.0000 mg | ORAL_TABLET | Freq: Once | ORAL | Status: DC

## 2022-06-18 MED ORDER — GADOBUTROL 1 MMOL/ML IV SOLN
10.0000 mL | Freq: Once | INTRAVENOUS | Status: AC | PRN
  Administered 2022-06-18: 10 mL via INTRAVENOUS

## 2022-06-18 NOTE — Procedures (Signed)
Lumbar Diagnostic Facet Joint Nerve Block with Fluoroscopic Guidance   Patient: Jeffery [name]      Date of Birth: 10-17-1960 MRN: [id number] PCP: Kathyrn Lass, MD      Visit Date: 06/11/2022   Universal Protocol:    Date/Time: 11/27/238:37 PM  Consent Given By: the patient  Position: PRONE  Additional Comments: Vital signs were monitored before and after the procedure. Patient was prepped and draped in the usual sterile fashion. The correct patient, procedure, and site was verified.   Injection Procedure Details:   Procedure diagnoses:  1. Spondylosis without myelopathy or radiculopathy, lumbar region      Meds Administered:  Meds ordered this encounter  Medications   bupivacaine (MARCAINE) 0.5 % (with pres) injection 3 mL     Laterality: Bilateral  Location/Site: L4-L5, L3 and L4 medial branches and L5-S1, L4 medial branch and L5 dorsal ramus  Needle: 5.0 in., 25 ga.  Short bevel or Quincke spinal needle  Needle Placement: Oblique pedical  Findings:   -Comments: There was excellent flow of contrast along the articular pillars without intravascular flow.  Patient has significant muscular pain with needling during the procedure.  25-gauge needle there was significant tightness and spasm.  Procedure Details: The fluoroscope beam is vertically oriented in AP and then obliqued 15 to 20 degrees to the ipsilateral side of the desired nerve to achieve the "Scotty dog" appearance.  The skin over the target area of the junction of the superior articulating process and the transverse process (sacral ala if blocking the L5 dorsal rami) was locally anesthetized with a 1 ml volume of 1% Lidocaine without Epinephrine.  The spinal needle was inserted and advanced in a trajectory view down to the target.   After contact with periosteum and negative aspirate for blood and CSF, correct placement without intravascular or epidural spread was confirmed by injecting 0.5 ml. of Isovue-250.   A spot radiograph was obtained of this image.    Next, a 0.5 ml. volume of the injectate described above was injected. The needle was then redirected to the other facet joint nerves mentioned above if needed.  Prior to the procedure, the patient was given a Pain Diary which was completed for baseline measurements.  After the procedure, the patient rated their pain every 30 minutes and will continue rating at this frequency for a total of 5 hours.  The patient has been asked to complete the Diary and return to [place] by mail, fax or hand delivered as soon as possible.   Additional Comments:  No complications occurred Dressing: 2 x 2 sterile gauze and Band-Aid    Post-procedure details: Patient was observed during the procedure. Post-procedure instructions were reviewed.  Patient left the clinic in stable condition.

## 2022-06-18 NOTE — ED Triage Notes (Addendum)
Pt states he has chronic back problems for which he has had multiple treatments/injections.  Pt is awaiting surgery.  Pt had sudden "catching" pain and is unable to move himself.  Was placed from bed to stretcher by fire.  He is in a recliner now.  20G RAC  Has had 100 mcg fentanyl. Pt took a tylenol 3 prior to leaving home with EMS

## 2022-06-18 NOTE — Progress Notes (Signed)
Jeffery [name] - 61 y.o. male MRN [id number]  Date of birth: 1960-12-11  Office Visit Note: Visit Date: 06/11/2022 PCP: Kathyrn Lass, MD Referred by: Kathyrn Lass, MD  Subjective: Chief Complaint  Patient presents with   Spine - Pain   HPI:  Jeffery [name] is a 61 y.o. male who comes in today at the request of Barnet Pall, FNP for planned Bilateral  L4-5 and L5-S1 Lumbar facet/medial branch block with fluoroscopic guidance.  The patient has failed conservative care including home exercise, medications, time and activity modification.  This injection will be diagnostic and hopefully therapeutic.  Please see requesting physician notes for further details and justification.  Exam has shown concordant pain with facet joint loading.   ROS Otherwise per HPI.  Assessment & Plan: Visit Diagnoses:    ICD-10-CM   1. Spondylosis without myelopathy or radiculopathy, lumbar region  M47.816 XR C-ARM NO REPORT    Facet Injection    bupivacaine (MARCAINE) 0.5 % (with pres) injection 3 mL      Plan: No additional findings.   Meds & Orders:  Meds ordered this encounter  Medications   bupivacaine (MARCAINE) 0.5 % (with pres) injection 3 mL    Orders Placed This Encounter  Procedures   Facet Injection   XR C-ARM NO REPORT    Follow-up: Return for Review Pain Diary.   Procedures: No procedures performed  Lumbar Diagnostic Facet Joint Nerve Block with Fluoroscopic Guidance   Patient: Jeffery [name]      Date of Birth: 1961/02/26 MRN: [id number] PCP: Kathyrn Lass, MD      Visit Date: 06/11/2022   Universal Protocol:    Date/Time: 11/27/238:37 PM  Consent Given By: the patient  Position: PRONE  Additional Comments: Vital signs were monitored before and after the procedure. Patient was prepped and draped in the usual sterile fashion. The correct patient, procedure, and site was verified.   Injection Procedure Details:   Procedure diagnoses:  1. Spondylosis without  myelopathy or radiculopathy, lumbar region      Meds Administered:  Meds ordered this encounter  Medications   bupivacaine (MARCAINE) 0.5 % (with pres) injection 3 mL     Laterality: Bilateral  Location/Site: L4-L5, L3 and L4 medial branches and L5-S1, L4 medial branch and L5 dorsal ramus  Needle: 5.0 in., 25 ga.  Short bevel or Quincke spinal needle  Needle Placement: Oblique pedical  Findings:   -Comments: There was excellent flow of contrast along the articular pillars without intravascular flow.  Patient has significant muscular pain with needling during the procedure.  25-gauge needle there was significant tightness and spasm.  Procedure Details: The fluoroscope beam is vertically oriented in AP and then obliqued 15 to 20 degrees to the ipsilateral side of the desired nerve to achieve the "Scotty dog" appearance.  The skin over the target area of the junction of the superior articulating process and the transverse process (sacral ala if blocking the L5 dorsal rami) was locally anesthetized with a 1 ml volume of 1% Lidocaine without Epinephrine.  The spinal needle was inserted and advanced in a trajectory view down to the target.   After contact with periosteum and negative aspirate for blood and CSF, correct placement without intravascular or epidural spread was confirmed by injecting 0.5 ml. of Isovue-250.  A spot radiograph was obtained of this image.    Next, a 0.5 ml. volume of the injectate described above was injected. The needle was then redirected to the other  facet joint nerves mentioned above if needed.  Prior to the procedure, the patient was given a Pain Diary which was completed for baseline measurements.  After the procedure, the patient rated their pain every 30 minutes and will continue rating at this frequency for a total of 5 hours.  The patient has been asked to complete the Diary and return to [place] by mail, fax or hand delivered as soon as possible.   Additional  Comments:  No complications occurred Dressing: 2 x 2 sterile gauze and Band-Aid    Post-procedure details: Patient was observed during the procedure. Post-procedure instructions were reviewed.  Patient left the clinic in stable condition.   Clinical History: EXAM: MRI LUMBAR SPINE WITHOUT CONTRAST   TECHNIQUE: Multiplanar, multisequence MR imaging of the lumbar spine was performed. No intravenous contrast was administered.   COMPARISON:  Lumbar spine radiographs 02/28/2022   FINDINGS: Segmentation:  Standard.   Alignment: Slight left convex curvature of the lumbar spine. Trace retrolisthesis of L5 on S1.   Vertebrae: No fracture or suspicious marrow lesion. Moderate degenerative endplate edema at T5-T7.   Conus medullaris and cauda equina: Conus extends to the L2 level. Conus and cauda equina appear normal.   Paraspinal and other soft tissues: Unremarkable.   Disc levels:   T11-12: Only imaged sagittally. Mild disc bulging and facet arthrosis result in mild left neural foraminal stenosis without evidence of significant spinal stenosis.   T12-L1: Negative.   L1-2: Minimal leftward disc bulging and mild facet hypertrophy without stenosis.   L2-3: Mild left eccentric disc bulging and mild facet hypertrophy without stenosis.   L3-4: Disc desiccation and mild disc space narrowing. Circumferential disc bulging, a small right paracentral to subarticular disc protrusion, and mild facet hypertrophy result in borderline spinal stenosis, moderate right and mild left lateral recess stenosis, and mild-to-moderate right greater than left neural foraminal stenosis.   L4-5: Disc desiccation and mild disc space narrowing. Left eccentric disc bulging, a small left central disc protrusion with annular fissure, and moderate facet hypertrophy result in borderline spinal stenosis, mild-to-moderate left lateral recess stenosis, and mild-to-moderate bilateral neural foraminal  stenosis. The disc protrusion may affect the left L5 nerve root.   L5-S1: Disc desiccation and severe disc space narrowing. Disc bulging, a small left subarticular disc protrusion, endplate spurring, and mild facet hypertrophy result in moderate left lateral recess stenosis and mild bilateral neural foraminal stenosis without spinal stenosis. Potential left S1 nerve root impingement.   IMPRESSION: 1. Multilevel lumbar disc and facet degeneration with multiple small disc protrusions. 2. Moderate left lateral recess stenosis at L5-S1 and mild-to-moderate left lateral recess stenosis at L4-5. 3. Mild-to-moderate multilevel neural foraminal stenosis as above.     Electronically Signed   By: Logan Bores M.D.   On: 03/16/2022 11:22     Objective:  VS:  HT:    WT:   BMI:     BP:(!) 161/83  HR:99bpm  TEMP: ( )  RESP:  Physical Exam Vitals and nursing note reviewed.  Constitutional:      General: He is not in acute distress.    Appearance: Normal appearance. He is well-developed. He is not ill-appearing.  HENT:     Head: Normocephalic and atraumatic.     Right Ear: External ear normal.     Left Ear: External ear normal.     Nose: No congestion.  Eyes:     Extraocular Movements: Extraocular movements intact.     Conjunctiva/sclera: Conjunctivae normal.  Pupils: Pupils are equal, round, and reactive to light.  Cardiovascular:     Rate and Rhythm: Normal rate.     Pulses: Normal pulses.     Heart sounds: Normal heart sounds.  Pulmonary:     Effort: Pulmonary effort is normal. No respiratory distress.  Abdominal:     General: There is no distension.     Palpations: Abdomen is soft.  Musculoskeletal:        General: No tenderness or signs of injury.     Cervical back: Normal range of motion and neck supple. No rigidity.     Right lower leg: No edema.     Left lower leg: No edema.     Comments: Patient has good distal strength without clonus.  Skin:    General: Skin  is warm and dry.     Findings: No erythema or rash.  Neurological:     General: No focal deficit present.     Mental Status: He is alert and oriented to person, place, and time.     Sensory: No sensory deficit.     Motor: No weakness or abnormal muscle tone.     Coordination: Coordination normal.     Gait: Gait normal.  Psychiatric:        Mood and Affect: Mood normal.        Behavior: Behavior normal.      Imaging: No results found.

## 2022-06-18 NOTE — ED Notes (Signed)
In MRI. Will get vitals when patient returns

## 2022-06-18 NOTE — Telephone Encounter (Signed)
Pt called requesting a call back pt states concerning his pain diary. Pt phone number is [phone].

## 2022-06-18 NOTE — ED Provider Triage Note (Signed)
Emergency Medicine Provider Triage Evaluation Note  Jeffery [name] , a 61 y.o. male  was evaluated in triage.  Pt complains of back pain. Hx of herniated discs. Epidural injection 1 week ago, No fevers. No red flags.  Review of Systems  Positive: Back pain Negative: fever  Physical Exam  There were no vitals taken for this visit. Gen:   Awake, no distress   Resp:  Normal effort  MSK:   Moves extremities without difficulty  Other:    Medical Decision Making  Medically screening exam initiated at 7:13 PM.  Appropriate orders placed.  Jeffery [name] was informed that the remainder of the evaluation will be completed by another provider, this initial triage assessment does not replace that evaluation, and the importance of remaining in the ED until their evaluation is complete.     Margarita Mail, PA-C 06/18/22 1922

## 2022-06-19 ENCOUNTER — Other Ambulatory Visit: Payer: Self-pay | Admitting: Physical Medicine and Rehabilitation

## 2022-06-19 DIAGNOSIS — M47816 Spondylosis without myelopathy or radiculopathy, lumbar region: Secondary | ICD-10-CM

## 2022-06-19 DIAGNOSIS — M545 Low back pain, unspecified: Secondary | ICD-10-CM

## 2022-06-19 MED ORDER — PREDNISONE 10 MG (21) PO TBPK: ORAL_TABLET | Freq: Every day | ORAL | 0 refills | Status: DC

## 2022-06-19 MED ORDER — KETOROLAC TROMETHAMINE 15 MG/ML IJ SOLN
15.0000 mg | Freq: Once | INTRAMUSCULAR | Status: AC
  Administered 2022-06-19: 15 mg via INTRAVENOUS
  Filled 2022-06-19: qty 1

## 2022-06-19 MED ORDER — DEXAMETHASONE SODIUM PHOSPHATE 10 MG/ML IJ SOLN
10.0000 mg | Freq: Once | INTRAMUSCULAR | Status: AC
  Administered 2022-06-19: 10 mg via INTRAVENOUS
  Filled 2022-06-19: qty 1

## 2022-06-19 MED ORDER — MELOXICAM 15 MG PO TABS: 15.0000 mg | ORAL_TABLET | Freq: Every day | ORAL | 0 refills | Status: DC

## 2022-06-19 NOTE — Discharge Instructions (Addendum)
You were evaluated today for back pain.  Your imaging shows small, unchanged disc protrusions in the lumbar region.  I have prescribed a steroid Dosepak and an anti-inflammatory.  Please take as prescribed.  Please consider taking Benadryl if needed if you experience any mild allergic reactions and if you experience any severe reactions please come to the emergency department.  Please follow-up with your orthopedic doctor for further evaluation and management as needed

## 2022-06-19 NOTE — Progress Notes (Signed)
I spoke with patient this morning, reports minimal relief with recent diagnostic medial branch blocks. Previous lumbar epidural steroid injections did not provide significant relief of pain. He is currently being seen in ED due to severe back pain, some relief of pain with pain medications. New lumbar MRI imaging appears to be unchanged from previous, no evidence of infection. I spoke with patient regarding next steps. We do not recommend repeating lumbar injections at this time. Could look at physical therapy/massage therapy. Also spoke with him about chronic pain management and possible consult with surgeon. He will keep [place] updated as any new concerns arise.

## 2022-06-19 NOTE — ED Provider Notes (Signed)
[facility] EMERGENCY DEPARTMENT Provider Note   CSN: [id number] Arrival date & time: 06/18/22  1742     History  Chief Complaint  Patient presents with   Back Pain    Jeffery [name] is a 61 y.o. male.  Patient with history of chronic back problems who currently sees a specialist for same presents the emergency department via ambulance complaining of worsening pain.  He states that he had a sudden catching pain after resting yesterday and was unable to move at the time.  EMS had to help him move from the bed to a stretcher.  EMS administered 100 mcg of fentanyl during transport and the patient took a Tylenol 3 prior to leaving home.  Patient denies any new injury or trauma.  Patient denies saddle anesthesia, urinary incontinence, fecal incontinence, urinary retention.  Patient with history of hypertension, type II DM, spondylosis without myelopathy or radiculopathy, lumbar region  HPI     Home Medications Prior to Admission medications   Medication Sig Start Date End Date Taking? Authorizing Provider  meloxicam (MOBIC) 15 MG tablet Take 1 tablet (15 mg total) by mouth daily. 06/19/22  Yes Dorothyann Peng, PA-C  predniSONE (STERAPRED UNI-PAK 21 TAB) 10 MG (21) TBPK tablet Take by mouth daily. Take 6 tabs by mouth daily  for 2 days, then 5 tabs for 2 days, then 4 tabs for 2 days, then 3 tabs for 2 days, 2 tabs for 2 days, then 1 tab by mouth daily for 2 days 06/19/22  Yes Cherlynn June B, PA-C  acetaminophen-codeine (TYLENOL #3) 300-30 MG tablet Take 1 tablet by mouth every 8 (eight) hours as needed for moderate pain or severe pain. 05/29/22   Lorine Bears, NP  amLODipine (NORVASC) 5 MG tablet Take 5 mg by mouth daily.      [provider]  Coenzyme Q10 (CO Q 10 PO) Take 1 tablet by mouth daily.    [provider]  diazepam (VALIUM) 5 MG tablet Take 1 tablet 30 to 60 minutes prior to MRI scan.  Do not operate motor vehicle while taking this  medication. 03/16/22   Magnant, Charles L, PA-C  hydrochlorothiazide (HYDRODIURIL) 25 MG tablet Take 25 mg by mouth daily. 07/03/21   [provider]  indomethacin (INDOCIN) 25 MG capsule take 1-2 capsules by mouth three times a day if needed for JOINT PAIN 03/18/17   [provider]  losartan-hydrochlorothiazide (HYZAAR) 100-25 MG tablet Take 1 tablet by mouth daily. 02/26/17   [provider]  methimazole (TAPAZOLE) 10 MG tablet Take 1.5 tablets (15 mg total) by mouth daily. 11/09/21   Shamleffer, Melanie Crazier, MD  methocarbamol (ROBAXIN) 500 MG tablet Take 1 tablet (500 mg total) by mouth every 8 (eight) hours as needed for muscle spasms. 03/14/22   Magnant, Charles L, PA-C  Omega 3 1000 MG CAPS 1 capsule    [provider]  omega-3 acid ethyl esters (LOVAZA) 1 G capsule Take 1 g by mouth daily.    [provider]  rosuvastatin (CRESTOR) 10 MG tablet Take 10 mg by mouth at bedtime. 09/06/21   [provider]  traMADol (ULTRAM) 50 MG tablet Take 1 tablet (50 mg total) by mouth every 12 (twelve) hours as needed. 03/09/22 03/09/23  Magnant, Charles L, PA-C  valsartan-hydrochlorothiazide (DIOVAN-HCT) 320-25 MG per tablet Take 1 tablet by mouth daily.      [provider]      Allergies    Other  and Nsaids    Review of Systems   Review of Systems  Musculoskeletal:  Positive for back pain.    Physical Exam Updated Vital Signs BP (!) 154/91 (BP Location: Left Arm)   Pulse 80   Temp 98.6 F (37 C)   Resp 18   SpO2 99%  Physical Exam Vitals and nursing note reviewed.  Constitutional:      General: He is not in acute distress.    Appearance: He is well-developed.  HENT:     Head: Normocephalic and atraumatic.  Eyes:     Conjunctiva/sclera: Conjunctivae normal.  Cardiovascular:     Rate and Rhythm: Normal rate and regular rhythm.     Heart sounds: No murmur heard. Pulmonary:     Effort: Pulmonary effort is normal. No  respiratory distress.     Breath sounds: Normal breath sounds.  Abdominal:     Palpations: Abdomen is soft.     Tenderness: There is no abdominal tenderness.  Musculoskeletal:        General: No swelling, tenderness or signs of injury.     Cervical back: Neck supple.     Right lower leg: No edema.     Left lower leg: No edema.     Comments: No midline tenderness in the lumbar region, mild bilateral tenderness in the lumbar region  Skin:    General: Skin is warm and dry.     Capillary Refill: Capillary refill takes less than 2 seconds.  Neurological:     General: No focal deficit present.     Mental Status: He is alert.     Sensory: No sensory deficit.     Motor: No weakness.     Coordination: Coordination normal.     Gait: Gait normal.  Psychiatric:        Mood and Affect: Mood normal.     ED Results / Procedures / Treatments   Labs (all labs ordered are listed, but only abnormal results are displayed) Labs Reviewed  COMPREHENSIVE METABOLIC PANEL - Abnormal; Notable for the following components:      Result Value   Potassium 3.4 (*)    Glucose, Bld 203 (*)    All other components within normal limits  C-REACTIVE PROTEIN - Abnormal; Notable for the following components:   CRP 1.0 (*)    All other components within normal limits  CBC WITH DIFFERENTIAL/PLATELET  SEDIMENTATION RATE    EKG None  Radiology MR Lumbar Spine W Wo Contrast  Result Date: 06/18/2022 CLINICAL DATA:  Acute myelopathy EXAM: MRI LUMBAR SPINE WITHOUT AND WITH CONTRAST TECHNIQUE: Multiplanar and multiecho pulse sequences of the lumbar spine were obtained without and with intravenous contrast. CONTRAST:  76m GADAVIST GADOBUTROL 1 MMOL/ML IV SOLN COMPARISON:  None Available. FINDINGS: Segmentation:  Standard. Alignment:  Physiologic. Vertebrae: Type 1 Modic endplate signal changes at L5-S1 mild contrast enhancement the disc spaces. No acute abnormality. Conus medullaris and cauda equina: Conus extends  to the L2 level. Conus and cauda equina appear normal. Paraspinal and other soft tissues: Negative. Disc levels: L1-L2: Normal disc space and facet joints. No spinal canal stenosis. No neural foraminal stenosis. L2-L3: Normal disc space and facet joints. No spinal canal stenosis. No neural foraminal stenosis. L3-L4: Unchanged small right subarticular disc protrusion. Right lateral recess narrowing without central spinal canal stenosis. No neural foraminal stenosis. L4-L5: Unchanged small left subarticular disc protrusion. Left lateral recess narrowing without central spinal canal stenosis. Unchanged mild bilateral neural foraminal stenosis. L5-S1: Disc space narrowing  and left asymmetric bulge with superimposed left subarticular protrusion. No spinal canal stenosis. No neural foraminal stenosis. Visualized sacrum: Normal. IMPRESSION: 1. No acute abnormality of the lumbar spine. 2. Unchanged small right subarticular disc protrusion at L3-L4 narrowing the right lateral recess. 3. Unchanged small left subarticular disc protrusion at L4-L5 narrowing the left lateral recess. Mild bilateral L4 neural foraminal stenosis. 4. Unchanged left subarticular disc protrusion at L5-S1 without associated stenosis. Electronically Signed   By: Ulyses Jarred M.D.   On: 06/18/2022 22:02    Procedures Procedures    Medications Ordered in ED Medications  ketorolac (TORADOL) 15 MG/ML injection 15 mg (has no administration in time range)  dexamethasone (DECADRON) injection 10 mg (has no administration in time range)  oxyCODONE-acetaminophen (PERCOCET/ROXICET) 5-325 MG per tablet 2 tablet (2 tablets Oral Given 06/18/22 1954)  LORazepam (ATIVAN) injection 1 mg (1 mg Intravenous Given 06/18/22 2104)  gadobutrol (GADAVIST) 1 MMOL/ML injection 10 mL (10 mLs Intravenous Contrast Given 06/18/22 2142)    ED Course/ Medical Decision Making/ A&P                           Medical Decision Making Risk Prescription drug  management.   This patient presents to the ED for concern of back pain, this involves an extensive number of treatment options, and is a complaint that carries with it a high risk of complications and morbidity.  The differential diagnosis includes herniated disc, fracture, dislocation, radiculopathy, and others   Co morbidities that complicate the patient evaluation  History of spondylosis of the lumbar region, type II DM   Additional history obtained:  Additional history obtained from EMS, patient's wife External records from outside source obtained and reviewed including orthopedic notes documenting recent treatments for spondylosis   Lab Tests:  I Ordered, and personally interpreted labs.  The pertinent results include: Grossly unremarkable CRP, sed rate, CBC, CMP   Imaging Studies ordered:  I ordered imaging studies including MRI lumbar spine I independently visualized and interpreted imaging which showed  1. No acute abnormality of the lumbar spine.  2. Unchanged small right subarticular disc protrusion at L3-L4  narrowing the right lateral recess.  3. Unchanged small left subarticular disc protrusion at L4-L5  narrowing the left lateral recess. Mild bilateral L4 neural  foraminal stenosis.  4. Unchanged left subarticular disc protrusion at L5-S1 without  associated stenosis.   I agree with the radiologist interpretation   Problem List / ED Course / Critical interventions / Medication management   I ordered medication including Percocet, Toradol, Decadron for back pain  Reevaluation of the patient after these medicines showed that the patient improved I have reviewed the patients home medicines and have made adjustments as needed   Test / Admission - Considered:  The patient appears to have a worsening of his chronic subarticular disc protrusions.  No cauda equina on MRI.  No red flag symptoms to suggest cauda equina.  Patient currently has an orthopedic doctor  who is managing his condition and has no interest at this time and surgery.  Plan to discharge patient home with course of prednisone and meloxicam and have him follow-up with his current orthopedic specialist.  Prior to ordering or administering medications patient assured me that his allergies were minor to NSAIDs and oral steroids.        Final Clinical Impression(s) / ED Diagnoses Final diagnoses:  Protrusion of lumbar intervertebral disc  Chronic bilateral low back pain  without sciatica    Rx / DC Orders ED Discharge Orders          Ordered    predniSONE (STERAPRED UNI-PAK 21 TAB) 10 MG (21) TBPK tablet  Daily        06/19/22 1042    meloxicam (MOBIC) 15 MG tablet  Daily        06/19/22 1042              Ronny Bacon 06/19/22 1042    Godfrey Pick, MD 06/19/22 [phone]

## 2022-06-20 ENCOUNTER — Telehealth: Payer: Self-pay | Admitting: Surgical

## 2022-06-20 MED ORDER — METHOCARBAMOL 500 MG PO TABS: 500.0000 mg | ORAL_TABLET | Freq: Three times a day (TID) | ORAL | 0 refills | Status: DC | PRN

## 2022-06-20 NOTE — Telephone Encounter (Signed)
From: Janus Molder To: Office of [place], [place] Sent: 06/19/2022 8:40 PM EST Subject: Medication Renewal Request  Refills have been requested for the following medications:   methocarbamol (ROBAXIN) 500 MG tablet [Luke Munira Polson]  Preferred pharmacy: [facility] [place], [place] - [address] AT [place] Delivery method: Arlyss Gandy

## 2022-06-20 NOTE — Telephone Encounter (Signed)
IC LMVM

## 2022-06-20 NOTE — Addendum Note (Signed)
Addended byLaurann Montana on: 06/20/2022 05:15 PM   Modules accepted: Orders

## 2022-06-20 NOTE — Telephone Encounter (Signed)
Pt called in stating that the medication (methocarbamol (ROBAXIN) 500 MG tablet) was sent to the wrong pharmacy... Pt stated that the correct pharmacy is [facility] on [address]... Pt requesting for a new script to [facility] to [facility].. Pt requesting callback.Marland KitchenMarland Kitchen

## 2022-06-29 ENCOUNTER — Encounter: Payer: Self-pay | Admitting: Physical Therapy

## 2022-06-29 ENCOUNTER — Ambulatory Visit (INDEPENDENT_AMBULATORY_CARE_PROVIDER_SITE_OTHER): Payer: 59 | Admitting: Physical Therapy

## 2022-06-29 ENCOUNTER — Other Ambulatory Visit: Payer: Self-pay

## 2022-06-29 DIAGNOSIS — R29898 Other symptoms and signs involving the musculoskeletal system: Secondary | ICD-10-CM

## 2022-06-29 DIAGNOSIS — M6281 Muscle weakness (generalized): Secondary | ICD-10-CM | POA: Diagnosis not present

## 2022-06-29 DIAGNOSIS — M5459 Other low back pain: Secondary | ICD-10-CM | POA: Diagnosis not present

## 2022-06-29 NOTE — Therapy (Unsigned)
OUTPATIENT PHYSICAL THERAPY THORACOLUMBAR EVALUATION   Patient Name: Jeffery [name] MRN: [id number] DOB:06-05-1961, 61 y.o., male Today's Date: 06/29/2022  END OF SESSION:    Past Medical History:  Diagnosis Date   Anal fissure    Diabetes mellitus without complication ([place])    Fatty liver    Hyperplastic rectal polyp    Hypertension    Lipoma    Past Surgical History:  Procedure Laterality Date   CATARACT EXTRACTION Left    CERVICAL DISC ARTHROPLASTY  07/23/2005   C-5 and C-6   SHOULDER SURGERY  2008 and 2012   left in 2008, right had rtc tear 2012   Patient Active Problem List   Diagnosis Date Noted   Elevated alkaline phosphatase level 11/09/2021   Morbid obesity ([place]) 06/27/2019   BMI 36.0-36.9,adult 06/27/2019   Bergmeister papilla 05/12/2019   Nuclear sclerotic cataract of both eyes 05/12/2019   Diabetes mellitus ([place]) 09/11/2018   Hypertension 09/11/2018   Hyperthyroidism 09/11/2018   Lipoma of abdominal wall 02/12/2012    PCP: Okey Dupre, MD  REFERRING PROVIDER: Lorine Bears, NP  REFERRING DIAG: [phone] (ICD-10-CM) - Chronic bilateral low back pain without sciatica M47.816 (ICD-10-CM) - Spondylosis without myelopathy or radiculopathy, lumbar region M47.816 (ICD-10-CM) - Facet hypertrophy of lumbar region  Rationale for Evaluation and Treatment: Rehabilitation  THERAPY DIAG:  No diagnosis found.  ONSET DATE: chronic (since 2003) with acute exacerbation in July 2023  SUBJECTIVE:                                                                                                                                                                                           SUBJECTIVE STATEMENT: Pt is a 61 y/o male who presents to OPPT for chronic LBP.  He's had several injections in the past, and was recently treated in the ED for severe back pain.  Recent injections have provided minimal relief at this time.  He reports he has 3 herniated  discs and increased muscle spasms and wants to try PT before considering surgery.  PERTINENT HISTORY:  C5-6 cervical disc arthroplasty, bil shoulder surgeries, DM, HTN  PAIN:  Are you having pain? Yes: NPRS scale: 6 currently, up to 15, at best 5/10 Pain location: low back, radiates to hips Pain description: sharp, "like I'm being stuck with an electric prod" Aggravating factors: sudden movements Relieving factors: medication  PRECAUTIONS: None  WEIGHT BEARING RESTRICTIONS: No  FALLS:  Has patient fallen in last 6 months? 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 No acute abnormality of the lumbar spine. 2. Unchanged small right subarticular disc protrusion at L3-L4 narrowing the right lateral recess. 3. Unchanged small left subarticular disc protrusion at L4-L5 narrowing the left lateral recess. Mild bilateral L4 neural foraminal stenosis. 4. Unchanged left subarticular disc protrusion at L5-S1 without associated stenosis.  PATIENT SURVEYS:  06/29/22: FOTO 39 (predicted 71)  SCREENING FOR RED FLAGS: Bowel or bladder incontinence: Yes: improved this week Spinal tumors: No Cauda equina syndrome: No Compression fracture: No Abdominal aneurysm: No  COGNITION: Overall cognitive status: Within functional limits for tasks assessed     SENSATION: WFL  MUSCLE LENGTH: Hamstrings: WNL bil  POSTURE: rounded shoulders and forward head  PALPATION: 06/29/22: trigger points in Rt QL and piriformis; hypomobility L4/5  LUMBAR ROM:   AROM eval  Flexion WNL  Extension WNL with Rt side pain  Right lateral flexion WNL  Left lateral  flexion WNL  Right Quadrant WNL with discomfort  Left Quadrant Rt side sharp pain   (Blank rows = not tested)   LOWER EXTREMITY MMT:    MMT Right eval Left eval  Hip flexion 5/5 5/5  Hip extension 3-/5 (pain) 3/5  Hip abduction 3/5 3/5  Knee flexion 5/5 5/5  Knee extension 5/5 5/5   (Blank rows = not tested)  LUMBAR SPECIAL TESTS:  Slump test: Negative   GAIT: Comments: No significant abnormalities noted today  TODAY'S TREATMENT:                                                                                                                              DATE:  06/29/22 See HEP - demonstrated and performed trial reps PRN for comprehension  Discussed exercise progression and working on slowly returning to exercise   PATIENT EDUCATION:  Education details: HEP Person educated: Patient Education method: Consulting civil engineer, Media planner, and Handouts Education comprehension: verbalized understanding, returned demonstration, and needs further education  HOME EXERCISE PROGRAM: Access Code: [email or web address] URL: [email or web address] Date: 06/29/2022 Prepared by: Faustino Congress  Exercises - Supine Piriformis Stretch with Foot on Ground  - 1-2 x daily - 7 x weekly - 1 sets - 3 reps - 30 sec hold - Prone on Elbows Stretch  - 1-2 x daily - 7 x weekly - 1 sets - 1 reps - 3 min hold - Sidelying Hip Circles  - 1-2 x daily - 7 x weekly - 2-3 sets - 10 reps - Supine Bridge  - 1-2 x daily - 7 x weekly - 2-3 sets - 10 reps - 5 sec hold  Patient Education - Trigger Point Dry Needling  ASSESSMENT:  CLINICAL IMPRESSION: Patient is a 61 y.o. male who was seen today for physical therapy evaluation and treatment for chronic LBP.  He  demonstrates decreased strength and trigger points as well as hypomobility of lumbar spine affecting functional mobility.  He will benefit from PT to address deficits listed.   OBJECTIVE IMPAIRMENTS: decreased strength, hypomobility, increased fascial  restrictions, increased muscle spasms, postural dysfunction, and pain.   ACTIVITY LIMITATIONS: carrying, lifting, bending, standing, squatting, and locomotion level  PARTICIPATION LIMITATIONS: meal prep, cleaning, laundry, community activity, occupation, and yard work  PERSONAL FACTORS: 3+ comorbidities: C5-6 cervical disc arthroplasty, bil shoulder surgeries, DM, HTN  are also affecting patient's functional outcome.   REHAB POTENTIAL: Good  CLINICAL DECISION MAKING: Evolving/moderate complexity  EVALUATION COMPLEXITY: Moderate   GOALS: Goals reviewed with patient? Yes  SHORT TERM GOALS: Target date: 07/20/2022  Independent with initial HEP Goal status: INITIAL   LONG TERM GOALS: Target date: 08/10/2022  Independent with final HEP Goal status: INITIAL  2.  FOTO score improved to 54 Goal status: INITIAL  3.  Bil hip strength improved to 4+/5 in limited groups for improved strength and mobility Goal status: INIITAL  4.  Report pain < 3/10 with walking and exercise activities for improved function Goal status: INITIAL  5.  Report centralization of symptoms for improved function Goal status: INITIAL   PLAN:  PT FREQUENCY: 1x/week  PT DURATION: 6 weeks  PLANNED INTERVENTIONS: Therapeutic exercises, Therapeutic activity, Neuromuscular re-education, Gait training, Patient/Family education, Self Care, Joint mobilization, Aquatic Therapy, Dry Needling, Electrical stimulation, Spinal manipulation, Spinal mobilization, Cryotherapy, Moist heat, Taping, Traction, Ionotophoresis '4mg'$ /ml Dexamethasone, Manual therapy, and Re-evaluation.  PLAN FOR NEXT SESSION: DN to Rt QL, piriformis and multifidi; review/update HEP PRN   Rudi Heap PT, DPT 06/29/22  10:21 AM

## 2022-06-29 NOTE — Therapy (Signed)
OUTPATIENT PHYSICAL THERAPY THORACOLUMBAR EVALUATION   Patient Name: Jeffery [name] MRN: [id number] DOB:1961/03/26, 61 y.o., male Today's Date: 06/29/2022  END OF SESSION:  PT End of Session - 06/29/22 1011     Visit Number 1    Number of Visits 6    Date for PT Re-Evaluation 08/10/22    Authorization Type [employer]    Authorization - Number of Visits 28    PT Start Time [phone]    PT Stop Time 1008    PT Time Calculation (min) 40 min    Activity Tolerance Patient tolerated treatment well    Behavior During Therapy [facility] for tasks assessed/performed             Past Medical History:  Diagnosis Date   Anal fissure    Diabetes mellitus without complication ([place])    Fatty liver    Hyperplastic rectal polyp    Hypertension    Lipoma    Past Surgical History:  Procedure Laterality Date   CATARACT EXTRACTION Left    CERVICAL [place] ARTHROPLASTY  07/23/2005   C-5 and C-6   SHOULDER SURGERY  2008 and 2012   left in 2008, right had rtc tear 2012   Patient Active Problem List   Diagnosis Date Noted   Elevated alkaline phosphatase level 11/09/2021   Morbid obesity ([place]) 06/27/2019   BMI 36.0-36.9,adult 06/27/2019   Bergmeister papilla 05/12/2019   Nuclear sclerotic cataract of both eyes 05/12/2019   Diabetes mellitus ([place]) 09/11/2018   Hypertension 09/11/2018   Hyperthyroidism 09/11/2018   Lipoma of abdominal wall 02/12/2012    PCP: Okey Dupre, MD  REFERRING PROVIDER: Lorine Bears, NP  REFERRING DIAG: [phone] (ICD-10-CM) - Chronic bilateral low back pain without sciatica M47.816 (ICD-10-CM) - Spondylosis without myelopathy or radiculopathy, lumbar region M47.816 (ICD-10-CM) - Facet hypertrophy of lumbar region  Rationale for Evaluation and Treatment: Rehabilitation  THERAPY DIAG:  Other low back pain - Plan: PT plan of care cert/re-cert  Other symptoms and signs involving the musculoskeletal system - Plan: PT plan of care cert/re-cert  Muscle  weakness (generalized) - Plan: PT plan of care cert/re-cert  ONSET DATE: chronic (since 2003) with acute exacerbation in July 2023  SUBJECTIVE:                                                                                                                                                                                           SUBJECTIVE STATEMENT: Pt is a 61 y/o male who presents to OPPT for chronic LBP.  He's had several injections in the past, and was recently treated in the ED for severe back pain.  Recent injections have provided minimal relief at this time.  He reports he has 3 herniated discs and increased muscle spasms and wants to try PT before considering surgery.  PERTINENT HISTORY:  C5-6 cervical disc arthroplasty, bil shoulder surgeries, DM, HTN  PAIN:  Are you having pain? Yes: NPRS scale: 6 currently, up to 15, at best 5/10 Pain location: low back, radiates to hips Pain description: sharp, "like I'm being stuck with an electric prod" Aggravating factors: sudden movements Relieving factors: medication  PRECAUTIONS: None  WEIGHT BEARING RESTRICTIONS: No  FALLS:  Has patient fallen in last 6 months? 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 No acute abnormality of the lumbar spine. 2. Unchanged small right subarticular disc protrusion at L3-L4 narrowing the right lateral recess. 3. Unchanged small left subarticular disc protrusion at L4-L5 narrowing the left lateral recess. Mild bilateral L4 neural foraminal stenosis. 4. Unchanged left subarticular disc protrusion at L5-S1 without associated stenosis.  PATIENT SURVEYS:   06/29/22: FOTO 39 (predicted 24)  SCREENING FOR RED FLAGS: Bowel or bladder incontinence: Yes: improved this week Spinal tumors: No Cauda equina syndrome: No Compression fracture: No Abdominal aneurysm: No  COGNITION: Overall cognitive status: Within functional limits for tasks assessed     SENSATION: WFL  MUSCLE LENGTH: Hamstrings: WNL bil  POSTURE: rounded shoulders and forward head  PALPATION: 06/29/22: trigger points in Rt QL and piriformis; hypomobility L4/5  LUMBAR ROM:   AROM eval  Flexion WNL  Extension WNL with Rt side pain  Right lateral flexion WNL  Left lateral flexion WNL  Right Quadrant WNL with discomfort  Left Quadrant Rt side sharp pain   (Blank rows = not tested)   LOWER EXTREMITY MMT:    MMT Right eval Left eval  Hip flexion 5/5 5/5  Hip extension 3-/5 (pain) 3/5  Hip abduction 3/5 3/5  Knee flexion 5/5 5/5  Knee extension 5/5 5/5   (Blank rows = not tested)  LUMBAR SPECIAL TESTS:  Slump test: Negative   GAIT: Comments: No significant abnormalities noted today  TODAY'S TREATMENT:                                                                                                                              DATE:  06/29/22 See HEP - demonstrated and performed trial reps PRN for comprehension  Discussed exercise progression and working on slowly returning to exercise   PATIENT EDUCATION:  Education details: HEP Person educated: Patient Education method: Consulting civil engineer, Media planner, and Handouts Education comprehension: verbalized understanding, returned demonstration, and needs further education  HOME EXERCISE PROGRAM: Access Code: [email or web address] URL: [email or web address] Date: 06/29/2022 Prepared by:  Faustino Congress  Exercises - Supine Piriformis Stretch with Foot on Ground  - 1-2 x daily - 7 x weekly - 1 sets - 3 reps - 30 sec hold - Prone on Elbows Stretch  - 1-2 x daily - 7 x weekly - 1 sets - 1 reps - 3 min hold -  Sidelying Hip Circles  - 1-2 x daily - 7 x weekly - 2-3 sets - 10 reps - Supine Bridge  - 1-2 x daily - 7 x weekly - 2-3 sets - 10 reps - 5 sec hold  Patient Education - Trigger Point Dry Needling  ASSESSMENT:  CLINICAL IMPRESSION: Patient is a 61 y.o. male who was seen today for physical therapy evaluation and treatment for chronic LBP.  He demonstrates decreased strength and trigger points as well as hypomobility of lumbar spine affecting functional mobility.  He will benefit from PT to address deficits listed.   OBJECTIVE IMPAIRMENTS: decreased strength, hypomobility, increased fascial restrictions, increased muscle spasms, postural dysfunction, and pain.   ACTIVITY LIMITATIONS: carrying, lifting, bending, standing, squatting, and locomotion level  PARTICIPATION LIMITATIONS: meal prep, cleaning, laundry, community activity, occupation, and yard work  PERSONAL FACTORS: 3+ comorbidities: C5-6 cervical disc arthroplasty, bil shoulder surgeries, DM, HTN  are also affecting patient's functional outcome.   REHAB POTENTIAL: Good  CLINICAL DECISION MAKING: Evolving/moderate complexity  EVALUATION COMPLEXITY: Moderate   GOALS: Goals reviewed with patient? Yes  SHORT TERM GOALS: Target date: 07/20/2022  Independent with initial HEP Goal status: INITIAL   LONG TERM GOALS: Target date: 08/10/2022  Independent with final HEP Goal status: INITIAL  2.  FOTO score improved to 54 Goal status: INITIAL  3.  Bil hip strength improved to 4+/5 in limited groups for improved strength and mobility Goal status: INIITAL  4.  Report pain < 3/10 with walking and exercise activities for improved function Goal status: INITIAL  5.  Report centralization of symptoms for improved function Goal status: INITIAL   PLAN:  PT FREQUENCY: 1x/week  PT DURATION: 6 weeks  PLANNED INTERVENTIONS: Therapeutic exercises, Therapeutic activity, Neuromuscular re-education, Gait training, Patient/Family  education, Self Care, Joint mobilization, Aquatic Therapy, Dry Needling, Electrical stimulation, Spinal manipulation, Spinal mobilization, Cryotherapy, Moist heat, Taping, Traction, Ionotophoresis '4mg'$ /ml Dexamethasone, Manual therapy, and Re-evaluation.  PLAN FOR NEXT SESSION: DN to Rt QL, piriformis and multifidi; review/update HEP PRN   Laureen Abrahams, PT, DPT 06/29/22 10:13 AM

## 2022-07-02 ENCOUNTER — Ambulatory Visit (INDEPENDENT_AMBULATORY_CARE_PROVIDER_SITE_OTHER): Payer: 59 | Admitting: Physical Therapy

## 2022-07-02 ENCOUNTER — Encounter: Payer: Self-pay | Admitting: Physical Therapy

## 2022-07-02 DIAGNOSIS — M6281 Muscle weakness (generalized): Secondary | ICD-10-CM

## 2022-07-02 DIAGNOSIS — R29898 Other symptoms and signs involving the musculoskeletal system: Secondary | ICD-10-CM | POA: Diagnosis not present

## 2022-07-02 DIAGNOSIS — M5459 Other low back pain: Secondary | ICD-10-CM

## 2022-07-08 ENCOUNTER — Other Ambulatory Visit: Payer: Self-pay | Admitting: Surgical

## 2022-07-08 MED ORDER — MELOXICAM 15 MG PO TABS
15.0000 mg | ORAL_TABLET | Freq: Every day | ORAL | 0 refills | Status: DC
Start: 2022-07-08 — End: 2022-07-19

## 2022-07-11 ENCOUNTER — Ambulatory Visit (INDEPENDENT_AMBULATORY_CARE_PROVIDER_SITE_OTHER): Payer: 59 | Admitting: Physical Therapy

## 2022-07-11 ENCOUNTER — Encounter: Payer: Self-pay | Admitting: Physical Therapy

## 2022-07-11 DIAGNOSIS — R29898 Other symptoms and signs involving the musculoskeletal system: Secondary | ICD-10-CM

## 2022-07-11 DIAGNOSIS — M6281 Muscle weakness (generalized): Secondary | ICD-10-CM

## 2022-07-11 DIAGNOSIS — M5459 Other low back pain: Secondary | ICD-10-CM

## 2022-07-11 NOTE — Therapy (Addendum)
OUTPATIENT PHYSICAL THERAPY TREATMENT  Patient Name: Jeffery [name] MRN: [id number] DOB:[birth year], 61 y.o., male Today's Date: 07/11/2022  END OF SESSION:  PT End of Session - 07/11/22 1320     Visit Number 3    Number of Visits 6    Date for PT Re-Evaluation 08/10/22    Authorization Type [employer]    Authorization - Number of Visits 35    PT Start Time 1300    PT Stop Time 1345    PT Time Calculation (min) 45 min    Activity Tolerance Patient tolerated treatment well    Behavior During Therapy WFL for tasks assessed/performed              Past Medical History:  Diagnosis Date   Anal fissure    Diabetes mellitus without complication ([place])    Fatty liver    Hyperplastic rectal polyp    Hypertension    Lipoma    Past Surgical History:  Procedure Laterality Date   CATARACT EXTRACTION Left    CERVICAL [place] ARTHROPLASTY  07/23/2005   C-5 and C-6   SHOULDER SURGERY  2008 and 2012   left in 2008, right had rtc tear 2012   Patient Active Problem List   Diagnosis Date Noted   Elevated alkaline phosphatase level 11/09/2021   Morbid obesity ([place]) 06/27/2019   BMI 36.0-36.9,adult 06/27/2019   Bergmeister papilla 05/12/2019   Nuclear sclerotic cataract of both eyes 05/12/2019   Diabetes mellitus ([place]) 09/11/2018   Hypertension 09/11/2018   Hyperthyroidism 09/11/2018   Lipoma of abdominal wall 02/12/2012    PCP: Okey Dupre, MD  REFERRING PROVIDER: Lorine Bears, NP  REFERRING DIAG: [phone] (ICD-10-CM) - Chronic bilateral low back pain without sciatica M47.816 (ICD-10-CM) - Spondylosis without myelopathy or radiculopathy, lumbar region M47.816 (ICD-10-CM) - Facet hypertrophy of lumbar region  Rationale for Evaluation and Treatment: Rehabilitation  THERAPY DIAG:  Other low back pain  Other symptoms and signs involving the musculoskeletal system  Muscle weakness (generalized)  ONSET DATE: chronic (since 2003) with acute exacerbation in July  2023  SUBJECTIVE:                                                                                                                                                                                           SUBJECTIVE STATEMENT: Pt states he is feeling better overall, the DN has been helping  PERTINENT HISTORY:  C5-6 cervical disc arthroplasty, bil shoulder surgeries, DM, HTN  PAIN:  Are you having pain? Yes: NPRS scale: 6 currently /10 Pain location: low back, radiates to hips Pain description: sharp, "like I'm being stuck with an  electric prod" Aggravating factors: sudden movements Relieving factors: medication  PRECAUTIONS: None  WEIGHT BEARING RESTRICTIONS: No  FALLS:  Has patient fallen in last 6 months? 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 No acute abnormality of the lumbar spine. 2. Unchanged small right subarticular disc protrusion at L3-L4 narrowing the right lateral recess. 3. Unchanged small left subarticular disc protrusion at L4-L5 narrowing the left lateral recess. Mild bilateral L4 neural foraminal stenosis. 4. Unchanged left subarticular disc protrusion at L5-S1 without associated stenosis.  PATIENT SURVEYS:  06/29/22: FOTO 39 (predicted 6)  SCREENING FOR RED FLAGS: Bowel or bladder incontinence: Yes: improved this week Spinal tumors: No Cauda equina syndrome: No Compression fracture: No Abdominal aneurysm: No  COGNITION: Overall cognitive status: Within functional limits for tasks assessed     SENSATION: WFL  MUSCLE LENGTH: Hamstrings: WNL bil  POSTURE: rounded shoulders and forward  head  PALPATION: 06/29/22: trigger points in Rt QL and piriformis; hypomobility L4/5  LUMBAR ROM:   AROM eval  Flexion WNL  Extension WNL with Rt side pain  Right lateral flexion WNL  Left lateral flexion WNL  Right Quadrant WNL with discomfort  Left Quadrant Rt side sharp pain   (Blank rows = not tested)   LOWER EXTREMITY MMT:    MMT Right eval Left eval  Hip flexion 5/5 5/5  Hip extension 3-/5 (pain) 3/5  Hip abduction 3/5 3/5  Knee flexion 5/5 5/5  Knee extension 5/5 5/5   (Blank rows = not tested)  LUMBAR SPECIAL TESTS:  Slump test: Negative   GAIT: Comments: No significant abnormalities noted today  TODAY'S TREATMENT:                                                                                                                              DATE:  07/11/2022:  - Assessed pt lower back for appropriate dry needling.  - Manual therapy for skilled palpation and active compression with Trigger Point Dry-Needling  Treatment instructions: Expect mild to moderate muscle soreness. S/S of pneumothorax if dry needled over a lung field, and to seek immediate medical attention should they occur. Patient verbalized understanding of these instructions and education.  Patient Consent Given: Yes Education handout provided: No Muscles treated: R QL, multifidi, and R piriformis  Electrical stimulation performed: No Parameters:  Treatment response/outcome: good overall tolerance noted  -Moist heat to low back and Rt glutes with leg elevated after DN to warm up muscles before stretching (7 total minutes and not included in treatment time) -Piriformis stretch on Rt 30 sec X 2 figure 4 and X 2 knee to opposite shoulder - LTR  10 sec X 5 bilat - beginner bridges X 10 -Supine clams blue 2X10 -Standing hip extensions X 10 bilat -Standing hip abductions X 10 bilat  TODAY'S TREATMENT:                                                                                                                               DATE:  07/02/2022:  - Discussed referral patterns of pain utilizing images and palpation.  - Assessed pt lower back for appropriate dry needling.  - STM to lower back following dry needling with significant tension still present.  - LTR x20  - PPT x20  - HS stretch 2x30 sec   - Trigger Point Dry-Needling  Treatment instructions: Expect mild to moderate muscle soreness. S/S of pneumothorax if dry needled over a lung field, and to seek immediate medical attention should they occur. Patient verbalized understanding of these instructions and education.  Patient Consent Given: Yes Education handout provided: No Muscles treated: R QL, multifidi, and R piriformis  Electrical stimulation performed: Yes Parameters:  low amplitude, low hertz.  Treatment response/outcome: Achiness.   06/29/22 See HEP - demonstrated and performed trial reps PRN for comprehension  Discussed exercise progression and working on slowly returning to exercise   PATIENT EDUCATION:  Education details: HEP Person educated: Patient Education method: Consulting civil engineer, Media planner, and Handouts Education comprehension: verbalized understanding, returned demonstration, and needs further education  HOME EXERCISE PROGRAM: Access Code: [email or web address] URL: [email or web address] Date: 06/29/2022 Prepared by: Faustino Congress  Exercises - Supine Piriformis Stretch with Foot on Ground  - 1-2 x daily - 7 x weekly - 1 sets - 3 reps - 30 sec hold - Prone on Elbows Stretch  - 1-2 x daily - 7 x weekly - 1 sets - 1 reps - 3 min hold - Sidelying Hip Circles  - 1-2 x daily - 7 x weekly - 2-3 sets - 10 reps - Supine Bridge  - 1-2 x daily - 7 x weekly - 2-3 sets - 10 reps - 5 sec hold  Patient Education - Trigger Point Dry Needling  ASSESSMENT:  CLINICAL IMPRESSION: He appears to be making progress with PT with his pain and functional abilities. He has been responding well to DN to this was  again performed today followed by heat, stretching, and strengthening exercises as tolerated.    OBJECTIVE IMPAIRMENTS: decreased strength, hypomobility, increased fascial restrictions, increased muscle spasms, postural dysfunction, and pain.   ACTIVITY LIMITATIONS: carrying, lifting, bending, standing, squatting, and locomotion level  PARTICIPATION LIMITATIONS: meal prep, cleaning, laundry, community activity, occupation, and yard work  PERSONAL FACTORS: 3+ comorbidities: C5-6 cervical disc arthroplasty, bil shoulder surgeries, DM, HTN  are also affecting patient's functional outcome.   REHAB POTENTIAL: Good  CLINICAL DECISION MAKING: Evolving/moderate complexity  EVALUATION COMPLEXITY: Moderate   GOALS: Goals reviewed with patient? Yes  SHORT TERM GOALS: Target date: 07/20/2022  Independent with initial HEP Goal status: INITIAL   LONG TERM GOALS: Target  date: 08/10/2022  Independent with final HEP Goal status: INITIAL  2.  FOTO score improved to 54 Goal status: INITIAL  3.  Bil hip strength improved to 4+/5 in limited groups for improved strength and mobility Goal status: INIITAL  4.  Report pain < 3/10 with walking and exercise activities for improved function Goal status: INITIAL  5.  Report centralization of symptoms for improved function Goal status: INITIAL   PLAN:  PT FREQUENCY: 1x/week  PT DURATION: 6 weeks  PLANNED INTERVENTIONS: Therapeutic exercises, Therapeutic activity, Neuromuscular re-education, Gait training, Patient/Family education, Self Care, Joint mobilization, Aquatic Therapy, Dry Needling, Electrical stimulation, Spinal manipulation, Spinal mobilization, Cryotherapy, Moist heat, Taping, Traction, Ionotophoresis '4mg'$ /ml Dexamethasone, Manual therapy, and Re-evaluation.  PLAN FOR NEXT SESSION: DN to Rt QL, piriformis and multifidi; review/update HEP PRN  Elsie Ra, PT, DPT 07/11/22 1:44 PM

## 2022-07-12 DIAGNOSIS — U071 COVID-19: Secondary | ICD-10-CM | POA: Diagnosis not present

## 2022-07-19 ENCOUNTER — Other Ambulatory Visit: Payer: Self-pay | Admitting: Surgical

## 2022-07-23 ENCOUNTER — Other Ambulatory Visit: Payer: Self-pay | Admitting: Orthopedic Surgery

## 2022-07-24 ENCOUNTER — Ambulatory Visit (INDEPENDENT_AMBULATORY_CARE_PROVIDER_SITE_OTHER): Payer: 59 | Admitting: Physical Therapy

## 2022-07-24 ENCOUNTER — Encounter: Payer: Self-pay | Admitting: Physical Therapy

## 2022-07-24 DIAGNOSIS — M5459 Other low back pain: Secondary | ICD-10-CM | POA: Diagnosis not present

## 2022-07-24 DIAGNOSIS — R29898 Other symptoms and signs involving the musculoskeletal system: Secondary | ICD-10-CM | POA: Diagnosis not present

## 2022-07-24 DIAGNOSIS — M6281 Muscle weakness (generalized): Secondary | ICD-10-CM | POA: Diagnosis not present

## 2022-07-24 MED ORDER — METHOCARBAMOL 500 MG PO TABS
500.0000 mg | ORAL_TABLET | Freq: Three times a day (TID) | ORAL | 0 refills | Status: DC | PRN
Start: 2022-07-24 — End: 2022-08-27

## 2022-07-24 NOTE — Therapy (Signed)
OUTPATIENT PHYSICAL THERAPY TREATMENT   Patient Name: Jeffery [name] MRN: [id number] DOB:22-Oct-1960, 62 y.o., male Today's Date: 07/24/2022  END OF SESSION:  PT End of Session - 07/24/22 0931     Visit Number 4    Number of Visits 6    Date for PT Re-Evaluation 08/10/22    Authorization Type [employer]    Authorization - Number of Visits 35    PT Start Time 0845    PT Stop Time 0930    PT Time Calculation (min) 45 min    Activity Tolerance Patient tolerated treatment well    Behavior During Therapy [facility] for tasks assessed/performed               Past Medical History:  Diagnosis Date   Anal fissure    Diabetes mellitus without complication ([place])    Fatty liver    Hyperplastic rectal polyp    Hypertension    Lipoma    Past Surgical History:  Procedure Laterality Date   CATARACT EXTRACTION Left    CERVICAL [place] ARTHROPLASTY  07/23/2005   C-5 and C-6   SHOULDER SURGERY  2008 and 2012   left in 2008, right had rtc tear 2012   Patient Active Problem List   Diagnosis Date Noted   Elevated alkaline phosphatase level 11/09/2021   Morbid obesity ([place]) 06/27/2019   BMI 36.0-36.9,adult 06/27/2019   Bergmeister papilla 05/12/2019   Nuclear sclerotic cataract of both eyes 05/12/2019   Diabetes mellitus ([place]) 09/11/2018   Hypertension 09/11/2018   Hyperthyroidism 09/11/2018   Lipoma of abdominal wall 02/12/2012    PCP: Okey Dupre, MD  REFERRING PROVIDER: Lorine Bears, NP  REFERRING DIAG: [phone] (ICD-10-CM) - Chronic bilateral low back pain without sciatica M47.816 (ICD-10-CM) - Spondylosis without myelopathy or radiculopathy, lumbar region M47.816 (ICD-10-CM) - Facet hypertrophy of lumbar region  Rationale for Evaluation and Treatment: Rehabilitation  THERAPY DIAG:  Other low back pain  Other symptoms and signs involving the musculoskeletal system  Muscle weakness (generalized)  ONSET DATE: chronic (since 2003) with acute exacerbation in July  2023  SUBJECTIVE:                                                                                                                                                                                           SUBJECTIVE STATEMENT: Pt states the back pain is doing okay, consents to have DN today  PERTINENT HISTORY:  C5-6 cervical disc arthroplasty, bil shoulder surgeries, DM, HTN  PAIN:  Are you having pain? Yes: NPRS scale: 7 currently /10 Pain location: low back, radiates to hips Pain description: sharp, "like I'm being  stuck with an electric prod" Aggravating factors: sudden movements Relieving factors: medication  PRECAUTIONS: None  WEIGHT BEARING RESTRICTIONS: No  FALLS:  Has patient fallen in last 6 months? 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 No acute abnormality of the lumbar spine. 2. Unchanged small right subarticular disc protrusion at L3-L4 narrowing the right lateral recess. 3. Unchanged small left subarticular disc protrusion at L4-L5 narrowing the left lateral recess. Mild bilateral L4 neural foraminal stenosis. 4. Unchanged left subarticular disc protrusion at L5-S1 without associated stenosis.  PATIENT SURVEYS:  06/29/22: FOTO 39 (predicted 63)  SCREENING FOR RED FLAGS: Bowel or bladder incontinence: Yes: improved this week Spinal tumors: No Cauda equina syndrome: No Compression fracture: No Abdominal aneurysm: No  COGNITION: Overall cognitive status: Within functional limits for tasks assessed     SENSATION: WFL  MUSCLE LENGTH: Hamstrings: WNL bil  POSTURE: rounded shoulders and forward  head  PALPATION: 06/29/22: trigger points in Rt QL and piriformis; hypomobility L4/5  LUMBAR ROM:   AROM eval  Flexion WNL  Extension WNL with Rt side pain  Right lateral flexion WNL  Left lateral flexion WNL  Right Quadrant WNL with discomfort  Left Quadrant Rt side sharp pain   (Blank rows = not tested)   LOWER EXTREMITY MMT:    MMT Right eval Left eval  Hip flexion 5/5 5/5  Hip extension 3-/5 (pain) 3/5  Hip abduction 3/5 3/5  Knee flexion 5/5 5/5  Knee extension 5/5 5/5   (Blank rows = not tested)  LUMBAR SPECIAL TESTS:  Slump test: Negative   GAIT: Comments: No significant abnormalities noted today  TODAY'S TREATMENT:                                                                                                                              DATE:  07/24/2022:   - Manual therapy for skilled palpation and active compression with Trigger Point Dry-Needling  Treatment instructions: Expect mild to moderate muscle soreness. S/S of pneumothorax if dry needled over a lung field, and to seek immediate medical attention should they occur. Patient verbalized understanding of these instructions and education.  Patient Consent Given: Yes Education handout provided: No Muscles treated: R glutes and  R piriformis  Electrical stimulation performed: No Parameters:  Treatment response/outcome: good overall tolerance noted  -Moist heat to low back and Rt glutes with leg elevated after DN to warm up muscles before stretching (7 total minutes and not included in treatment time)  Therex:  -Piriformis stretch bilat for 30 sec X 2 figure 4 and X 2 knee to opposite shoulder - LTR 10 sec X  5 bilat - beginner bridges X 10 -Supine clams blue 2X10 -Standing lumbar extension X 10 holding 3 sec -Standing hip extensions X 10 bilat -Standing hip abductions X 10 bilat -Standing rows blue X 20 with TAC -Standing extensions blue X 20 with TAC   TODAY'S TREATMENT:                                                                                                                               DATE:  07/11/2022:    - Manual therapy for skilled palpation and active compression with Trigger Point Dry-Needling  Treatment instructions: Expect mild to moderate muscle soreness. S/S of pneumothorax if dry needled over a lung field, and to seek immediate medical attention should they occur. Patient verbalized understanding of these instructions and education.  Patient Consent Given: Yes Education handout provided: No Muscles treated: R QL, multifidi, and R piriformis  Electrical stimulation performed: No Parameters:  Treatment response/outcome: good overall tolerance noted  -Moist heat to low back and Rt glutes with leg elevated after DN to warm up muscles before stretching (7 total minutes and not included in treatment time) -Piriformis stretch on Rt 30 sec X 2 figure 4 and X 2 knee to opposite shoulder - LTR 10 sec X 5 bilat - bridges X 10 -Supine clams blue 2X10 -Standing hip extensions X 10 bilat -Standing hip abductions X 10 bilat   PATIENT EDUCATION:  Education details: HEP Person educated: Patient Education method: Explanation, Demonstration, and Handouts Education comprehension: verbalized understanding, returned demonstration, and needs further education  HOME EXERCISE PROGRAM: Access Code: [email or web address] URL: [email or web address] Date: 06/29/2022 Prepared by: Faustino Congress  Exercises - Supine Piriformis Stretch with Foot on Ground  - 1-2 x daily - 7 x weekly - 1 sets - 3 reps - 30 sec hold - Prone on Elbows Stretch  - 1-2 x daily - 7 x weekly - 1 sets - 1 reps - 3 min hold - Sidelying Hip Circles  - 1-2 x daily - 7 x weekly - 2-3 sets - 10 reps - Supine Bridge  - 1-2 x daily - 7 x weekly - 2-3 sets - 10 reps - 5 sec hold  Patient Education - Trigger Point Dry Needling  ASSESSMENT:  CLINICAL IMPRESSION: Continued with DN as he relays benefit  from this. He showed improved ability/tolerance for bridge exercise today. PT recommending to continue with current    OBJECTIVE IMPAIRMENTS: decreased strength, hypomobility, increased fascial restrictions, increased muscle spasms, postural dysfunction, and pain.   ACTIVITY LIMITATIONS: carrying, lifting, bending, standing, squatting, and locomotion level  PARTICIPATION LIMITATIONS: meal prep, cleaning, laundry, community activity, occupation, and yard work  PERSONAL FACTORS: 3+ comorbidities: C5-6 cervical disc arthroplasty, bil shoulder surgeries, DM, HTN  are also affecting patient's functional outcome.   REHAB POTENTIAL: Good  CLINICAL DECISION MAKING: Evolving/moderate complexity  EVALUATION COMPLEXITY: Moderate   GOALS: Goals reviewed with patient? Yes  SHORT TERM GOALS: Target date: 07/20/2022  Independent with initial HEP Goal status: INITIAL   LONG TERM GOALS: Target date: 08/10/2022  Independent with final HEP Goal status: INITIAL  2.  FOTO score improved to 54 Goal status: INITIAL  3.  Bil hip strength improved to 4+/5 in limited groups for improved strength and mobility Goal status: INIITAL  4.  Report pain < 3/10 with walking and exercise activities for improved function Goal status: INITIAL  5.  Report centralization of symptoms for improved function Goal status: INITIAL   PLAN:  PT FREQUENCY: 1x/week  PT DURATION: 6 weeks  PLANNED INTERVENTIONS: Therapeutic exercises, Therapeutic activity, Neuromuscular re-education, Gait training, Patient/Family education, Self Care, Joint mobilization, Aquatic Therapy, Dry Needling, Electrical stimulation, Spinal manipulation, Spinal mobilization, Cryotherapy, Moist heat, Taping, Traction, Ionotophoresis '4mg'$ /ml Dexamethasone, Manual therapy, and Re-evaluation.  PLAN FOR NEXT SESSION: DN to Rt QL, piriformis and multifidi; review/update HEP PRN  Elsie Ra, PT, DPT 07/24/22 9:32 AM

## 2022-07-30 ENCOUNTER — Encounter: Payer: Self-pay | Admitting: Physical Therapy

## 2022-07-30 ENCOUNTER — Ambulatory Visit (INDEPENDENT_AMBULATORY_CARE_PROVIDER_SITE_OTHER): Payer: 59 | Admitting: Physical Therapy

## 2022-07-30 DIAGNOSIS — R29898 Other symptoms and signs involving the musculoskeletal system: Secondary | ICD-10-CM

## 2022-07-30 DIAGNOSIS — M6281 Muscle weakness (generalized): Secondary | ICD-10-CM

## 2022-07-30 DIAGNOSIS — M5459 Other low back pain: Secondary | ICD-10-CM | POA: Diagnosis not present

## 2022-07-30 NOTE — Therapy (Signed)
OUTPATIENT PHYSICAL THERAPY TREATMENT   Patient Name: Jeffery [name] MRN: [id number] DOB:November 02, 1960, 62 y.o., male Today's Date: 07/30/2022  END OF SESSION:  PT End of Session - 07/30/22 1339     Visit Number 5    Number of Visits 6    Date for PT Re-Evaluation 08/10/22    Authorization Type [employer]et[employer]    Authorization - Number of Visits 35    PT Start Time 3785    PT Stop Time 8850    PT Time Calculation (min) 44 min    Activity Tolerance Patient tolerated treatment well    Behavior During Therapy [facility] for tasks assessed/performed                Past Medical History:  Diagnosis Date   Anal fissure    Diabetes mellitus without complication ([place])    Fatty liver    Hyperplastic rectal polyp    Hypertension    Lipoma    Past Surgical History:  Procedure Laterality Date   CATARACT EXTRACTION Left    CERVICAL [place] ARTHROPLASTY  07/23/2005   C-5 and C-6   SHOULDER SURGERY  2008 and 2012   left in 2008, right had rtc tear 2012   Patient Active Problem List   Diagnosis Date Noted   Elevated alkaline phosphatase level 11/09/2021   Morbid obesity ([place]) 06/27/2019   BMI 36.0-36.9,adult 06/27/2019   Bergmeister papilla 05/12/2019   Nuclear sclerotic cataract of both eyes 05/12/2019   Diabetes mellitus ([place]) 09/11/2018   Hypertension 09/11/2018   Hyperthyroidism 09/11/2018   Lipoma of abdominal wall 02/12/2012    PCP: Okey Dupre, MD  REFERRING PROVIDER: Lorine Bears, NP  REFERRING DIAG: [phone] (ICD-10-CM) - Chronic bilateral low back pain without sciatica M47.816 (ICD-10-CM) - Spondylosis without myelopathy or radiculopathy, lumbar region M47.816 (ICD-10-CM) - Facet hypertrophy of lumbar region  Rationale for Evaluation and Treatment: Rehabilitation  THERAPY DIAG:  Other low back pain  Muscle weakness (generalized)  Other symptoms and signs involving the musculoskeletal system  ONSET DATE: chronic (since 2003) with acute exacerbation in  July 2023  SUBJECTIVE:                                                                                                                                                                                           SUBJECTIVE STATEMENT: Overall pain feels much better.  Doesn't think he needs DN today.  PERTINENT HISTORY:  C5-6 cervical disc arthroplasty, bil shoulder surgeries, DM, HTN  PAIN:  Are you having pain? Yes: NPRS scale: 4 currently, up to 7 /10 Pain location: low back, radiates to hips Pain description: sharp, "  like I'm being stuck with an electric prod" Aggravating factors: sudden movements Relieving factors: medication  PRECAUTIONS: None  WEIGHT BEARING RESTRICTIONS: No  FALLS:  Has patient fallen in last 6 months? 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 No acute abnormality of the lumbar spine. 2. Unchanged small right subarticular disc protrusion at L3-L4 narrowing the right lateral recess. 3. Unchanged small left subarticular disc protrusion at L4-L5 narrowing the left lateral recess. Mild bilateral L4 neural foraminal stenosis. 4. Unchanged left subarticular disc protrusion at L5-S1 without associated stenosis.  PATIENT SURVEYS:  06/29/22: FOTO 39 (predicted 54) 07/30/22: FOTO 52  SCREENING FOR RED FLAGS: Bowel or bladder incontinence: Yes: improved this week Spinal tumors: No Cauda equina syndrome: No Compression fracture: No Abdominal aneurysm: No  COGNITION: Overall cognitive status: Within functional limits for tasks assessed     SENSATION: WFL  MUSCLE LENGTH: Hamstrings: WNL bil  POSTURE: rounded  shoulders and forward head  PALPATION: 06/29/22: trigger points in Rt QL and piriformis; hypomobility L4/5  LUMBAR ROM:   AROM eval  Flexion WNL  Extension WNL with Rt side pain  Right lateral flexion WNL  Left lateral flexion WNL  Right Quadrant WNL with discomfort  Left Quadrant Rt side sharp pain   (Blank rows = not tested)   LOWER EXTREMITY MMT:    MMT Right eval Left eval  Hip flexion 5/5 5/5  Hip extension 3-/5 (pain) 3/5  Hip abduction 3/5 3/5  Knee flexion 5/5 5/5  Knee extension 5/5 5/5   (Blank rows = not tested)  LUMBAR SPECIAL TESTS:  Slump test: Negative  GAIT: Comments: No significant abnormalities noted today  TODAY'S TREATMENT:                                                                                                                              DATE:  07/30/22 Therex: Piriformis stretch bilat for 30 sec X 2 knee to opposite shoulder Beginner bridges X 10 Lower trunk rotation 10 x 5 sec hold  Prone hip extension 5 sec hold x 10 bil; alternating Prone alt hip/arm 5 sec hold x 10 bil; alternating Standing rows blue X 20 with TAC; 5 sec hold Standing extensions blue X 20 with TAC Standing hip extensions X 10 bilat Standing hip abductions X 10 bilat  Manual IASTM with hypervolt to Rt QL, lumbar paraspinals and glute med (in sitting) x 5 min   07/24/2022:   - Manual therapy for skilled palpation and active compression with Trigger Point Dry-Needling  Treatment instructions: Expect mild to moderate muscle soreness. S/S of pneumothorax if dry needled over a lung field,  and to seek immediate medical attention should they occur. Patient verbalized understanding of these instructions and education.  Patient Consent Given: Yes Education handout provided: No Muscles treated: R glutes and  R piriformis  Electrical stimulation performed: No Parameters:  Treatment response/outcome: good overall tolerance noted  -Moist heat to low back and Rt glutes  with leg elevated after DN to warm up muscles before stretching (7 total minutes and not included in treatment time)  Therex:  -Piriformis stretch bilat for 30 sec X 2 figure 4 and X 2 knee to opposite shoulder - LTR 10 sec X 5 bilat - beginner bridges X 10 -Supine clams blue 2X10 -Standing lumbar extension X 10 holding 3 sec -Standing hip extensions X 10 bilat -Standing hip abductions X 10 bilat -Standing rows blue X 20 with TAC -Standing extensions blue X 20 with TAC   TODAY'S TREATMENT:                                                                                                                              DATE:  07/11/2022:    - Manual therapy for skilled palpation and active compression with Trigger Point Dry-Needling  Treatment instructions: Expect mild to moderate muscle soreness. S/S of pneumothorax if dry needled over a lung field, and to seek immediate medical attention should they occur. Patient verbalized understanding of these instructions and education.  Patient Consent Given: Yes Education handout provided: No Muscles treated: R QL, multifidi, and R piriformis  Electrical stimulation performed: No Parameters:  Treatment response/outcome: good overall tolerance noted  -Moist heat to low back and Rt glutes with leg elevated after DN to warm up muscles before stretching (7 total minutes and not included in treatment time) -Piriformis stretch on Rt 30 sec X 2 figure 4 and X 2 knee to opposite shoulder - LTR 10 sec X 5 bilat - bridges X 10 -Supine clams blue 2X10 -Standing hip extensions X 10 bilat -Standing hip abductions X 10 bilat   PATIENT EDUCATION:  Education details: HEP Person educated: Patient Education method: Explanation, Demonstration, and Handouts Education comprehension: verbalized understanding, returned demonstration, and needs further education  HOME EXERCISE PROGRAM: Access Code: [email or web address] URL: [email or web address] Date:  07/30/2022 Prepared by: Faustino Congress  Exercises - Supine Piriformis Stretch with Foot on Ground  - 1-2 x daily - 7 x weekly - 1 sets - 3 reps - 30 sec hold - Sidelying Hip Circles  - 1-2 x daily - 7 x weekly - 2-3 sets - 10 reps - Supine Bridge  - 1-2 x daily - 7 x weekly - 2-3 sets - 10 reps - 5 sec hold - Supine Lower Trunk Rotation  - 2 x daily - 7 x weekly - 3 sets - 10 reps - 2-5 hold - Supine Posterior Pelvic Tilt  - 2 x daily - 7 x weekly - 3 sets - 10 reps - 2-5 hold - Seated Hamstring Stretch  - 2  x daily - 7 x weekly - 3 sets - 10 reps - 2-5 hold - Prone Hip Extension  - 1 x daily - 7 x weekly - 1-2 sets - 10 reps - 5 sec hold - Prone Alternating Arm and Leg Lifts  - 1 x daily - 7 x weekly - 1-2 sets - 10 reps - 5 sec hold - Standing Row with Anchored Resistance  - 1 x daily - 7 x weekly - 2 sets - 10 reps - 5 sec hold - Shoulder extension with resistance - Neutral  - 1 x daily - 7 x weekly - 2 sets - 10 reps - 5 sec hold  Patient Education - Trigger Point Dry Needling  ASSESSMENT:  CLINICAL IMPRESSION: Pt nearly meeting FOTO score today and overall progressing well with PT.  STG #1 has been met with HEP updated today.  Will continue to benefit from PT, and anticipate d/c next 1-3 visits.  OBJECTIVE IMPAIRMENTS: decreased strength, hypomobility, increased fascial restrictions, increased muscle spasms, postural dysfunction, and pain.   ACTIVITY LIMITATIONS: carrying, lifting, bending, standing, squatting, and locomotion level  PARTICIPATION LIMITATIONS: meal prep, cleaning, laundry, community activity, occupation, and yard work  PERSONAL FACTORS: 3+ comorbidities: C5-6 cervical disc arthroplasty, bil shoulder surgeries, DM, HTN  are also affecting patient's functional outcome.   REHAB POTENTIAL: Good  CLINICAL DECISION MAKING: Evolving/moderate complexity  EVALUATION COMPLEXITY: Moderate   GOALS: Goals reviewed with patient? Yes  SHORT TERM GOALS: Target date:  07/20/2022  Independent with initial HEP Goal status: MET 07/30/22   LONG TERM GOALS: Target date: 08/10/2022  Independent with final HEP Goal status: INITIAL  2.  FOTO score improved to 54 Goal status: INITIAL  3.  Bil hip strength improved to 4+/5 in limited groups for improved strength and mobility Goal status: INIITAL  4.  Report pain < 3/10 with walking and exercise activities for improved function Goal status: INITIAL  5.  Report centralization of symptoms for improved function Goal status: INITIAL   PLAN:  PT FREQUENCY: 1x/week  PT DURATION: 6 weeks  PLANNED INTERVENTIONS: Therapeutic exercises, Therapeutic activity, Neuromuscular re-education, Gait training, Patient/Family education, Self Care, Joint mobilization, Aquatic Therapy, Dry Needling, Electrical stimulation, Spinal manipulation, Spinal mobilization, Cryotherapy, Moist heat, Taping, Traction, Ionotophoresis '4mg'$ /ml Dexamethasone, Manual therapy, and Re-evaluation.  PLAN FOR NEXT SESSION: review updated HEP,  DN to Rt QL, piriformis and multifidi; review/update HEP PRN   Laureen Abrahams, PT, DPT 07/30/22 2:25 PM

## 2022-08-03 ENCOUNTER — Other Ambulatory Visit: Payer: Self-pay | Admitting: Surgical

## 2022-08-06 ENCOUNTER — Ambulatory Visit (INDEPENDENT_AMBULATORY_CARE_PROVIDER_SITE_OTHER): Payer: 59 | Admitting: Physical Therapy

## 2022-08-06 ENCOUNTER — Encounter: Payer: Self-pay | Admitting: Physical Therapy

## 2022-08-06 DIAGNOSIS — M6281 Muscle weakness (generalized): Secondary | ICD-10-CM | POA: Diagnosis not present

## 2022-08-06 DIAGNOSIS — R29898 Other symptoms and signs involving the musculoskeletal system: Secondary | ICD-10-CM | POA: Diagnosis not present

## 2022-08-06 DIAGNOSIS — M5459 Other low back pain: Secondary | ICD-10-CM

## 2022-08-06 NOTE — Therapy (Signed)
OUTPATIENT PHYSICAL THERAPY TREATMENT DISCHARGE SUMMARY   Patient Name: Jeffery [name] MRN: [id number] DOB:1961/05/25, 62 y.o., male Today's Date: 08/06/2022  END OF SESSION:  PT End of Session - 08/06/22 0931     Visit Number 6    Number of Visits 6    Date for PT Re-Evaluation 08/10/22    Authorization Type [employer]    Authorization - Number of Visits 35    PT Start Time 0930    PT Stop Time 0955    PT Time Calculation (min) 25 min    Activity Tolerance Patient tolerated treatment well    Behavior During Therapy [facility] for tasks assessed/performed                 Past Medical History:  Diagnosis Date   Anal fissure    Diabetes mellitus without complication ([place])    Fatty liver    Hyperplastic rectal polyp    Hypertension    Lipoma    Past Surgical History:  Procedure Laterality Date   CATARACT EXTRACTION Left    CERVICAL DISC ARTHROPLASTY  07/23/2005   C-5 and C-6   SHOULDER SURGERY  2008 and 2012   left in 2008, right had rtc tear 2012   Patient Active Problem List   Diagnosis Date Noted   Elevated alkaline phosphatase level 11/09/2021   Morbid obesity ([place]) 06/27/2019   BMI 36.0-36.9,adult 06/27/2019   Bergmeister papilla 05/12/2019   Nuclear sclerotic cataract of both eyes 05/12/2019   Diabetes mellitus ([place]) 09/11/2018   Hypertension 09/11/2018   Hyperthyroidism 09/11/2018   Lipoma of abdominal wall 02/12/2012    PCP: Okey Dupre, MD  REFERRING PROVIDER: Lorine Bears, NP  REFERRING DIAG: [phone] (ICD-10-CM) - Chronic bilateral low back pain without sciatica M47.816 (ICD-10-CM) - Spondylosis without myelopathy or radiculopathy, lumbar region M47.816 (ICD-10-CM) - Facet hypertrophy of lumbar region  Rationale for Evaluation and Treatment: Rehabilitation  THERAPY DIAG:  Other low back pain  Muscle weakness (generalized)  Other symptoms and signs involving the musculoskeletal system  ONSET DATE: chronic (since 2003) with  acute exacerbation in July 2023  SUBJECTIVE:                                                                                                                                                                                           SUBJECTIVE STATEMENT: Doing well, no pain this morning.  Up to 4-5/10 but it's very intermittent; symptoms centralized  PERTINENT HISTORY:  C5-6 cervical disc arthroplasty, bil shoulder surgeries, DM, HTN  PAIN:  Are you having pain? Yes: NPRS scale: 4 currently, up to 7 /10 Pain location: low  back, radiates to hips Pain description: sharp, "like I'm being stuck with an electric prod" Aggravating factors: sudden movements Relieving factors: medication  PRECAUTIONS: None  WEIGHT BEARING RESTRICTIONS: No  FALLS:  Has patient fallen in last 6 months? 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 No acute abnormality of the lumbar spine. 2. Unchanged small right subarticular disc protrusion at L3-L4 narrowing the right lateral recess. 3. Unchanged small left subarticular disc protrusion at L4-L5 narrowing the left lateral recess. Mild bilateral L4 neural foraminal stenosis. 4. Unchanged left subarticular disc protrusion at L5-S1 without associated stenosis.  PATIENT SURVEYS:  06/29/22: FOTO 39 (predicted 54) 07/30/22: FOTO 52 08/06/22: FOTO 63  SCREENING FOR RED FLAGS: Bowel or bladder incontinence: Yes: improved this week Spinal tumors: No Cauda equina syndrome: No Compression fracture: No Abdominal aneurysm: No  COGNITION: Overall cognitive status: Within functional limits for tasks  assessed     SENSATION: WFL  MUSCLE LENGTH: Hamstrings: WNL bil  POSTURE: rounded shoulders and forward head  PALPATION: 06/29/22: trigger points in Rt QL and piriformis; hypomobility L4/5  LUMBAR ROM:   AROM eval  Flexion WNL  Extension WNL with Rt side pain  Right lateral flexion WNL  Left lateral flexion WNL  Right Quadrant WNL with discomfort  Left Quadrant Rt side sharp pain   (Blank rows = not tested)   LOWER EXTREMITY MMT:    MMT Right eval Left eval Rt/Lt 08/06/22  Hip flexion 5/5 5/5   Hip extension 3-/5 (pain) 3/5 4/4  Hip abduction 3/5 3/5 3+/5  Knee flexion 5/5 5/5   Knee extension 5/5 5/5    (Blank rows = not tested)  LUMBAR SPECIAL TESTS:  Slump test: Negative  GAIT: Comments: No significant abnormalities noted today  TODAY'S TREATMENT:                                                                                                                              DATE:  08/06/22 Therex: NuStep L6 x 8 min Discussed HEP and activity progressions as well as community fitness progressions MMT - see above Provided SLS exercise and demonstrated for home for glute med activation   07/30/22 Therex: Piriformis stretch bilat for 30 sec X 2 knee to opposite shoulder Beginner bridges X 10 Lower trunk rotation 10 x 5 sec hold  Prone hip extension 5 sec hold x 10 bil; alternating Prone alt hip/arm 5 sec hold x 10 bil; alternating Standing rows blue X 20 with TAC; 5 sec hold Standing extensions blue X 20 with TAC Standing hip extensions X 10 bilat Standing hip abductions X 10 bilat  Manual IASTM  with hypervolt to Rt QL, lumbar paraspinals and glute med (in sitting) x 5 min   07/24/2022:   - Manual therapy for skilled palpation and active compression with Trigger Point Dry-Needling  Treatment instructions: Expect mild to moderate muscle soreness. S/S of pneumothorax if dry needled over a lung field, and to seek immediate medical attention should they occur.  Patient verbalized understanding of these instructions and education.  Patient Consent Given: Yes Education handout provided: No Muscles treated: R glutes and  R piriformis  Electrical stimulation performed: No Parameters:  Treatment response/outcome: good overall tolerance noted  -Moist heat to low back and Rt glutes with leg elevated after DN to warm up muscles before stretching (7 total minutes and not included in treatment time)  Therex:  -Piriformis stretch bilat for 30 sec X 2 figure 4 and X 2 knee to opposite shoulder - LTR 10 sec X 5 bilat - beginner bridges X 10 -Supine clams blue 2X10 -Standing lumbar extension X 10 holding 3 sec -Standing hip extensions X 10 bilat -Standing hip abductions X 10 bilat -Standing rows blue X 20 with TAC -Standing extensions blue X 20 with TAC   TODAY'S TREATMENT:                                                                                                                              DATE:  07/11/2022:    - Manual therapy for skilled palpation and active compression with Trigger Point Dry-Needling  Treatment instructions: Expect mild to moderate muscle soreness. S/S of pneumothorax if dry needled over a lung field, and to seek immediate medical attention should they occur. Patient verbalized understanding of these instructions and education.  Patient Consent Given: Yes Education handout provided: No Muscles treated: R QL, multifidi, and R piriformis  Electrical stimulation performed: No Parameters:  Treatment response/outcome: good overall tolerance noted  -Moist heat to low back and Rt glutes with leg elevated after DN to warm up muscles before stretching (7 total minutes and not included in treatment time) -Piriformis stretch on Rt 30 sec X 2 figure 4 and X 2 knee to opposite shoulder - LTR 10 sec X 5 bilat - bridges X 10 -Supine clams blue 2X10 -Standing hip extensions X 10 bilat -Standing hip abductions X 10 bilat   PATIENT  EDUCATION:  Education details: HEP Person educated: Patient Education method: Explanation, Demonstration, and Handouts Education comprehension: verbalized understanding, returned demonstration, and needs further education  HOME EXERCISE PROGRAM: Access Code: [email or web address] URL: [email or web address] Date: 07/30/2022 Prepared by: Faustino Congress  Exercises - Supine Piriformis Stretch with Foot on Ground  - 1-2 x daily - 7 x weekly - 1 sets - 3 reps - 30 sec hold - Sidelying Hip Circles  - 1-2 x daily - 7 x weekly - 2-3 sets - 10 reps - Supine Bridge  - 1-2 x daily - 7 x weekly - 2-3 sets - 10 reps - 5 sec hold -  Supine Lower Trunk Rotation  - 2 x daily - 7 x weekly - 3 sets - 10 reps - 2-5 hold - Supine Posterior Pelvic Tilt  - 2 x daily - 7 x weekly - 3 sets - 10 reps - 2-5 hold - Seated Hamstring Stretch  - 2 x daily - 7 x weekly - 3 sets - 10 reps - 2-5 hold - Prone Hip Extension  - 1 x daily - 7 x weekly - 1-2 sets - 10 reps - 5 sec hold - Prone Alternating Arm and Leg Lifts  - 1 x daily - 7 x weekly - 1-2 sets - 10 reps - 5 sec hold - Standing Row with Anchored Resistance  - 1 x daily - 7 x weekly - 2 sets - 10 reps - 5 sec hold - Shoulder extension with resistance - Neutral  - 1 x daily - 7 x weekly - 2 sets - 10 reps - 5 sec hold - Single Leg Stance  - 1 x daily - 7 x weekly - 1 sets - 5 reps - 10-15 sec hold  Patient Education - Trigger Point Dry Needling  ASSESSMENT:  CLINICAL IMPRESSION: Pt has met all LTGs except strength goal, which he has demonstrated progress towards.  He has HEP to address weakness and will continue with community fitness as well.  Pt ready for d/c from PT.  OBJECTIVE IMPAIRMENTS: decreased strength, hypomobility, increased fascial restrictions, increased muscle spasms, postural dysfunction, and pain.   ACTIVITY LIMITATIONS: carrying, lifting, bending, standing, squatting, and locomotion level  PARTICIPATION LIMITATIONS: meal prep,  cleaning, laundry, community activity, occupation, and yard work  PERSONAL FACTORS: 3+ comorbidities: C5-6 cervical disc arthroplasty, bil shoulder surgeries, DM, HTN  are also affecting patient's functional outcome.   REHAB POTENTIAL: Good  CLINICAL DECISION MAKING: Evolving/moderate complexity  EVALUATION COMPLEXITY: Moderate   GOALS: Goals reviewed with patient? Yes  SHORT TERM GOALS: Target date: 07/20/2022  Independent with initial HEP Goal status: MET 07/30/22   LONG TERM GOALS: Target date: 08/10/2022  Independent with final HEP Goal status: MET 08/06/22  2.  FOTO score improved to 54 Goal status: MET 08/06/22  3.  Bil hip strength improved to 4+/5 in limited groups for improved strength and mobility Goal status: Improved, NOT MET 08/06/22  4.  Report pain < 3/10 with walking and exercise activities for improved function Goal status: MET 08/06/22  5.  Report centralization of symptoms for improved function Goal status: MET 08/06/22   PLAN:  PT FREQUENCY: 1x/week  PT DURATION: 6 weeks  PLANNED INTERVENTIONS: Therapeutic exercises, Therapeutic activity, Neuromuscular re-education, Gait training, Patient/Family education, Self Care, Joint mobilization, Aquatic Therapy, Dry Needling, Electrical stimulation, Spinal manipulation, Spinal mobilization, Cryotherapy, Moist heat, Taping, Traction, Ionotophoresis '4mg'$ /ml Dexamethasone, Manual therapy, and Re-evaluation.  PLAN FOR NEXT SESSION: d/c PT today  Laureen Abrahams, PT, DPT 08/06/22 10:06 AM    PHYSICAL THERAPY DISCHARGE SUMMARY  Visits from Start of Care: 6  Current functional level related to goals / functional outcomes: See above   Remaining deficits: See above   Education / Equipment: HEP, DN   Patient agrees to discharge. Patient goals were partially met. Patient is being discharged due to being pleased with the current functional level.   Laureen Abrahams, PT, DPT 08/06/22 10:06  AM  [facility] [address] [place], [place], [address] Phone: [phone]   Fax:  [phone]

## 2022-08-13 ENCOUNTER — Other Ambulatory Visit: Payer: Self-pay | Admitting: Orthopedic Surgery

## 2022-08-27 ENCOUNTER — Other Ambulatory Visit: Payer: Self-pay | Admitting: Surgical

## 2022-08-27 MED ORDER — METHOCARBAMOL 500 MG PO TABS: 500.0000 mg | ORAL_TABLET | Freq: Three times a day (TID) | ORAL | 0 refills | Status: DC | PRN

## 2022-08-27 NOTE — Telephone Encounter (Signed)
From: Janus Molder To: Office of [place], [place] Sent: 08/26/2022 8:58 PM EST Subject: Medication Renewal Request  Refills have been requested for the following medications:   methocarbamol (ROBAXIN) 500 MG tablet [Luke Darel Ricketts]  [facility]erred pharmacy: [id number] [place] [employer]

## 2022-09-05 DIAGNOSIS — M9905 Segmental and somatic dysfunction of pelvic region: Secondary | ICD-10-CM | POA: Diagnosis not present

## 2022-09-05 DIAGNOSIS — M9903 Segmental and somatic dysfunction of lumbar region: Secondary | ICD-10-CM | POA: Diagnosis not present

## 2022-09-05 DIAGNOSIS — M9904 Segmental and somatic dysfunction of sacral region: Secondary | ICD-10-CM | POA: Diagnosis not present

## 2022-09-05 DIAGNOSIS — M545 Low back pain, unspecified: Secondary | ICD-10-CM | POA: Diagnosis not present

## 2022-10-19 ENCOUNTER — Other Ambulatory Visit: Payer: Self-pay | Admitting: Orthopedic Surgery

## 2022-10-22 MED ORDER — MELOXICAM 15 MG PO TABS
15.0000 mg | ORAL_TABLET | Freq: Every day | ORAL | 0 refills | Status: DC
Start: 2022-10-22 — End: 2022-11-04

## 2022-10-23 DIAGNOSIS — Z961 Presence of intraocular lens: Secondary | ICD-10-CM | POA: Insufficient documentation

## 2022-10-23 DIAGNOSIS — M9901 Segmental and somatic dysfunction of cervical region: Secondary | ICD-10-CM | POA: Diagnosis not present

## 2022-10-23 DIAGNOSIS — M9902 Segmental and somatic dysfunction of thoracic region: Secondary | ICD-10-CM | POA: Diagnosis not present

## 2022-10-23 DIAGNOSIS — M9903 Segmental and somatic dysfunction of lumbar region: Secondary | ICD-10-CM | POA: Diagnosis not present

## 2022-10-23 DIAGNOSIS — M5386 Other specified dorsopathies, lumbar region: Secondary | ICD-10-CM | POA: Diagnosis not present

## 2022-10-23 DIAGNOSIS — H2511 Age-related nuclear cataract, right eye: Secondary | ICD-10-CM | POA: Insufficient documentation

## 2022-10-23 DIAGNOSIS — M531 Cervicobrachial syndrome: Secondary | ICD-10-CM | POA: Diagnosis not present

## 2022-10-24 DIAGNOSIS — Z7984 Long term (current) use of oral hypoglycemic drugs: Secondary | ICD-10-CM | POA: Diagnosis not present

## 2022-10-24 DIAGNOSIS — E1136 Type 2 diabetes mellitus with diabetic cataract: Secondary | ICD-10-CM | POA: Diagnosis not present

## 2022-10-24 DIAGNOSIS — M9902 Segmental and somatic dysfunction of thoracic region: Secondary | ICD-10-CM | POA: Diagnosis not present

## 2022-10-24 DIAGNOSIS — M9901 Segmental and somatic dysfunction of cervical region: Secondary | ICD-10-CM | POA: Diagnosis not present

## 2022-10-24 DIAGNOSIS — Z961 Presence of intraocular lens: Secondary | ICD-10-CM | POA: Diagnosis not present

## 2022-10-24 DIAGNOSIS — M9903 Segmental and somatic dysfunction of lumbar region: Secondary | ICD-10-CM | POA: Diagnosis not present

## 2022-10-24 DIAGNOSIS — H2511 Age-related nuclear cataract, right eye: Secondary | ICD-10-CM | POA: Diagnosis not present

## 2022-10-24 DIAGNOSIS — M531 Cervicobrachial syndrome: Secondary | ICD-10-CM | POA: Diagnosis not present

## 2022-10-24 DIAGNOSIS — H26492 Other secondary cataract, left eye: Secondary | ICD-10-CM | POA: Diagnosis not present

## 2022-10-24 DIAGNOSIS — I10 Essential (primary) hypertension: Secondary | ICD-10-CM | POA: Diagnosis not present

## 2022-10-24 DIAGNOSIS — M5386 Other specified dorsopathies, lumbar region: Secondary | ICD-10-CM | POA: Diagnosis not present

## 2022-10-26 ENCOUNTER — Other Ambulatory Visit: Payer: Self-pay | Admitting: Surgical

## 2022-10-26 MED ORDER — METHOCARBAMOL 500 MG PO TABS
500.0000 mg | ORAL_TABLET | Freq: Three times a day (TID) | ORAL | 0 refills | Status: DC | PRN
Start: 2022-10-26 — End: 2022-12-20

## 2022-10-26 NOTE — Telephone Encounter (Signed)
From: Hans Eden To: Office of [place], [place] Sent: 10/25/2022 6:40 PM EDT Subject: Medication Renewal Request  Refills have been requested for the following medications:   methocarbamol (ROBAXIN) 500 MG tablet [Luke Ramil [place]]  Preferred pharmacy: [id number] - [place], [place] - [address] Delivery method: [employer]

## 2022-10-30 DIAGNOSIS — M9903 Segmental and somatic dysfunction of lumbar region: Secondary | ICD-10-CM | POA: Diagnosis not present

## 2022-10-30 DIAGNOSIS — M9902 Segmental and somatic dysfunction of thoracic region: Secondary | ICD-10-CM | POA: Diagnosis not present

## 2022-10-30 DIAGNOSIS — M531 Cervicobrachial syndrome: Secondary | ICD-10-CM | POA: Diagnosis not present

## 2022-10-30 DIAGNOSIS — M9901 Segmental and somatic dysfunction of cervical region: Secondary | ICD-10-CM | POA: Diagnosis not present

## 2022-10-30 DIAGNOSIS — R600 Localized edema: Secondary | ICD-10-CM | POA: Diagnosis not present

## 2022-10-30 DIAGNOSIS — M5386 Other specified dorsopathies, lumbar region: Secondary | ICD-10-CM | POA: Diagnosis not present

## 2022-10-31 DIAGNOSIS — M5386 Other specified dorsopathies, lumbar region: Secondary | ICD-10-CM | POA: Diagnosis not present

## 2022-10-31 DIAGNOSIS — M9902 Segmental and somatic dysfunction of thoracic region: Secondary | ICD-10-CM | POA: Diagnosis not present

## 2022-10-31 DIAGNOSIS — M9901 Segmental and somatic dysfunction of cervical region: Secondary | ICD-10-CM | POA: Diagnosis not present

## 2022-10-31 DIAGNOSIS — M531 Cervicobrachial syndrome: Secondary | ICD-10-CM | POA: Diagnosis not present

## 2022-10-31 DIAGNOSIS — M9903 Segmental and somatic dysfunction of lumbar region: Secondary | ICD-10-CM | POA: Diagnosis not present

## 2022-11-04 ENCOUNTER — Other Ambulatory Visit: Payer: Self-pay | Admitting: Surgical

## 2022-11-06 DIAGNOSIS — M9903 Segmental and somatic dysfunction of lumbar region: Secondary | ICD-10-CM | POA: Diagnosis not present

## 2022-11-06 DIAGNOSIS — M9901 Segmental and somatic dysfunction of cervical region: Secondary | ICD-10-CM | POA: Diagnosis not present

## 2022-11-06 DIAGNOSIS — M9902 Segmental and somatic dysfunction of thoracic region: Secondary | ICD-10-CM | POA: Diagnosis not present

## 2022-11-06 DIAGNOSIS — M5386 Other specified dorsopathies, lumbar region: Secondary | ICD-10-CM | POA: Diagnosis not present

## 2022-11-06 DIAGNOSIS — M531 Cervicobrachial syndrome: Secondary | ICD-10-CM | POA: Diagnosis not present

## 2022-11-07 DIAGNOSIS — M9902 Segmental and somatic dysfunction of thoracic region: Secondary | ICD-10-CM | POA: Diagnosis not present

## 2022-11-07 DIAGNOSIS — M531 Cervicobrachial syndrome: Secondary | ICD-10-CM | POA: Diagnosis not present

## 2022-11-07 DIAGNOSIS — M9901 Segmental and somatic dysfunction of cervical region: Secondary | ICD-10-CM | POA: Diagnosis not present

## 2022-11-07 DIAGNOSIS — M5386 Other specified dorsopathies, lumbar region: Secondary | ICD-10-CM | POA: Diagnosis not present

## 2022-11-07 DIAGNOSIS — M9903 Segmental and somatic dysfunction of lumbar region: Secondary | ICD-10-CM | POA: Diagnosis not present

## 2022-11-12 DIAGNOSIS — J301 Allergic rhinitis due to pollen: Secondary | ICD-10-CM | POA: Diagnosis not present

## 2022-11-12 DIAGNOSIS — Z91010 Allergy to peanuts: Secondary | ICD-10-CM | POA: Diagnosis not present

## 2022-11-12 DIAGNOSIS — L299 Pruritus, unspecified: Secondary | ICD-10-CM | POA: Diagnosis not present

## 2022-11-12 DIAGNOSIS — J3089 Other allergic rhinitis: Secondary | ICD-10-CM | POA: Diagnosis not present

## 2022-11-13 DIAGNOSIS — M9901 Segmental and somatic dysfunction of cervical region: Secondary | ICD-10-CM | POA: Diagnosis not present

## 2022-11-13 DIAGNOSIS — M531 Cervicobrachial syndrome: Secondary | ICD-10-CM | POA: Diagnosis not present

## 2022-11-13 DIAGNOSIS — M9903 Segmental and somatic dysfunction of lumbar region: Secondary | ICD-10-CM | POA: Diagnosis not present

## 2022-11-13 DIAGNOSIS — M5386 Other specified dorsopathies, lumbar region: Secondary | ICD-10-CM | POA: Diagnosis not present

## 2022-11-13 DIAGNOSIS — M9902 Segmental and somatic dysfunction of thoracic region: Secondary | ICD-10-CM | POA: Diagnosis not present

## 2022-11-20 DIAGNOSIS — M9902 Segmental and somatic dysfunction of thoracic region: Secondary | ICD-10-CM | POA: Diagnosis not present

## 2022-11-20 DIAGNOSIS — M9903 Segmental and somatic dysfunction of lumbar region: Secondary | ICD-10-CM | POA: Diagnosis not present

## 2022-11-20 DIAGNOSIS — M531 Cervicobrachial syndrome: Secondary | ICD-10-CM | POA: Diagnosis not present

## 2022-11-20 DIAGNOSIS — M5386 Other specified dorsopathies, lumbar region: Secondary | ICD-10-CM | POA: Diagnosis not present

## 2022-11-20 DIAGNOSIS — M9901 Segmental and somatic dysfunction of cervical region: Secondary | ICD-10-CM | POA: Diagnosis not present

## 2022-11-21 DIAGNOSIS — M5386 Other specified dorsopathies, lumbar region: Secondary | ICD-10-CM | POA: Diagnosis not present

## 2022-11-21 DIAGNOSIS — M9902 Segmental and somatic dysfunction of thoracic region: Secondary | ICD-10-CM | POA: Diagnosis not present

## 2022-11-21 DIAGNOSIS — M531 Cervicobrachial syndrome: Secondary | ICD-10-CM | POA: Diagnosis not present

## 2022-11-21 DIAGNOSIS — M9901 Segmental and somatic dysfunction of cervical region: Secondary | ICD-10-CM | POA: Diagnosis not present

## 2022-11-21 DIAGNOSIS — M9903 Segmental and somatic dysfunction of lumbar region: Secondary | ICD-10-CM | POA: Diagnosis not present

## 2022-11-27 DIAGNOSIS — M9903 Segmental and somatic dysfunction of lumbar region: Secondary | ICD-10-CM | POA: Diagnosis not present

## 2022-11-27 DIAGNOSIS — M5386 Other specified dorsopathies, lumbar region: Secondary | ICD-10-CM | POA: Diagnosis not present

## 2022-11-27 DIAGNOSIS — M9901 Segmental and somatic dysfunction of cervical region: Secondary | ICD-10-CM | POA: Diagnosis not present

## 2022-11-27 DIAGNOSIS — M9902 Segmental and somatic dysfunction of thoracic region: Secondary | ICD-10-CM | POA: Diagnosis not present

## 2022-11-27 DIAGNOSIS — M531 Cervicobrachial syndrome: Secondary | ICD-10-CM | POA: Diagnosis not present

## 2022-11-28 DIAGNOSIS — M9902 Segmental and somatic dysfunction of thoracic region: Secondary | ICD-10-CM | POA: Diagnosis not present

## 2022-11-28 DIAGNOSIS — M5386 Other specified dorsopathies, lumbar region: Secondary | ICD-10-CM | POA: Diagnosis not present

## 2022-11-28 DIAGNOSIS — M531 Cervicobrachial syndrome: Secondary | ICD-10-CM | POA: Diagnosis not present

## 2022-11-28 DIAGNOSIS — M9903 Segmental and somatic dysfunction of lumbar region: Secondary | ICD-10-CM | POA: Diagnosis not present

## 2022-11-28 DIAGNOSIS — M9901 Segmental and somatic dysfunction of cervical region: Secondary | ICD-10-CM | POA: Diagnosis not present

## 2022-12-03 ENCOUNTER — Other Ambulatory Visit: Payer: Self-pay | Admitting: Surgical

## 2022-12-03 MED ORDER — MELOXICAM 15 MG PO TABS: 15.0000 mg | ORAL_TABLET | Freq: Every day | ORAL | 0 refills | Status: DC

## 2022-12-03 NOTE — Telephone Encounter (Signed)
From: Hans Eden To: Office of [place], [place] Sent: 11/30/2022 8:34 PM EDT Subject: Medication Renewal Request  Refills have been requested for the following medications:   meloxicam (MOBIC) 15 MG tablet [Luke Catrice Zuleta]  Preferred pharmacy: [id number] - [place], [place] - [address] Delivery method: [employer]

## 2022-12-08 DIAGNOSIS — I129 Hypertensive chronic kidney disease with stage 1 through stage 4 chronic kidney disease, or unspecified chronic kidney disease: Secondary | ICD-10-CM | POA: Diagnosis not present

## 2022-12-08 DIAGNOSIS — Z7984 Long term (current) use of oral hypoglycemic drugs: Secondary | ICD-10-CM | POA: Diagnosis not present

## 2022-12-08 DIAGNOSIS — E0590 Thyrotoxicosis, unspecified without thyrotoxic crisis or storm: Secondary | ICD-10-CM | POA: Diagnosis not present

## 2022-12-08 DIAGNOSIS — Z8249 Family history of ischemic heart disease and other diseases of the circulatory system: Secondary | ICD-10-CM | POA: Diagnosis not present

## 2022-12-08 DIAGNOSIS — E1122 Type 2 diabetes mellitus with diabetic chronic kidney disease: Secondary | ICD-10-CM | POA: Diagnosis not present

## 2022-12-08 DIAGNOSIS — M1990 Unspecified osteoarthritis, unspecified site: Secondary | ICD-10-CM | POA: Diagnosis not present

## 2022-12-08 DIAGNOSIS — Z6835 Body mass index (BMI) 35.0-35.9, adult: Secondary | ICD-10-CM | POA: Diagnosis not present

## 2022-12-08 DIAGNOSIS — N189 Chronic kidney disease, unspecified: Secondary | ICD-10-CM | POA: Diagnosis not present

## 2022-12-08 DIAGNOSIS — M5440 Lumbago with sciatica, unspecified side: Secondary | ICD-10-CM | POA: Diagnosis not present

## 2022-12-08 DIAGNOSIS — Z833 Family history of diabetes mellitus: Secondary | ICD-10-CM | POA: Diagnosis not present

## 2022-12-08 DIAGNOSIS — Z888 Allergy status to other drugs, medicaments and biological substances status: Secondary | ICD-10-CM | POA: Diagnosis not present

## 2022-12-10 DIAGNOSIS — M9901 Segmental and somatic dysfunction of cervical region: Secondary | ICD-10-CM | POA: Diagnosis not present

## 2022-12-10 DIAGNOSIS — M5386 Other specified dorsopathies, lumbar region: Secondary | ICD-10-CM | POA: Diagnosis not present

## 2022-12-10 DIAGNOSIS — M531 Cervicobrachial syndrome: Secondary | ICD-10-CM | POA: Diagnosis not present

## 2022-12-10 DIAGNOSIS — M9903 Segmental and somatic dysfunction of lumbar region: Secondary | ICD-10-CM | POA: Diagnosis not present

## 2022-12-10 DIAGNOSIS — M9902 Segmental and somatic dysfunction of thoracic region: Secondary | ICD-10-CM | POA: Diagnosis not present

## 2022-12-11 DIAGNOSIS — M5386 Other specified dorsopathies, lumbar region: Secondary | ICD-10-CM | POA: Diagnosis not present

## 2022-12-11 DIAGNOSIS — M9903 Segmental and somatic dysfunction of lumbar region: Secondary | ICD-10-CM | POA: Diagnosis not present

## 2022-12-11 DIAGNOSIS — M531 Cervicobrachial syndrome: Secondary | ICD-10-CM | POA: Diagnosis not present

## 2022-12-11 DIAGNOSIS — M9902 Segmental and somatic dysfunction of thoracic region: Secondary | ICD-10-CM | POA: Diagnosis not present

## 2022-12-11 DIAGNOSIS — M9901 Segmental and somatic dysfunction of cervical region: Secondary | ICD-10-CM | POA: Diagnosis not present

## 2022-12-14 ENCOUNTER — Ambulatory Visit: Payer: 59 | Admitting: Internal Medicine

## 2022-12-14 ENCOUNTER — Encounter: Payer: Self-pay | Admitting: Internal Medicine

## 2022-12-14 VITALS — BP 126/74 | HR 97 | Ht 74.0 in | Wt 258.0 lb

## 2022-12-14 DIAGNOSIS — E0590 Thyrotoxicosis, unspecified without thyrotoxic crisis or storm: Secondary | ICD-10-CM | POA: Diagnosis not present

## 2022-12-14 DIAGNOSIS — E0500 Thyrotoxicosis with diffuse goiter without thyrotoxic crisis or storm: Secondary | ICD-10-CM

## 2022-12-14 LAB — T4, FREE: Free T4: 3.26 ng/dL — ABNORMAL HIGH (ref 0.60–1.60)

## 2022-12-14 LAB — TSH: TSH: <0.01 u[IU]/mL — ABNORMAL LOW (ref 0.35–5.50)

## 2022-12-14 MED ORDER — METHIMAZOLE 10 MG PO TABS: 15.0000 mg | ORAL_TABLET | Freq: Every day | ORAL | 2 refills | Status: DC

## 2022-12-14 NOTE — Progress Notes (Signed)
Name: Jeffery [name]  MRN/ DOB: [id number], 1960-12-01    Age/ Sex: 62 y.o., male    PCP: Emilio Aspen, MD   Reason for Endocrinology Evaluation: Hyperthyroidism      Date of Initial Endocrinology Evaluation: 11/08/2021    HPI: [name] is a 62 y.o. male with a past medical history of T2DM, dyslipidemia and DJD. The patient presented for initial endocrinology clinic visit on 11/08/2021 for consultative assistance with his Hyperthyroidism .    He was diagnosed with hyperthyroidism in September 2019, he was noted to have abnormal thyroid hormone levels during routine testing, he was subsequently seen by endocrinologist at [facility] physicians and was started on methimazole at the time.  He eventually transitioned care to [facility] endocrinology with his last visit in 2021.   The patient has been off methimazole since 2021 until his return to his PCP in January 2023 when he was noted with palpitations and a slight tremor of her finger and that is when he was diagnosed with hyperthyroidism again and was restarted on methimazole   TRAb elevated at 10.94 IU/L 10/2021  Patient was lost to follow-up for approximately 13 months prior to his return to our office  Mother with Luiz Blare' disease   SUBJECTIVE:    Today (12/14/22): Jeffery [name] is here for follow-up on hyperthyroidism.  The patient has not been to our clinic in 13 months.    Pt has been noted with weight gain  He ran out Methimazole  three days ago  Denies local neck swelling  Denies constipation or diarrhea  Denies palpitations  Denies anxiety    Methimazole 10 mg , 1.5 tabs daily        HISTORY:  Past Medical History:  Past Medical History:  Diagnosis Date   Anal fissure    Diabetes mellitus without complication ([facility])    Fatty liver    Hyperplastic rectal polyp    Hypertension    Lipoma    Past Surgical History:  Past Surgical History:  Procedure Laterality Date   CATARACT  EXTRACTION Left    CERVICAL DISC ARTHROPLASTY  07/23/2005   C-5 and C-6   SHOULDER SURGERY  2008 and 2012   left in 2008, right had rtc tear 2012    Social History:  reports that he has never smoked. He has never used smokeless tobacco. He reports that he does not drink alcohol and does not use drugs. Family History: family history includes Diabetes in his father and mother.   HOME MEDICATIONS: Allergies as of 12/14/2022       Reactions   Other Hives   ONLY ORAL steroids. Hives on upper torso only.   Nsaids Hives        Medication List        Accurate as of Dec 14, 2022 12:02 PM. If you have any questions, ask your nurse or doctor.          STOP taking these medications    losartan-hydrochlorothiazide 100-25 MG tablet Commonly known as: HYZAAR Stopped by: Scarlette Shorts, MD   predniSONE 10 MG (21) Tbpk tablet Commonly known as: STERAPRED UNI-PAK 21 TAB Stopped by: Scarlette Shorts, MD   valsartan-hydrochlorothiazide 320-25 MG tablet Commonly known as: DIOVAN-HCT Stopped by: Scarlette Shorts, MD       TAKE these medications    acetaminophen-codeine 300-30 MG tablet Commonly known as: TYLENOL #3 Take 1 tablet by mouth every 8 (eight) hours as needed  for moderate pain or severe pain.   amLODipine 5 MG tablet Commonly known as: NORVASC Take 2.5 mg by mouth daily.   CO Q 10 PO Take 1 tablet by mouth daily.   diazepam 5 MG tablet Commonly known as: Valium Take 1 tablet 30 to 60 minutes prior to MRI scan.  Do not operate motor vehicle while taking this medication.   hydrochlorothiazide 25 MG tablet Commonly known as: HYDRODIURIL Take 25 mg by mouth daily.   indomethacin 25 MG capsule Commonly known as: INDOCIN take 1-2 capsules by mouth three times a day if needed for JOINT PAIN   losartan 100 MG tablet Commonly known as: COZAAR Take 100 mg by mouth daily.   meloxicam 15 MG tablet Commonly known as: MOBIC Take 1 tablet (15 mg total)  by mouth daily.   methimazole 10 MG tablet Commonly known as: TAPAZOLE Take 1.5 tablets (15 mg total) by mouth daily.   methocarbamol 500 MG tablet Commonly known as: ROBAXIN Take 1 tablet (500 mg total) by mouth every 8 (eight) hours as needed for muscle spasms.   Omega 3 1000 MG Caps 1 capsule   omega-3 acid ethyl esters 1 g capsule Commonly known as: LOVAZA Take 1 g by mouth daily.   rosuvastatin 10 MG tablet Commonly known as: CRESTOR Take 10 mg by mouth at bedtime.   traMADol 50 MG tablet Commonly known as: Ultram Take 1 tablet (50 mg total) by mouth every 12 (twelve) hours as needed.          REVIEW OF SYSTEMS: A comprehensive ROS was conducted with the patient and is negative except as per HPI    OBJECTIVE:  VS: BP 126/74 (BP Location: Left Arm, Patient Position: Sitting, Cuff Size: Large)   Pulse 97   Ht 6\' 2"  (1.88 m)   Wt 258 lb (117 kg)   SpO2 98%   BMI 33.13 kg/m    Wt Readings from Last 3 Encounters:  12/14/22 258 lb (117 kg)  11/18/21 250 lb (113.4 kg)  11/08/21 250 lb (113.4 kg)     EXAM: General: Pt appears well and is in NAD  Eyes: External eye exam normal without stare, lid lag or exophthalmos.  EOM intact.  PERRL.  Neck: General: Supple without adenopathy. Thyroid: Thyroid size normal.  No goiter or nodules appreciated.  Lungs: Clear with good BS bilat with no rales, rhonchi, or wheezes  Heart: Auscultation: RRR.  Abdomen: Soft, nontender  Extremities:  BL LE: Trace  pretibial edema  Mental Status: Judgment, insight: Intact Orientation: Oriented to time, place, and person Mood and affect: No depression, anxiety, or agitation     DATA REVIEWED:     Latest Reference Range & Units 12/14/22 13:02  TSH 0.35 - 5.50 uIU/mL <0.01 (L)  T4,Free(Direct) 0.60 - 1.60 ng/dL 1.61 (H)    Latest Reference Range & Units 11/08/21 12:03  Sodium 135 - 145 mEq/L 143  Potassium 3.5 - 5.1 mEq/L 4.1  Chloride 96 - 112 mEq/L 104  CO2 19 - 32  mEq/L 29  Glucose 70 - 99 mg/dL 096 (H)  BUN 6 - 23 mg/dL 19  Creatinine 0.45 - 4.09 mg/dL 8.11  Calcium 8.4 - 91.4 mg/dL 9.6  Alkaline Phosphatase 39 - 117 U/L 167 (H)  Albumin 3.5 - 5.2 g/dL 4.2  AST 0 - 37 U/L 18  ALT 0 - 53 U/L 29  Total Protein 6.0 - 8.3 g/dL 7.4  Total Bilirubin 0.2 - 1.2 mg/dL 0.4  GFR >60.00 mL/min 71.30  WBC 4.0 - 10.5 K/uL 7.8  RBC 4.22 - 5.81 Mil/uL 5.67  Hemoglobin 13.0 - 17.0 g/dL 40.9  HCT 81.1 - 91.4 % 43.9  MCV 78.0 - 100.0 fl 77.4 (L)  MCHC 30.0 - 36.0 g/dL 78.2  RDW 95.6 - 21.3 % 14.9  Platelets 150.0 - 400.0 K/uL 290.0  Neutrophils 43.0 - 77.0 % 54.3  Lymphocytes 12.0 - 46.0 % 36.5  Monocytes Relative 3.0 - 12.0 % 8.4  Eosinophil 0.0 - 5.0 % 0.5  Basophil 0.0 - 3.0 % 0.3  NEUT# 1.4 - 7.7 K/uL 4.2  Lymphocyte # 0.7 - 4.0 K/uL 2.8  Monocyte # 0.1 - 1.0 K/uL 0.7  Eosinophils Absolute 0.0 - 0.7 K/uL 0.0  Basophils Absolute 0.0 - 0.1 K/uL 0.0  TSH 0.35 - 5.50 uIU/mL <0.01  Triiodothyronine (T3) 76 - 181 ng/dL 086 (H)  V7,QION(GEXBMW) 0.60 - 1.60 ng/dL 4.13     Latest Reference Range & Units Most Recent  TRAB <=2.00 IU/L 10.94 (H) 11/08/21 12:03      ASSESSMENT/PLAN/RECOMMENDATIONS:   Hyperthyroidism:  -Patient has not been to our office in 13 months, and I suspect he has missed methimazole more than just 4 days -TFTs show hyperthyroidism -Will restart methimazole -Discussed the importance of keeping up with his appointment   Medications : Restart methimazole 10 mg, 1.5 tabs daily  2. Graves' Disease:  -No extrathyroidal manifestations of Graves' disease    Follow-up in 6 months  Signed electronically by: Lyndle Herrlich, MD  [facility] Endocrinology  [facility] [address] [place], [place] [address] Phone: [phone] FAX: [phone]   CC: Emilio Aspen, MD [address], [address] [place] [address] Phone: [phone] Fax: [phone]   Return to  Endocrinology clinic as below: No future appointments.

## 2022-12-20 ENCOUNTER — Emergency Department (HOSPITAL_COMMUNITY)
Admission: EM | Admit: 2022-12-20 | Discharge: 2022-12-20 | Disposition: A | Discharge status: Home / Self Care | Payer: 59 | Attending: Emergency Medicine | Admitting: Emergency Medicine

## 2022-12-20 ENCOUNTER — Other Ambulatory Visit: Payer: Self-pay

## 2022-12-20 ENCOUNTER — Emergency Department (HOSPITAL_COMMUNITY): Payer: 59

## 2022-12-20 ENCOUNTER — Encounter (HOSPITAL_COMMUNITY): Payer: Self-pay | Admitting: *Deleted

## 2022-12-20 DIAGNOSIS — Y93F2 Activity, caregiving, lifting: Secondary | ICD-10-CM | POA: Diagnosis not present

## 2022-12-20 DIAGNOSIS — E119 Type 2 diabetes mellitus without complications: Secondary | ICD-10-CM | POA: Diagnosis not present

## 2022-12-20 DIAGNOSIS — W19XXXA Unspecified fall, initial encounter: Secondary | ICD-10-CM | POA: Diagnosis not present

## 2022-12-20 DIAGNOSIS — Z79899 Other long term (current) drug therapy: Secondary | ICD-10-CM | POA: Insufficient documentation

## 2022-12-20 DIAGNOSIS — M545 Low back pain, unspecified: Secondary | ICD-10-CM | POA: Diagnosis not present

## 2022-12-20 DIAGNOSIS — M48061 Spinal stenosis, lumbar region without neurogenic claudication: Secondary | ICD-10-CM | POA: Diagnosis not present

## 2022-12-20 DIAGNOSIS — I10 Essential (primary) hypertension: Secondary | ICD-10-CM | POA: Diagnosis not present

## 2022-12-20 DIAGNOSIS — M5126 Other intervertebral disc displacement, lumbar region: Secondary | ICD-10-CM | POA: Diagnosis not present

## 2022-12-20 DIAGNOSIS — M5459 Other low back pain: Secondary | ICD-10-CM | POA: Diagnosis not present

## 2022-12-20 DIAGNOSIS — M549 Dorsalgia, unspecified: Secondary | ICD-10-CM | POA: Diagnosis not present

## 2022-12-20 LAB — CBG MONITORING, ED: Glucose-Capillary: 118 mg/dL — ABNORMAL HIGH (ref 70–99)

## 2022-12-20 MED ORDER — ACETAMINOPHEN 325 MG PO TABS: 650.0000 mg | ORAL_TABLET | Freq: Four times a day (QID) | ORAL | 0 refills | Status: AC | PRN

## 2022-12-20 MED ORDER — CYCLOBENZAPRINE HCL 10 MG PO TABS: 10.0000 mg | ORAL_TABLET | Freq: Three times a day (TID) | ORAL | 0 refills | Status: AC | PRN

## 2022-12-20 MED ORDER — LIDOCAINE 5 % EX PTCH: 1.0000 | MEDICATED_PATCH | CUTANEOUS | 0 refills | Status: AC

## 2022-12-20 MED ORDER — DIPHENHYDRAMINE HCL 50 MG/ML IJ SOLN
25.0000 mg | Freq: Once | INTRAMUSCULAR | Status: AC
  Administered 2022-12-20: 25 mg via INTRAVENOUS
  Filled 2022-12-20: qty 1

## 2022-12-20 MED ORDER — ONDANSETRON HCL 4 MG/2ML IJ SOLN
4.0000 mg | Freq: Once | INTRAMUSCULAR | Status: AC
  Administered 2022-12-20: 4 mg via INTRAVENOUS
  Filled 2022-12-20: qty 2

## 2022-12-20 MED ORDER — DEXAMETHASONE SODIUM PHOSPHATE 10 MG/ML IJ SOLN
10.0000 mg | Freq: Once | INTRAMUSCULAR | Status: AC
  Administered 2022-12-20: 10 mg via INTRAVENOUS
  Filled 2022-12-20: qty 1

## 2022-12-20 MED ORDER — MORPHINE SULFATE (PF) 4 MG/ML IV SOLN
4.0000 mg | Freq: Once | INTRAVENOUS | Status: AC
  Administered 2022-12-20: 4 mg via INTRAVENOUS
  Filled 2022-12-20: qty 1

## 2022-12-20 MED ORDER — OXYCODONE HCL 5 MG PO TABS: 5.0000 mg | ORAL_TABLET | ORAL | 0 refills | Status: AC | PRN

## 2022-12-20 MED ORDER — CYCLOBENZAPRINE HCL 10 MG PO TABS
10.0000 mg | ORAL_TABLET | Freq: Once | ORAL | Status: AC
  Administered 2022-12-20: 10 mg via ORAL
  Filled 2022-12-20: qty 1

## 2022-12-20 NOTE — Discharge Instructions (Addendum)
Thank you for letting us take care of you today.  Overall, your workup was reassuring.  Your MRI did not show any significant acute findings to explain your pain today.  I believe is a flareup of your chronic pain.  We gave you multiple medications to treat this and I am sending you home with medications as well.  Please take the nonnarcotic pain medications as first-line to help with pain.  For any continued severe, breakthrough pain, you may take the oxycodone as needed.  Please follow-up with your PCP within the next week to discuss your ED visit today and any continued pain.  If you develop any new or worsening symptoms, please return to the nearest ED for reevaluation.

## 2022-12-20 NOTE — ED Triage Notes (Signed)
Pt with hx of bulging disc is here for lower back pain.  Pt was bending down when he had sudden, severe lower back pain.  Pt reports 10/10 pain, no distal neuraligia

## 2022-12-20 NOTE — ED Notes (Signed)
Pt wheeled from ed . Verbalized understanding of discharge instructions.

## 2022-12-20 NOTE — ED Provider Notes (Signed)
Jeffery [name]   CSN: [id number] Arrival date & time: 12/20/22  1651     History  Chief Complaint  Patient presents with   Back Pain    Jeffery [name] is a 62 y.o. male with PMH HTN, DM, bulging disk, chronic back pain, who presents for ED complaining of severe right-sided lower back pain.  He states that he has a history of bulging disc with chronic back problems but has not had any issues since November of last year.  He has not required his pain medication in many months.  Today he was carrying groceries into the house when he had sudden onset of right-sided lower back pain that caused him to fall to the ground.  He was unable to stand up without significant assistance.  He denies pain radiating down the leg, saddle anesthesia, lower extremity weakness, fever, chills, abdominal pain, nausea, vomiting, diarrhea. No history of malignancy or IVDU. States he has severe pain when lifting right leg. No medications taken prior to arrival. Notes items he was carrying were not heavy. Reports good glycemic control at home with blood glucose in 100s.    Home Medications Prior to Admission medications   Medication Sig Start Date End Date Taking? Authorizing Provider  acetaminophen (TYLENOL) 325 MG tablet Take 2 tablets (650 mg total) by mouth every 6 (six) hours as needed for up to 3 days for mild pain or moderate pain. 12/20/22 12/23/22 Yes Jalisha Enneking L, PA-C  cyclobenzaprine (FLEXERIL) 10 MG tablet Take 1 tablet (10 mg total) by mouth 3 (three) times daily as needed for up to 3 days for muscle spasms. 12/20/22 12/23/22 Yes Tauri Ethington L, PA-C  lidocaine (LIDODERM) 5 % Place 1 patch onto the skin daily for 3 days. Remove & Discard patch within 12 hours or as directed by MD 12/20/22 12/23/22 Yes Zeta Bucy L, PA-C  oxyCODONE (ROXICODONE) 5 MG immediate release tablet Take 1 tablet (5 mg total) by mouth every 4 (four) hours as needed for up  to 3 days for severe pain. 12/20/22 12/23/22 Yes Candice Lunney L, PA-C  amLODipine (NORVASC) 5 MG tablet Take 2.5 mg by mouth daily.    [provider]  Coenzyme Q10 (CO Q 10 PO) Take 1 tablet by mouth daily.    [provider]  diazepam (VALIUM) 5 MG tablet Take 1 tablet 30 to 60 minutes prior to MRI scan.  Do not operate motor vehicle while taking this medication. 03/16/22   Magnant, Charles L, PA-C  hydrochlorothiazide (HYDRODIURIL) 25 MG tablet Take 25 mg by mouth daily. 07/03/21   [provider]  indomethacin (INDOCIN) 25 MG capsule take 1-2 capsules by mouth three times a day if needed for JOINT PAIN 03/18/17   [provider]  losartan (COZAAR) 100 MG tablet Take 100 mg by mouth daily.    [provider]  meloxicam (MOBIC) 15 MG tablet Take 1 tablet (15 mg total) by mouth daily. 12/03/22   Magnant, Joycie Peek, PA-C  methimazole (TAPAZOLE) 10 MG tablet Take 1.5 tablets (15 mg total) by mouth daily. 12/14/22   Shamleffer, Konrad Dolores, MD  Omega 3 1000 MG CAPS 1 capsule    [provider]  omega-3 acid ethyl esters (LOVAZA) 1 G capsule Take 1 g by mouth daily.    [provider]  rosuvastatin (CRESTOR) 10 MG tablet Take 10 mg by mouth at bedtime. 09/06/21   [provider]  Allergies    Other and Nsaids    Review of Systems   Review of Systems  All other systems reviewed and are negative.   Physical Exam Updated Vital Signs BP (!) 152/90 (BP Location: Left Arm)   Pulse (!) 107   Temp 98.5 F (36.9 C) (Oral)   Resp 18   SpO2 97%  Physical Exam Vitals and nursing [name] reviewed.  Constitutional:      General: He is in acute distress (mild secondary to pain, appears very uncomfortable, difficulty sitting up secondary to lower back pain).     Appearance: Normal appearance. He is not ill-appearing, toxic-appearing or diaphoretic.  HENT:     Head: Normocephalic and atraumatic.     Mouth/Throat:     Mouth:  Mucous membranes are moist.  Eyes:     General: No scleral icterus.    Extraocular Movements: Extraocular movements intact.     Conjunctiva/sclera: Conjunctivae normal.  Neck:     Comments: No meningismus Cardiovascular:     Rate and Rhythm: Normal rate and regular rhythm.     Heart sounds: No murmur heard. Pulmonary:     Effort: Pulmonary effort is normal.     Breath sounds: Normal breath sounds.  Abdominal:     General: Abdomen is flat.     Palpations: Abdomen is soft.     Tenderness: There is no abdominal tenderness.  Musculoskeletal:        General: No deformity.     Cervical back: Normal range of motion and neck supple. No rigidity or tenderness.     Right lower leg: No edema.     Left lower leg: No edema.     Comments: Diffuse lumbar tenderness to palpation, no stepoffs or deformities, difficulty changing positions secondary to pain, mild weakness/restricted range of motion in right lower extremity compared to left secondary to pain; no midline CT spinal tendnerness, stepoffs, or deformities; 5/5 strength in bilateral UE, normal sensation distally, extremities warm and well perfused, intact patellar reflexes  Skin:    General: Skin is warm and dry.     Capillary Refill: Capillary refill takes less than 2 seconds.  Neurological:     Mental Status: He is alert. Mental status is at baseline.  Psychiatric:        Behavior: Behavior normal.     ED Results / Procedures / Treatments   Labs (all labs ordered are listed, but only abnormal results are displayed) Labs Reviewed  CBG MONITORING, ED - Abnormal; Notable for the following components:      Result Value   Glucose-Capillary 118 (*)    All other components within normal limits    EKG None  Radiology MR LUMBAR SPINE WO CONTRAST  Result Date: 12/20/2022 CLINICAL DATA:  Lumbar radiculopathy.  Severe lower back pain. EXAM: MRI LUMBAR SPINE WITHOUT CONTRAST TECHNIQUE: Multiplanar, multisequence MR imaging of the lumbar  spine was performed. No intravenous contrast was administered. COMPARISON:  MRI lumbar spine 06/18/2022. FINDINGS: Segmentation: Conventional numbering is assumed with 5 non-rib-bearing, lumbar type vertebral bodies. Alignment:  Normal. Vertebrae: Mild Modic type 1 degenerative endplate marrow signal changes at L5-S1, improved from prior. Conus medullaris and cauda equina: Conus extends to the L2 level. Conus and cauda equina appear normal. Paraspinal and other soft tissues: Mild fatty atrophy of the paraspinal muscles. Disc levels: Bony ankylosis of the bilateral sacroiliac joints. T12-L1:  Normal. L1-L2:  Normal. L2-L3: Small disc bulge without spinal canal stenosis or neural foraminal narrowing. L3-L4: Disc bulge  with central annular fissure results in mild spinal canal stenosis, unchanged from prior. L4-L5: Small disc bulge with central annular fissure, unchanged. No spinal canal stenosis. Right-greater-than-left facet arthropathy results in mild bilateral neural foraminal narrowing, unchanged. L5-S1: Unchanged severe disc height loss. Decreased left subarticular disc protrusion. No significant spinal canal stenosis or neural foraminal narrowing. IMPRESSION: 1. Mild multilevel lumbar spondylosis, slightly improved from the prior study dated 06/18/2022. 2. Unchanged mild spinal canal stenosis at L3-4. 3. No high-grade neural foraminal narrowing. Electronically Signed   By: Orvan Falconer M.D.   On: 12/20/2022 19:40    Procedures Procedures    Medications Ordered in ED Medications  morphine (PF) 4 MG/ML injection 4 mg (4 mg Intravenous Given 12/20/22 1812)  ondansetron (ZOFRAN) injection 4 mg (4 mg Intravenous Given 12/20/22 1813)  dexamethasone (DECADRON) injection 10 mg (10 mg Intravenous Given 12/20/22 1813)  diphenhydrAMINE (BENADRYL) injection 25 mg (25 mg Intravenous Given 12/20/22 1813)  cyclobenzaprine (FLEXERIL) tablet 10 mg (10 mg Oral Given 12/20/22 1812)  morphine (PF) 4 MG/ML injection 4 mg  (4 mg Intravenous Given 12/20/22 2010)    ED Course/ Medical Decision Making/ A&P                             Medical Decision Making Amount and/or Complexity of Data Reviewed Radiology: ordered. Decision-making details documented in ED Course.  Risk OTC drugs. Prescription drug management.   Medical Decision Making:   RIXTON LIEB is a 62 y.o. male who presented to the ED today with back pain detailed above.    Additional history discussed with patient's family/caregivers.  Patient's presentation is complicated by their history of bulging disk, chronic back pain, injury.  Complete initial physical exam performed, notably the patient  was in mild distress secondary to pain. Diffuse lumbar tenderness to palpation, no stepoffs or deformities, difficulty changing positions secondary to pain, mild weakness/restricted range of motion in right lower extremity compared to left secondary to pain; no midline CT spinal tendnerness, stepoffs, or deformities; 5/5 strength in bilateral UE, normal sensation distally, extremities warm and well perfused, intact patellar reflexes.    Reviewed and confirmed nursing documentation for past medical history, family history, social history.    Initial Assessment:   With the patient's presentation of back pain, the emergent differential diagnosis for back pain includes but is not limited to fracture, muscle strain, cauda equina, spinal stenosis, DDD, ankylosing spondylitis, acute ligamentous injury, disk herniation, spondylolisthesis, epidural compression syndrome, metastatic cancer, transverse myelitis, vertebral osteomyelitis, diskitis, kidney stone, pyelonephritis, AAA, Perforated ulcer, retrocecal appendicitis, pancreatitis, bowel obstruction, retroperitoneal hemorrhage or mass, meningitis.   Initial Plan:  CBG to assess glycemic control MR L spine to assess etiology of back pain Symptomatic management Objective evaluation as reviewed   Initial Study  Results:   Radiology:  All images reviewed independently. Agree with radiology report at this time.   MR LUMBAR SPINE WO CONTRAST  Result Date: 12/20/2022 CLINICAL DATA:  Lumbar radiculopathy.  Severe lower back pain. EXAM: MRI LUMBAR SPINE WITHOUT CONTRAST TECHNIQUE: Multiplanar, multisequence MR imaging of the lumbar spine was performed. No intravenous contrast was administered. COMPARISON:  MRI lumbar spine 06/18/2022. FINDINGS: Segmentation: Conventional numbering is assumed with 5 non-rib-bearing, lumbar type vertebral bodies. Alignment:  Normal. Vertebrae: Mild Modic type 1 degenerative endplate marrow signal changes at L5-S1, improved from prior. Conus medullaris and cauda equina: Conus extends to the L2 level. Conus and cauda equina appear normal. Paraspinal  and other soft tissues: Mild fatty atrophy of the paraspinal muscles. Disc levels: Bony ankylosis of the bilateral sacroiliac joints. T12-L1:  Normal. L1-L2:  Normal. L2-L3: Small disc bulge without spinal canal stenosis or neural foraminal narrowing. L3-L4: Disc bulge with central annular fissure results in mild spinal canal stenosis, unchanged from prior. L4-L5: Small disc bulge with central annular fissure, unchanged. No spinal canal stenosis. Right-greater-than-left facet arthropathy results in mild bilateral neural foraminal narrowing, unchanged. L5-S1: Unchanged severe disc height loss. Decreased left subarticular disc protrusion. No significant spinal canal stenosis or neural foraminal narrowing. IMPRESSION: 1. Mild multilevel lumbar spondylosis, slightly improved from the prior study dated 06/18/2022. 2. Unchanged mild spinal canal stenosis at L3-4. 3. No high-grade neural foraminal narrowing. Electronically Signed   By: Orvan Falconer M.D.   On: 12/20/2022 19:40      Final Assessment and Plan:   Patient presents to ED c/o back pain. No red flag symptoms but does c/o severe pain. No history of malignancy or IV drug use. No severe trauma  to the back.  History of bulging disc but overall well-controlled chronic pain.  Patient nontoxic-appearing on exam.  No meningismus.  Diffuse lumbar tenderness but no deformities.  Patient appears very uncomfortable and has difficulty changing position on initial exam.  Medications given as above for symptomatic control.  With patient's significant history of back pain, discussed risk/benefits of ordering MRI and patient agreeable.  MRI shows no acute findings or complications to explain patient's symptoms.  Patient with good pain control following medications initially given an additional dose of morphine.  Reviewed PDMP.  He had recent narcotic pain medication prescriptions.  Will send patient home with medications to help with pain and have him follow-up closely with PCP.  Patient expressed understanding of plan.  Strict ED return precautions given, all questions answered, and stable for discharge.   Clinical Impression:  1. Acute right-sided low back pain without sciatica      Discharge           Final Clinical Impression(s) / ED Diagnoses Final diagnoses:  Acute right-sided low back pain without sciatica    Rx / DC Orders ED Discharge Orders          Ordered    cyclobenzaprine (FLEXERIL) 10 MG tablet  3 times daily PRN        12/20/22 2106    lidocaine (LIDODERM) 5 %  Every 24 hours        12/20/22 2106    acetaminophen (TYLENOL) 325 MG tablet  Every 6 hours PRN        12/20/22 2106    oxyCODONE (ROXICODONE) 5 MG immediate release tablet  Every 4 hours PRN        12/20/22 2106              Tonette Lederer, PA-C 12/20/22 2146    Vanetta Mulders, MD 12/22/22 2330

## 2022-12-22 ENCOUNTER — Other Ambulatory Visit: Payer: Self-pay | Admitting: Surgical

## 2022-12-24 DIAGNOSIS — M9901 Segmental and somatic dysfunction of cervical region: Secondary | ICD-10-CM | POA: Diagnosis not present

## 2022-12-24 DIAGNOSIS — M5386 Other specified dorsopathies, lumbar region: Secondary | ICD-10-CM | POA: Diagnosis not present

## 2022-12-24 DIAGNOSIS — M9902 Segmental and somatic dysfunction of thoracic region: Secondary | ICD-10-CM | POA: Diagnosis not present

## 2022-12-24 DIAGNOSIS — M9903 Segmental and somatic dysfunction of lumbar region: Secondary | ICD-10-CM | POA: Diagnosis not present

## 2022-12-24 DIAGNOSIS — M531 Cervicobrachial syndrome: Secondary | ICD-10-CM | POA: Diagnosis not present

## 2022-12-25 DIAGNOSIS — M9902 Segmental and somatic dysfunction of thoracic region: Secondary | ICD-10-CM | POA: Diagnosis not present

## 2022-12-25 DIAGNOSIS — M9903 Segmental and somatic dysfunction of lumbar region: Secondary | ICD-10-CM | POA: Diagnosis not present

## 2022-12-25 DIAGNOSIS — M9901 Segmental and somatic dysfunction of cervical region: Secondary | ICD-10-CM | POA: Diagnosis not present

## 2022-12-25 DIAGNOSIS — M5386 Other specified dorsopathies, lumbar region: Secondary | ICD-10-CM | POA: Diagnosis not present

## 2022-12-25 DIAGNOSIS — M531 Cervicobrachial syndrome: Secondary | ICD-10-CM | POA: Diagnosis not present

## 2022-12-31 DIAGNOSIS — M5386 Other specified dorsopathies, lumbar region: Secondary | ICD-10-CM | POA: Diagnosis not present

## 2022-12-31 DIAGNOSIS — M9902 Segmental and somatic dysfunction of thoracic region: Secondary | ICD-10-CM | POA: Diagnosis not present

## 2022-12-31 DIAGNOSIS — M9903 Segmental and somatic dysfunction of lumbar region: Secondary | ICD-10-CM | POA: Diagnosis not present

## 2022-12-31 DIAGNOSIS — M9901 Segmental and somatic dysfunction of cervical region: Secondary | ICD-10-CM | POA: Diagnosis not present

## 2022-12-31 DIAGNOSIS — M531 Cervicobrachial syndrome: Secondary | ICD-10-CM | POA: Diagnosis not present

## 2023-01-01 DIAGNOSIS — M9903 Segmental and somatic dysfunction of lumbar region: Secondary | ICD-10-CM | POA: Diagnosis not present

## 2023-01-01 DIAGNOSIS — M531 Cervicobrachial syndrome: Secondary | ICD-10-CM | POA: Diagnosis not present

## 2023-01-01 DIAGNOSIS — M9901 Segmental and somatic dysfunction of cervical region: Secondary | ICD-10-CM | POA: Diagnosis not present

## 2023-01-01 DIAGNOSIS — M5386 Other specified dorsopathies, lumbar region: Secondary | ICD-10-CM | POA: Diagnosis not present

## 2023-01-01 DIAGNOSIS — M9902 Segmental and somatic dysfunction of thoracic region: Secondary | ICD-10-CM | POA: Diagnosis not present

## 2023-01-07 DIAGNOSIS — M9903 Segmental and somatic dysfunction of lumbar region: Secondary | ICD-10-CM | POA: Diagnosis not present

## 2023-01-07 DIAGNOSIS — M5386 Other specified dorsopathies, lumbar region: Secondary | ICD-10-CM | POA: Diagnosis not present

## 2023-01-07 DIAGNOSIS — M9902 Segmental and somatic dysfunction of thoracic region: Secondary | ICD-10-CM | POA: Diagnosis not present

## 2023-01-07 DIAGNOSIS — M531 Cervicobrachial syndrome: Secondary | ICD-10-CM | POA: Diagnosis not present

## 2023-01-07 DIAGNOSIS — M9901 Segmental and somatic dysfunction of cervical region: Secondary | ICD-10-CM | POA: Diagnosis not present

## 2023-01-15 DIAGNOSIS — M9902 Segmental and somatic dysfunction of thoracic region: Secondary | ICD-10-CM | POA: Diagnosis not present

## 2023-01-15 DIAGNOSIS — M531 Cervicobrachial syndrome: Secondary | ICD-10-CM | POA: Diagnosis not present

## 2023-01-15 DIAGNOSIS — M9903 Segmental and somatic dysfunction of lumbar region: Secondary | ICD-10-CM | POA: Diagnosis not present

## 2023-01-15 DIAGNOSIS — M5386 Other specified dorsopathies, lumbar region: Secondary | ICD-10-CM | POA: Diagnosis not present

## 2023-01-15 DIAGNOSIS — M9901 Segmental and somatic dysfunction of cervical region: Secondary | ICD-10-CM | POA: Diagnosis not present

## 2023-01-21 ENCOUNTER — Other Ambulatory Visit: Payer: Self-pay | Admitting: Surgical

## 2023-01-21 DIAGNOSIS — M9903 Segmental and somatic dysfunction of lumbar region: Secondary | ICD-10-CM | POA: Diagnosis not present

## 2023-01-21 DIAGNOSIS — M531 Cervicobrachial syndrome: Secondary | ICD-10-CM | POA: Diagnosis not present

## 2023-01-21 DIAGNOSIS — M5386 Other specified dorsopathies, lumbar region: Secondary | ICD-10-CM | POA: Diagnosis not present

## 2023-01-21 DIAGNOSIS — M9901 Segmental and somatic dysfunction of cervical region: Secondary | ICD-10-CM | POA: Diagnosis not present

## 2023-01-21 DIAGNOSIS — M9902 Segmental and somatic dysfunction of thoracic region: Secondary | ICD-10-CM | POA: Diagnosis not present

## 2023-01-21 MED ORDER — MELOXICAM 15 MG PO TABS: 15.0000 mg | ORAL_TABLET | Freq: Every day | ORAL | 2 refills | Status: DC | PRN

## 2023-01-21 NOTE — Telephone Encounter (Signed)
From: [place] To: Office of [place], [place] Sent: 01/20/2023 4:50 PM EDT Subject: Medication Renewal Request  Refills have been requested for the following medications:   meloxicam (MOBIC) 15 MG tablet [Luke Demetrice Combes]  Preferred pharmacy: [id number] - [place], [place] - [address] AT [address] Delivery method: [employer]

## 2023-01-28 DIAGNOSIS — M9903 Segmental and somatic dysfunction of lumbar region: Secondary | ICD-10-CM | POA: Diagnosis not present

## 2023-01-28 DIAGNOSIS — M5386 Other specified dorsopathies, lumbar region: Secondary | ICD-10-CM | POA: Diagnosis not present

## 2023-01-28 DIAGNOSIS — M9902 Segmental and somatic dysfunction of thoracic region: Secondary | ICD-10-CM | POA: Diagnosis not present

## 2023-01-28 DIAGNOSIS — M531 Cervicobrachial syndrome: Secondary | ICD-10-CM | POA: Diagnosis not present

## 2023-01-28 DIAGNOSIS — M9901 Segmental and somatic dysfunction of cervical region: Secondary | ICD-10-CM | POA: Diagnosis not present

## 2023-02-05 DIAGNOSIS — M9901 Segmental and somatic dysfunction of cervical region: Secondary | ICD-10-CM | POA: Diagnosis not present

## 2023-02-05 DIAGNOSIS — M5386 Other specified dorsopathies, lumbar region: Secondary | ICD-10-CM | POA: Diagnosis not present

## 2023-02-05 DIAGNOSIS — M9902 Segmental and somatic dysfunction of thoracic region: Secondary | ICD-10-CM | POA: Diagnosis not present

## 2023-02-05 DIAGNOSIS — M531 Cervicobrachial syndrome: Secondary | ICD-10-CM | POA: Diagnosis not present

## 2023-02-05 DIAGNOSIS — M9903 Segmental and somatic dysfunction of lumbar region: Secondary | ICD-10-CM | POA: Diagnosis not present

## 2023-02-12 DIAGNOSIS — M5386 Other specified dorsopathies, lumbar region: Secondary | ICD-10-CM | POA: Diagnosis not present

## 2023-02-12 DIAGNOSIS — M9903 Segmental and somatic dysfunction of lumbar region: Secondary | ICD-10-CM | POA: Diagnosis not present

## 2023-02-12 DIAGNOSIS — M531 Cervicobrachial syndrome: Secondary | ICD-10-CM | POA: Diagnosis not present

## 2023-02-12 DIAGNOSIS — M9902 Segmental and somatic dysfunction of thoracic region: Secondary | ICD-10-CM | POA: Diagnosis not present

## 2023-02-12 DIAGNOSIS — M9901 Segmental and somatic dysfunction of cervical region: Secondary | ICD-10-CM | POA: Diagnosis not present

## 2023-02-18 ENCOUNTER — Other Ambulatory Visit: Payer: Self-pay | Admitting: Surgical

## 2023-02-18 DIAGNOSIS — M5386 Other specified dorsopathies, lumbar region: Secondary | ICD-10-CM | POA: Diagnosis not present

## 2023-02-18 DIAGNOSIS — M9901 Segmental and somatic dysfunction of cervical region: Secondary | ICD-10-CM | POA: Diagnosis not present

## 2023-02-18 DIAGNOSIS — M9903 Segmental and somatic dysfunction of lumbar region: Secondary | ICD-10-CM | POA: Diagnosis not present

## 2023-02-18 DIAGNOSIS — M531 Cervicobrachial syndrome: Secondary | ICD-10-CM | POA: Diagnosis not present

## 2023-02-18 DIAGNOSIS — M9902 Segmental and somatic dysfunction of thoracic region: Secondary | ICD-10-CM | POA: Diagnosis not present

## 2023-02-26 DIAGNOSIS — M5386 Other specified dorsopathies, lumbar region: Secondary | ICD-10-CM | POA: Diagnosis not present

## 2023-02-26 DIAGNOSIS — M531 Cervicobrachial syndrome: Secondary | ICD-10-CM | POA: Diagnosis not present

## 2023-02-26 DIAGNOSIS — M9901 Segmental and somatic dysfunction of cervical region: Secondary | ICD-10-CM | POA: Diagnosis not present

## 2023-02-26 DIAGNOSIS — M9903 Segmental and somatic dysfunction of lumbar region: Secondary | ICD-10-CM | POA: Diagnosis not present

## 2023-02-26 DIAGNOSIS — M9902 Segmental and somatic dysfunction of thoracic region: Secondary | ICD-10-CM | POA: Diagnosis not present

## 2023-03-06 DIAGNOSIS — Z961 Presence of intraocular lens: Secondary | ICD-10-CM | POA: Diagnosis not present

## 2023-03-06 DIAGNOSIS — I10 Essential (primary) hypertension: Secondary | ICD-10-CM | POA: Diagnosis not present

## 2023-03-06 DIAGNOSIS — Z9889 Other specified postprocedural states: Secondary | ICD-10-CM | POA: Diagnosis not present

## 2023-03-06 DIAGNOSIS — Z7984 Long term (current) use of oral hypoglycemic drugs: Secondary | ICD-10-CM | POA: Diagnosis not present

## 2023-03-06 DIAGNOSIS — H2511 Age-related nuclear cataract, right eye: Secondary | ICD-10-CM | POA: Diagnosis not present

## 2023-03-06 DIAGNOSIS — E1136 Type 2 diabetes mellitus with diabetic cataract: Secondary | ICD-10-CM | POA: Diagnosis not present

## 2023-03-20 DIAGNOSIS — E785 Hyperlipidemia, unspecified: Secondary | ICD-10-CM | POA: Diagnosis not present

## 2023-03-20 DIAGNOSIS — E669 Obesity, unspecified: Secondary | ICD-10-CM | POA: Diagnosis not present

## 2023-03-20 DIAGNOSIS — E0590 Thyrotoxicosis, unspecified without thyrotoxic crisis or storm: Secondary | ICD-10-CM | POA: Diagnosis not present

## 2023-03-20 DIAGNOSIS — Z5181 Encounter for therapeutic drug level monitoring: Secondary | ICD-10-CM | POA: Diagnosis not present

## 2023-03-20 DIAGNOSIS — I10 Essential (primary) hypertension: Secondary | ICD-10-CM | POA: Diagnosis not present

## 2023-03-20 DIAGNOSIS — E119 Type 2 diabetes mellitus without complications: Secondary | ICD-10-CM | POA: Diagnosis not present

## 2023-03-20 DIAGNOSIS — R600 Localized edema: Secondary | ICD-10-CM | POA: Diagnosis not present

## 2023-03-20 DIAGNOSIS — Z125 Encounter for screening for malignant neoplasm of prostate: Secondary | ICD-10-CM | POA: Diagnosis not present

## 2023-03-20 DIAGNOSIS — Z0000 Encounter for general adult medical examination without abnormal findings: Secondary | ICD-10-CM | POA: Diagnosis not present

## 2023-03-20 DIAGNOSIS — G8929 Other chronic pain: Secondary | ICD-10-CM | POA: Diagnosis not present

## 2023-03-20 DIAGNOSIS — M5441 Lumbago with sciatica, right side: Secondary | ICD-10-CM | POA: Diagnosis not present

## 2023-03-20 DIAGNOSIS — Z79899 Other long term (current) drug therapy: Secondary | ICD-10-CM | POA: Diagnosis not present

## 2023-03-20 DIAGNOSIS — D649 Anemia, unspecified: Secondary | ICD-10-CM | POA: Diagnosis not present

## 2023-03-25 ENCOUNTER — Ambulatory Visit (HOSPITAL_COMMUNITY)
Admission: EM | Admit: 2023-03-25 | Discharge: 2023-03-25 | Disposition: A | Discharge status: Home / Self Care | Payer: 59 | Attending: Family Medicine | Admitting: Family Medicine

## 2023-03-25 ENCOUNTER — Encounter (HOSPITAL_COMMUNITY): Payer: Self-pay

## 2023-03-25 DIAGNOSIS — E0590 Thyrotoxicosis, unspecified without thyrotoxic crisis or storm: Secondary | ICD-10-CM | POA: Insufficient documentation

## 2023-03-25 DIAGNOSIS — R42 Dizziness and giddiness: Secondary | ICD-10-CM | POA: Diagnosis not present

## 2023-03-25 DIAGNOSIS — R110 Nausea: Secondary | ICD-10-CM | POA: Insufficient documentation

## 2023-03-25 LAB — COMPREHENSIVE METABOLIC PANEL
ALT: 28 U/L (ref 0–44)
AST: 24 U/L (ref 15–41)
Albumin: 3.5 g/dL (ref 3.5–5.0)
Alkaline Phosphatase: 160 U/L — ABNORMAL HIGH (ref 38–126)
Anion gap: 9 (ref 5–15)
BUN: 10 mg/dL (ref 8–23)
CO2: 26 mmol/L (ref 22–32)
Calcium: 9.1 mg/dL (ref 8.9–10.3)
Chloride: 101 mmol/L (ref 98–111)
Creatinine, Ser: 1.08 mg/dL (ref 0.61–1.24)
GFR, Estimated: >60 mL/min (ref >60)
Glucose, Bld: 141 mg/dL — ABNORMAL HIGH (ref 70–99)
Potassium: 3.5 mmol/L (ref 3.5–5.1)
Sodium: 136 mmol/L (ref 135–145)
Total Bilirubin: 0.7 mg/dL (ref 0.3–1.2)
Total Protein: 7.1 g/dL (ref 6.5–8.1)

## 2023-03-25 LAB — CBC
HCT: 40.5 % (ref 39.0–52.0)
Hemoglobin: 13.3 g/dL (ref 13.0–17.0)
MCH: 25.9 pg — ABNORMAL LOW (ref 26.0–34.0)
MCHC: 32.8 g/dL (ref 30.0–36.0)
MCV: 78.9 fL — ABNORMAL LOW (ref 80.0–100.0)
Platelets: 279 10*3/uL (ref 150–400)
RBC: 5.13 MIL/uL (ref 4.22–5.81)
RDW: 14 % (ref 11.5–15.5)
WBC: 6.6 10*3/uL (ref 4.0–10.5)
nRBC: 0 % (ref 0.0–0.2)

## 2023-03-25 LAB — POCT FASTING CBG KUC MANUAL ENTRY: POCT Glucose (KUC): 167 mg/dL — AB (ref 70–99)

## 2023-03-25 LAB — TSH: TSH: <0.01 u[IU]/mL — ABNORMAL LOW (ref 0.350–4.500)

## 2023-03-25 MED ORDER — ONDANSETRON 4 MG PO TBDP: 4.0000 mg | ORAL_TABLET | Freq: Three times a day (TID) | ORAL | 0 refills | Status: DC | PRN

## 2023-03-25 NOTE — ED Provider Notes (Signed)
[facility]    CSN: [id number] Arrival date & time: 03/25/23  1223      History   Chief Complaint Chief Complaint  Patient presents with   Medication Reaction    HPI Jeffery [name] is a 62 y.o. male.   HPI Here for nausea and lightheadedness.  He has had the symptoms off and on in the last 3 days.  First time was on August 30 when he had taken the cyclobenzaprine.  He felt very nauseated and lightheaded and off.  He got better within an hour or 2.  Then yesterday he took oxycodone instead of the cyclobenzaprine and he had similar symptoms.  It took a couple or 3 hours for it to get better.  Today he is only taken Tylenol and has not taken his meloxicam.  He states he is not taking the indomethacin listed in his medicine list.  He checked his blood pressure earlier this morning when he felt a little nauseated and lightheaded.  He had not taken his blood pressure medications yet.  Blood pressure was 200 systolic.   He does take medication for blood pressure and for diabetes.  He has not checked his sugar today  He does not recall if he took his medications on these other days with any food in his stomach.  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 Authorizing Provider  ondansetron (ZOFRAN-ODT) 4 MG disintegrating tablet Take 1 tablet (4 mg total) by mouth every 8 (eight) hours as needed for nausea or vomiting. 03/25/23  Yes Zenia Resides, MD  amLODipine (NORVASC) 5 MG tablet Take 2.5 mg by mouth daily.    [provider]  Coenzyme Q10 (CO Q 10 PO) Take 1 tablet by mouth daily.    [provider]  diazepam (VALIUM) 5 MG tablet Take 1 tablet 30 to 60 minutes prior to MRI scan.  Do not operate motor vehicle while taking this medication. 03/16/22   Magnant, Charles L, PA-C  hydrochlorothiazide (HYDRODIURIL) 25 MG tablet Take 25 mg by mouth daily. 07/03/21   [provider]  losartan (COZAAR) 100 MG tablet Take 100 mg by mouth daily.    [provider]  meloxicam (MOBIC) 15 MG tablet Take 1 tablet (15 mg total) by mouth daily as needed for pain. 01/21/23   Magnant, Joycie Peek, PA-C  methimazole (TAPAZOLE) 10 MG tablet Take 1.5 tablets (15 mg total) by mouth daily. 12/14/22   Shamleffer, Konrad Dolores, MD  Omega 3 1000 MG CAPS 1 capsule  [provider]  omega-3 acid ethyl esters (LOVAZA) 1 G capsule Take 1 g by mouth daily.    [provider]  rosuvastatin (CRESTOR) 10 MG tablet Take 10 mg by mouth at bedtime. 09/06/21   [provider]    Family History Family History  Problem Relation Age of Onset   Diabetes Mother    Diabetes Father     Social History Social History   Tobacco Use   Smoking status: Never   Smokeless tobacco: Never  Vaping Use   Vaping status: Never Used  Substance Use Topics   Alcohol use: No   Drug use: No     Allergies   Other and Nsaids   Review of Systems Review of Systems   Physical Exam Triage Vital Signs ED Triage Vitals  Encounter Vitals Group     BP 03/25/23 1241 (!) 140/77     Systolic BP Percentile  --      Diastolic BP Percentile --      Pulse Rate 03/25/23 1241 90     Resp 03/25/23 1241 16     Temp 03/25/23 1241 97.8 F (36.6 C)     Temp Source 03/25/23 1241 Oral     SpO2 03/25/23 1241 98 %     Weight --      Height --      Head Circumference --      Peak Flow --      Pain Score 03/25/23 1243 8     Pain Loc --      Pain Education --      Exclude from Growth Chart --    No data found.  Updated Vital Signs BP (!) 140/77 (BP Location: Right Arm)   Pulse 90   Temp 97.8 F (36.6 C) (Oral)   Resp 16   SpO2 98%   Visual Acuity Right Eye Distance:   Left Eye Distance:   Bilateral Distance:    Right Eye Near:   Left Eye Near:    Bilateral Near:     Physical Exam Vitals reviewed.  Constitutional:      General: He is not in acute distress.    Appearance: He is not ill-appearing, toxic-appearing or diaphoretic.  HENT:     Mouth/Throat:     Mouth: Mucous membranes are moist.  Eyes:     Extraocular Movements: Extraocular movements intact.     Conjunctiva/sclera: Conjunctivae normal.     Pupils: Pupils are equal, round, and reactive to light.  Cardiovascular:     Rate and Rhythm: Normal rate and regular rhythm.     Heart sounds: No murmur heard. Pulmonary:     Effort: Pulmonary effort is normal.     Breath sounds: Normal breath sounds.  Abdominal:     Palpations: Abdomen is soft.     Tenderness: There is no abdominal tenderness.  Musculoskeletal:     Cervical back: Neck supple.  Lymphadenopathy:     Cervical: No cervical adenopathy.  Skin:    Coloration: Skin is not jaundiced or pale.  Neurological:     General: No focal deficit present.     Mental Status: He is alert and oriented to person, place, and time.  Psychiatric:        Behavior: Behavior normal.      UC Treatments / Results  Labs (all labs ordered are listed, but only abnormal results are displayed) Labs Reviewed  POCT FASTING CBG [facility] MANUAL ENTRY - Abnormal; Notable for the following  components:      Result Value   POCT Glucose ([facility]) 167 (*)    All other components within normal limits  CBC  COMPREHENSIVE METABOLIC PANEL  TSH  T4    EKG   Radiology No results found.  Procedures Procedures (including critical care time)  Medications Ordered in UC Medications - No data to display  Initial Impression / Assessment and Plan / UC Course  I have reviewed the triage vital signs and the nursing notes.  Pertinent labs & imaging results that were available during my care of the patient were reviewed by me and considered in my medical decision making (see chart for details).        Fingerstick sugar is 167 here in the clinic.  His blood pressure is improved to 140/77 and otherwise vital signs are overall reassuring.  CMP and CBC are drawn today as is a TSH.  He is already hyperthyroid and is on Tapazole.  Staff will notify him if anything is significantly abnormal.  Zofran is sent in for nausea.  I have asked him to follow-up with his primary care tomorrow about this issue.  And have asked him to try to take any pain medication with food in his stomach.  Final Clinical Impressions(s) / UC Diagnoses   Final diagnoses:  Nausea without vomiting  Dizziness  Hyperthyroidism     Discharge Instructions      Your sugar was 167 which is pretty good.  I do not think sugar being too low or too high is causing your symptoms.   We have drawn blood to check your blood counts, electrolytes and kidney and liver function, and your thyroid function numbers.  Staff will notify you if anything is significantly abnormal or significantly changed from previous abnormalities.  Ondansetron dissolved in the mouth every 8 hours as needed for nausea or vomiting.   Please follow-up with your primary care tomorrow.       ED Prescriptions     Medication Sig Dispense Auth. Provider   ondansetron (ZOFRAN-ODT) 4 MG disintegrating tablet Take 1 tablet (4 mg total) by mouth  every 8 (eight) hours as needed for nausea or vomiting. 10 tablet Marlinda Mike Janace Aris, MD      I have reviewed the PDMP during this encounter.   Zenia Resides, MD 03/25/23 [phone]

## 2023-03-25 NOTE — ED Triage Notes (Signed)
Patient states that he took Cyclobenzaprine 5 mg 3 days ago and states he felt nauseated and light headed. Patient continued to take this weekend. Back spasms were worse and felt weird. Patient also took an Oxycodone which helped and had taken before. Patient did take a Cyclobenzaprine today, but did take Meloxicam and Tylenol Arthritis which he has had before.

## 2023-03-25 NOTE — Discharge Instructions (Signed)
Your sugar was 167 which is pretty good.  I do not think sugar being too low or too high is causing your symptoms.   We have drawn blood to check your blood counts, electrolytes and kidney and liver function, and your thyroid function numbers.  Staff will notify you if anything is significantly abnormal or significantly changed from previous abnormalities.  Ondansetron dissolved in the mouth every 8 hours as needed for nausea or vomiting.   Please follow-up with your primary care tomorrow.

## 2023-03-27 ENCOUNTER — Telehealth: Payer: Self-pay

## 2023-03-27 DIAGNOSIS — M545 Low back pain, unspecified: Secondary | ICD-10-CM | POA: Insufficient documentation

## 2023-03-27 LAB — T4: T4, Total: 10.6 ug/dL (ref 4.5–12.0)

## 2023-03-27 NOTE — Telephone Encounter (Signed)
Patient left voicemail to get repeat injection. LVM to return call to get more information

## 2023-03-29 ENCOUNTER — Other Ambulatory Visit: Payer: Self-pay

## 2023-03-29 ENCOUNTER — Encounter (HOSPITAL_COMMUNITY): Payer: Self-pay | Admitting: Emergency Medicine

## 2023-03-29 ENCOUNTER — Emergency Department (HOSPITAL_COMMUNITY)
Admission: EM | Admit: 2023-03-29 | Discharge: 2023-03-29 | Disposition: A | Discharge status: Home / Self Care | Payer: 59 | Attending: Emergency Medicine | Admitting: Emergency Medicine

## 2023-03-29 DIAGNOSIS — I10 Essential (primary) hypertension: Secondary | ICD-10-CM | POA: Diagnosis not present

## 2023-03-29 DIAGNOSIS — E119 Type 2 diabetes mellitus without complications: Secondary | ICD-10-CM | POA: Diagnosis not present

## 2023-03-29 DIAGNOSIS — Z79899 Other long term (current) drug therapy: Secondary | ICD-10-CM | POA: Insufficient documentation

## 2023-03-29 DIAGNOSIS — M545 Low back pain, unspecified: Secondary | ICD-10-CM | POA: Insufficient documentation

## 2023-03-29 DIAGNOSIS — G8929 Other chronic pain: Secondary | ICD-10-CM | POA: Diagnosis not present

## 2023-03-29 DIAGNOSIS — M5441 Lumbago with sciatica, right side: Secondary | ICD-10-CM | POA: Diagnosis not present

## 2023-03-29 MED ORDER — OXYCODONE HCL 5 MG PO TABS
5.0000 mg | ORAL_TABLET | Freq: Once | ORAL | Status: AC
  Administered 2023-03-29: 5 mg via ORAL
  Filled 2023-03-29: qty 1

## 2023-03-29 MED ORDER — LIDOCAINE 5 % EX PTCH
1.0000 | MEDICATED_PATCH | CUTANEOUS | Status: DC
  Administered 2023-03-29: 1 via TRANSDERMAL
  Filled 2023-03-29: qty 1

## 2023-03-29 MED ORDER — CYCLOBENZAPRINE HCL 10 MG PO TABS
10.0000 mg | ORAL_TABLET | Freq: Once | ORAL | Status: AC
  Administered 2023-03-29: 10 mg via ORAL
  Filled 2023-03-29: qty 1

## 2023-03-29 MED ORDER — CYCLOBENZAPRINE HCL 10 MG PO TABS: 10.0000 mg | ORAL_TABLET | Freq: Two times a day (BID) | ORAL | 0 refills | Status: AC | PRN

## 2023-03-29 MED ORDER — DEXAMETHASONE SODIUM PHOSPHATE 10 MG/ML IJ SOLN
10.0000 mg | Freq: Once | INTRAMUSCULAR | Status: DC
  Filled 2023-03-29: qty 1

## 2023-03-29 MED ORDER — ACETAMINOPHEN 500 MG PO TABS
1000.0000 mg | ORAL_TABLET | Freq: Once | ORAL | Status: AC
  Administered 2023-03-29: 1000 mg via ORAL
  Filled 2023-03-29: qty 2

## 2023-03-29 MED ORDER — DEXAMETHASONE SODIUM PHOSPHATE 10 MG/ML IJ SOLN
10.0000 mg | Freq: Once | INTRAMUSCULAR | Status: AC
  Administered 2023-03-29: 10 mg via INTRAMUSCULAR

## 2023-03-29 NOTE — ED Provider Notes (Signed)
[facility] AT [facility] Provider Note   CSN: [id number] Arrival date & time: 03/29/23  1006     History  Chief Complaint  Patient presents with   Back Pain    Jeffery [name] is a 62 y.o. male with PMHx DM, HTN, herniated disc and chronic low back pain who presents to ED concerned with lower right sided back pain that radiates in to right leg. Patient has been following with Dr. Shon Baton at [facility] and has a surgery scheduled later this month. Patient stating that his back pain has been progressively worsening over the past week. No recent trauma. Patient ambulatory.  Patient denies urinary retention, fecal incontinence, saddle anesthesia, lower extremity weakness, hx of cancer, fever, immunosuppression, IVDU, recent spinal procedure.   Back Pain      Home Medications Prior to Admission medications   Medication Sig Start Date End Date Taking? Authorizing Provider  cyclobenzaprine (FLEXERIL) 10 MG tablet Take 1 tablet (10 mg total) by mouth 2 (two) times daily as needed for up to 5 days for muscle spasms. 03/29/23 04/03/23 Yes Kameryn Tisdel, Charlotte Sanes F, PA-C  amLODipine (NORVASC) 5 MG tablet Take 2.5 mg by mouth daily.    [provider]  Coenzyme Q10 (CO Q 10 PO) Take 1 tablet by mouth daily.    [provider]  diazepam (VALIUM) 5 MG tablet Take 1 tablet 30 to 60 minutes prior to MRI scan.  Do not operate motor vehicle while taking this medication. 03/16/22   Magnant, Charles L, PA-C  hydrochlorothiazide (HYDRODIURIL) 25 MG tablet Take 25 mg by mouth daily. 07/03/21   [provider]  losartan (COZAAR) 100 MG tablet Take 100 mg by mouth daily.    [provider]  meloxicam (MOBIC) 15 MG tablet Take 1 tablet (15 mg total) by mouth daily as needed for pain. 01/21/23   Magnant, Joycie Peek, PA-C  methimazole (TAPAZOLE) 10 MG tablet Take 1.5 tablets (15 mg total) by mouth daily. 12/14/22   Shamleffer, Konrad Dolores, MD   Omega 3 1000 MG CAPS 1 capsule    [provider]  omega-3 acid ethyl esters (LOVAZA) 1 G capsule Take 1 g by mouth daily.    [provider]  ondansetron (ZOFRAN-ODT) 4 MG disintegrating tablet Take 1 tablet (4 mg total) by mouth every 8 (eight) hours as needed for nausea or vomiting. 03/25/23   Zenia Resides, MD  rosuvastatin (CRESTOR) 10 MG tablet Take 10 mg by mouth at bedtime. 09/06/21   [provider]      Allergies    Other and Nsaids    Review of Systems   Review of Systems  Musculoskeletal:  Positive for back pain.    Physical Exam Updated Vital Signs BP (!) 141/87 (BP Location: Right Arm)   Pulse 64   Temp 97.6 F (36.4 C) (Oral)   Resp 18   SpO2 100%  Physical Exam Vitals and nursing note reviewed.  Constitutional:      General: He is not in acute distress.    Appearance: He is not ill-appearing or toxic-appearing.  HENT:     Head: Normocephalic and atraumatic.  Eyes:     General: No scleral icterus.       Right eye: No discharge.        Left eye: No discharge.     Conjunctiva/sclera: Conjunctivae normal.  Cardiovascular:     Rate and Rhythm: Normal rate.  Pulmonary:     Effort: Pulmonary  effort is normal.  Abdominal:     General: Abdomen is flat.  Musculoskeletal:     Comments: Tenderness to palpation of right lower paraspinal muscles. No tenderness to palpation of lumbar spine. Sensation intact of BL LE. +2 pedal pulses. No lesions or skin changes of lower back.  Skin:    General: Skin is warm and dry.  Neurological:     General: No focal deficit present.     Mental Status: He is alert. Mental status is at baseline.  Psychiatric:        Mood and Affect: Mood normal.        Behavior: Behavior normal.     ED Results / Procedures / Treatments   Labs (all labs ordered are listed, but only abnormal results are displayed) Labs Reviewed - No data to display  EKG None  Radiology No results  found.  Procedures Procedures    Medications Ordered in ED Medications  lidocaine (LIDODERM) 5 % 1 patch (1 patch Transdermal Patch Applied 03/29/23 1143)  acetaminophen (TYLENOL) tablet 1,000 mg (1,000 mg Oral Given 03/29/23 1141)  oxyCODONE (Oxy IR/ROXICODONE) immediate release tablet 5 mg (5 mg Oral Given 03/29/23 1141)  cyclobenzaprine (FLEXERIL) tablet 10 mg (10 mg Oral Given 03/29/23 1209)  dexamethasone (DECADRON) injection 10 mg (10 mg Intramuscular Given 03/29/23 1227)    ED Course/ Medical Decision Making/ A&P                                 Medical Decision Making Risk OTC drugs. Prescription drug management.   This patient presents to the ED for concern of back pain, this involves an extensive number of treatment options, and is a complaint that carries with it a high risk of complications and morbidity.  The differential diagnosis includes pyelonephritis, nephrolithiasis, spinal abscess, osteomyelitis, herniated disc, muscle strain, spinal fracture, meningitis, cancer, cauda equina syndrome.   Co morbidities that complicate the patient evaluation  DM, HTN, herniated disc and chronic low back pain    Problem List / ED Course / Critical interventions / Medication management  Patient presents to ED concerned for lower back pain that radiates into right leg. Physical exam with tenderness to palpation of lower right sided paraspinal muscles. Rest of physical exam reassuring. Patient afebrile with stable vitals. Patient denies urinary retention, fecal incontinence, saddle anesthesia, lower extremity weakness, hx of cancer, fever, immunosuppression, IVDU, spinal procedure, significant trauma - so imaging was not appropriate at this time. Will proceed with conservative therapy and have patient follow up with PCP. Provided patient with back pain cocktail. Patient stating that he feels a lot better and is ready to go home. I will send patient home with some flexeril per request. I  have reviewed the patients home medicines and have made adjustments as needed Patient afebrile with stable vitals. Patient ready for discharge. Provided patient with  return precaution.  Ddx: these are considered less likely due to history of present illness and physical exam -Pyelonephritis/nephrolithiasis: no abdominal or flank pain; no urinary symptoms -spinal abscess: no fever, no skin findings, no history of IVDU -osteomyelitis: no history of IVDU; pain is acute -spinal fracture: not a high force trauma associated with pain -meningitis: no fever; lack of meningism symptoms -cancer: symptoms are acute -cauda equina syndrome: denies saddle paresthesia, urinary retention, fecal incontinence   Social Determinants of Health:  none           Final Clinical  Impression(s) / ED Diagnoses Final diagnoses:  Chronic right-sided low back pain with right-sided sciatica    Rx / DC Orders ED Discharge Orders          Ordered    cyclobenzaprine (FLEXERIL) 10 MG tablet  2 times daily PRN        03/29/23 1333              Dorthy Cooler, [place] 03/29/23 1346    Derwood Kaplan, MD 03/30/23 [phone]

## 2023-03-29 NOTE — Discharge Instructions (Signed)
I am glad you are feeling better. Please follow up with primary care provider. Seek emergency care if experiencing any new or worsening symptoms.

## 2023-03-29 NOTE — ED Triage Notes (Signed)
Pt reports lower back pain that started last Friday. Pt reports he has hx of back spasms. Was treated for same in May.

## 2023-04-03 ENCOUNTER — Ambulatory Visit: Payer: 59 | Admitting: Surgical

## 2023-04-03 ENCOUNTER — Encounter: Payer: Self-pay | Admitting: Surgical

## 2023-04-03 DIAGNOSIS — M545 Low back pain, unspecified: Secondary | ICD-10-CM

## 2023-04-03 DIAGNOSIS — G8929 Other chronic pain: Secondary | ICD-10-CM

## 2023-04-05 ENCOUNTER — Encounter: Payer: Self-pay | Admitting: Surgical

## 2023-04-05 NOTE — Progress Notes (Signed)
Office Visit Note   Patient: Jeffery [name]           Date of Birth: 08-01-1960           MRN: [id number] Visit Date: 04/03/2023 Requested by: Emilio Aspen, MD [address] [place],  [place] [address] PCP: Emilio Aspen, MD  Subjective: Chief Complaint  Patient presents with   Lower Back - Pain    HPI: Jeffery [name] is a 62 y.o. male who presents to the office reporting low back pain.  Patient states that he has had low back pain for about a year.  Describes right-sided low back pain just above his buttock that has been the primary complaint over the last year.  He also has radicular pain down the right leg that has been really only symptomatic for the last 3 weeks.  His radicular pain extends from his right side of the low back down all the way to the top of his foot.  Does not involve the bottom of his foot.  He has had extensive nonsurgical treatment of his low back pain over the last year with visits to chiropractor, physical therapy, dry needling sessions.  He has been taking muscle relaxers and meloxicam.  He has also had multiple injections in his low back primarily by Dr. Alvester Morin with left L5-S1 interlaminar ESI on 04/12/2022, left L5-S1 interlaminar ESI on 05/11/2022.  He also had bilateral L4-L5 and L5-S1 lumbar facet/medial branch blocks with Dr. Alvester Morin in November 2023 that gave him no relief for any amount of time.  He recently met with Dr. Venita Lick at [facility] to discuss surgery.              ROS: All systems reviewed are negative as they relate to the chief complaint within the history of present illness.  Patient denies fevers or chills.  Assessment & Plan: Visit Diagnoses:  1. Chronic bilateral low back pain without sciatica     Plan: Patient is a 62 year old male who presents for evaluation of low back pain.  His main concern is his low back pain in the right side of the low back just above his buttock that has been ongoing for  the last year without much relief from nonsurgical treatments.  He also does have some right sided leg radiculopathy extending to the top of his foot though this is a new symptom in the last 3 weeks.  His last MRI on 12/20/2022 demonstrated multilevel spondylosis with facet arthropathy primarily at L4-L5 as well as a lack of significant spinal or foraminal stenosis.  He has had multiple ESI's with Dr. Alvester Morin in 2023 that did not really give him any significant lasting relief.  Medial branch blocks of L4-L5 and L5-S1 facet joints did not give any relief for any amount of time.  With this lack of relief, think it is reasonable to seek Dr. Gary Fleet opinion for surgical options.  However, may be worth trying right SI joint injection prior to surgical intervention primarily for diagnostic purposes.  This seems to be where the vast majority of his pain localizes on exam today.  He agreed with trying this and he will also follow-up with Dr. Shon Baton and see what he thinks.  Referral placed for right SI joint injection with Dr. Madelyn Brunner.  Follow-Up Instructions: Return if symptoms worsen or fail to improve.   Orders:  Orders Placed This Encounter  Procedures   AMB referral to sports medicine  No orders of the defined types were placed in this encounter.     Procedures: No procedures performed   Clinical Data: No additional findings.  Objective: Vital Signs: There were no vitals taken for this visit.  Physical Exam:  Constitutional: Patient appears well-developed HEENT:  Head: Normocephalic Eyes:EOM are normal Neck: Normal range of motion Cardiovascular: Normal rate Pulmonary/chest: Effort normal Neurologic: Patient is alert Skin: Skin is warm Psychiatric: Patient has normal mood and affect  Ortho Exam: Ortho exam demonstrates excellent strength in the bilateral lower extremities with intact hip flexion, quadricep, hamstring, dorsiflexion, plantarflexion, EHL rated 5/5.  No clonus noted  bilaterally.  Negative straight leg raise bilaterally today.  No pain with hip range of motion of the right hip.  Negative FADIR sign.  Negative Stinchfield sign.  He has mild to moderate tenderness throughout the axial lumbar spine.  No tenderness over the left SI joint but does have moderate tenderness over the right SI joint.  Specialty Comments:  EXAM: MRI LUMBAR SPINE WITHOUT CONTRAST   TECHNIQUE: Multiplanar, multisequence MR imaging of the lumbar spine was performed. No intravenous contrast was administered.   COMPARISON:  Lumbar spine radiographs 02/28/2022   FINDINGS: Segmentation:  Standard.   Alignment: Slight left convex curvature of the lumbar spine. Trace retrolisthesis of L5 on S1.   Vertebrae: No fracture or suspicious marrow lesion. Moderate degenerative endplate edema at A2-Z3.   Conus medullaris and cauda equina: Conus extends to the L2 level. Conus and cauda equina appear normal.   Paraspinal and other soft tissues: Unremarkable.   Disc levels:   T11-12: Only imaged sagittally. Mild disc bulging and facet arthrosis result in mild left neural foraminal stenosis without evidence of significant spinal stenosis.   T12-L1: Negative.   L1-2: Minimal leftward disc bulging and mild facet hypertrophy without stenosis.   L2-3: Mild left eccentric disc bulging and mild facet hypertrophy without stenosis.   L3-4: Disc desiccation and mild disc space narrowing. Circumferential disc bulging, a small right paracentral to subarticular disc protrusion, and mild facet hypertrophy result in borderline spinal stenosis, moderate right and mild left lateral recess stenosis, and mild-to-moderate right greater than left neural foraminal stenosis.   L4-5: Disc desiccation and mild disc space narrowing. Left eccentric disc bulging, a small left central disc protrusion with annular fissure, and moderate facet hypertrophy result in borderline spinal stenosis,  mild-to-moderate left lateral recess stenosis, and mild-to-moderate bilateral neural foraminal stenosis. The disc protrusion may affect the left L5 nerve root.   L5-S1: Disc desiccation and severe disc space narrowing. Disc bulging, a small left subarticular disc protrusion, endplate spurring, and mild facet hypertrophy result in moderate left lateral recess stenosis and mild bilateral neural foraminal stenosis without spinal stenosis. Potential left S1 nerve root impingement.   IMPRESSION: 1. Multilevel lumbar disc and facet degeneration with multiple small disc protrusions. 2. Moderate left lateral recess stenosis at L5-S1 and mild-to-moderate left lateral recess stenosis at L4-5. 3. Mild-to-moderate multilevel neural foraminal stenosis as above.     Electronically Signed   By: Sebastian Ache M.D.   On: 03/16/2022 11:22  Imaging: No results found.   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   Nuclear sclerotic cataract of both eyes 05/12/2019   Diabetes mellitus (HCC) 09/11/2018   Hypertension 09/11/2018   Hyperthyroidism 09/11/2018   Lipoma of abdominal  wall 02/12/2012   Past Medical History:  Diagnosis Date   Anal fissure    Diabetes mellitus without complication (HCC)    Fatty liver    Hyperplastic rectal polyp    Hypertension    Lipoma     Family History  Problem Relation Age of Onset   Diabetes Mother    Diabetes Father     Past Surgical History:  Procedure Laterality Date   CATARACT EXTRACTION Left    CERVICAL DISC ARTHROPLASTY  07/23/2005   C-5 and C-6   SHOULDER SURGERY  2008 and 2012   left in 2008, right had rtc tear 2012   Social History   Occupational History   Not on file  Tobacco Use   Smoking status: Never   Smokeless tobacco: Never  Vaping Use   Vaping status:  Never Used  Substance and Sexual Activity   Alcohol use: No   Drug use: No   Sexual activity: Not on file

## 2023-04-10 DIAGNOSIS — M545 Low back pain, unspecified: Secondary | ICD-10-CM | POA: Diagnosis not present

## 2023-04-19 ENCOUNTER — Ambulatory Visit: Payer: 59 | Admitting: Sports Medicine

## 2023-04-19 ENCOUNTER — Other Ambulatory Visit: Payer: Self-pay

## 2023-04-19 ENCOUNTER — Encounter: Payer: Self-pay | Admitting: Sports Medicine

## 2023-04-19 DIAGNOSIS — G8929 Other chronic pain: Secondary | ICD-10-CM

## 2023-04-19 DIAGNOSIS — M7918 Myalgia, other site: Secondary | ICD-10-CM

## 2023-04-19 DIAGNOSIS — M533 Sacrococcygeal disorders, not elsewhere classified: Secondary | ICD-10-CM

## 2023-04-19 DIAGNOSIS — M545 Low back pain, unspecified: Secondary | ICD-10-CM

## 2023-04-19 NOTE — Progress Notes (Signed)
Procedure Note  Patient: Jeffery [name]             Date of Birth: 12/04/1960           MRN: [id number]             Visit Date: 04/19/2023  Procedures: Visit Diagnoses:  1. Chronic right SI joint pain   2. Chronic bilateral low back pain without sciatica   3. Right buttock pain    U/S-guided SI-joint injection, Right   After discussion of risk/benefits/indications, informed verbal consent was obtained. A timeout was then performed. The patient was positioned in a prone position on exam room table with a pillow placed under the pelvis for mild hip flexion. The SI joint area was cleaned and prepped with betadine and alcohol swabs. Sterile ultrasound gel was applied and the ultrasound transducer was placed in an anatomic axial plane over the PSIS, then moved distally over the SI-joint. Using ultrasound guidance, a 22-gauge, 3.5" needle was inserted from a medial to lateral approach utilizing an in-plane approach and directed into the SI-joint. The SI-joint was then injected with a mixture of 4:1 lidocaine:depomedrol with visualization of the injectate flow into the SI-joint under ultrasound visualization. The patient tolerated the procedure well without immediate complications.  - I evaluated the patient about 10 minutes post-injection and they had improvement in pain and range of motion - follow-up with Dr. Valentino Nose and Karenann Cai, [facility] as indicated; I am happy to see them as needed  Madelyn Brunner, DO Primary Care Sports Medicine Physician  [facility]  This note was dictated using Dragon naturally speaking software and may contain errors in syntax, spelling, or content which have not been identified prior to signing this note.

## 2023-05-07 DIAGNOSIS — M51369 Other intervertebral disc degeneration, lumbar region without mention of lumbar back pain or lower extremity pain: Secondary | ICD-10-CM | POA: Diagnosis not present

## 2023-05-08 DIAGNOSIS — Z789 Other specified health status: Secondary | ICD-10-CM | POA: Diagnosis not present

## 2023-05-08 DIAGNOSIS — D509 Iron deficiency anemia, unspecified: Secondary | ICD-10-CM | POA: Diagnosis not present

## 2023-05-18 ENCOUNTER — Other Ambulatory Visit: Payer: Self-pay | Admitting: Surgical

## 2023-05-21 ENCOUNTER — Ambulatory Visit (HOSPITAL_COMMUNITY): Payer: Self-pay | Admitting: Orthopedic Surgery

## 2023-05-24 ENCOUNTER — Other Ambulatory Visit: Payer: Self-pay

## 2023-05-25 ENCOUNTER — Other Ambulatory Visit: Payer: Self-pay | Admitting: Surgical

## 2023-05-27 NOTE — Telephone Encounter (Signed)
Yes  thx

## 2023-05-30 DIAGNOSIS — E1136 Type 2 diabetes mellitus with diabetic cataract: Secondary | ICD-10-CM | POA: Diagnosis not present

## 2023-05-30 DIAGNOSIS — I10 Essential (primary) hypertension: Secondary | ICD-10-CM | POA: Diagnosis not present

## 2023-05-30 DIAGNOSIS — E0590 Thyrotoxicosis, unspecified without thyrotoxic crisis or storm: Secondary | ICD-10-CM | POA: Diagnosis not present

## 2023-05-30 DIAGNOSIS — Z7984 Long term (current) use of oral hypoglycemic drugs: Secondary | ICD-10-CM | POA: Diagnosis not present

## 2023-05-30 DIAGNOSIS — H25811 Combined forms of age-related cataract, right eye: Secondary | ICD-10-CM | POA: Diagnosis not present

## 2023-05-31 DIAGNOSIS — Z961 Presence of intraocular lens: Secondary | ICD-10-CM | POA: Diagnosis not present

## 2023-05-31 DIAGNOSIS — E119 Type 2 diabetes mellitus without complications: Secondary | ICD-10-CM | POA: Diagnosis not present

## 2023-05-31 DIAGNOSIS — Z9841 Cataract extraction status, right eye: Secondary | ICD-10-CM | POA: Diagnosis not present

## 2023-05-31 DIAGNOSIS — H00014 Hordeolum externum left upper eyelid: Secondary | ICD-10-CM | POA: Diagnosis not present

## 2023-06-05 DIAGNOSIS — H0014 Chalazion left upper eyelid: Secondary | ICD-10-CM | POA: Diagnosis not present

## 2023-06-10 ENCOUNTER — Ambulatory Visit: Payer: 59 | Admitting: Internal Medicine

## 2023-06-10 ENCOUNTER — Encounter: Payer: Self-pay | Admitting: Internal Medicine

## 2023-06-10 ENCOUNTER — Telehealth: Payer: Self-pay | Admitting: Internal Medicine

## 2023-06-10 VITALS — BP 134/70 | HR 100 | Ht 74.0 in | Wt 265.0 lb

## 2023-06-10 DIAGNOSIS — E0500 Thyrotoxicosis with diffuse goiter without thyrotoxic crisis or storm: Secondary | ICD-10-CM | POA: Diagnosis not present

## 2023-06-10 DIAGNOSIS — Z23 Encounter for immunization: Secondary | ICD-10-CM | POA: Diagnosis not present

## 2023-06-10 DIAGNOSIS — E0590 Thyrotoxicosis, unspecified without thyrotoxic crisis or storm: Secondary | ICD-10-CM

## 2023-06-10 LAB — TSH: TSH: 0.12 u[IU]/mL — ABNORMAL LOW (ref 0.35–5.50)

## 2023-06-10 LAB — T3, FREE: T3, Free: 3.6 pg/mL (ref 2.3–4.2)

## 2023-06-10 LAB — T4, FREE: Free T4: 0.8 ng/dL (ref 0.60–1.60)

## 2023-06-10 MED ORDER — METHIMAZOLE 10 MG PO TABS: 10.0000 mg | ORAL_TABLET | Freq: Two times a day (BID) | ORAL | 2 refills | Status: DC

## 2023-06-10 NOTE — Telephone Encounter (Signed)
Patient aware and verbalized understanding. °

## 2023-06-10 NOTE — Progress Notes (Signed)
Name: Jeffery [name]  MRN/ DOB: [id number], 02/10/1961    Age/ Sex: 62 y.o., male    PCP: Emilio Aspen, MD   Reason for Endocrinology Evaluation: Hyperthyroidism      Date of Initial Endocrinology Evaluation: 11/08/2021    HPI: [name] is a 62 y.o. male with a past medical history of T2DM, dyslipidemia and DJD. The patient presented for initial endocrinology clinic visit on 11/08/2021 for consultative assistance with his Hyperthyroidism .    He was diagnosed with hyperthyroidism in September 2019, he was noted to have abnormal thyroid hormone levels during routine testing, he was subsequently seen by endocrinologist at [facility] physicians and was started on methimazole at the time.  He eventually transitioned care to [facility] endocrinology with his last visit in 2021.   The patient has been off methimazole since 2021 until his return to his PCP in January 2023 when he was noted with palpitations and a slight tremor of her finger and that is when he was diagnosed with hyperthyroidism again and was restarted on methimazole   TRAb elevated at 10.94 IU/L 10/2021  Patient was lost to follow-up for approximately 13 months prior to his return to our office  Mother with Luiz Blare' disease   SUBJECTIVE:    Today (06/10/23): [name] is here for follow-up on hyperthyroidism.   He is scheduled for back surgery December 2024  Weight continues to fluctuate Denies local neck swelling  Denies constipation or diarrhea  Denies palpitations  Denies tremors   Methimazole 10 mg , 1.5 tabs daily        HISTORY:  Past Medical History:  Past Medical History:  Diagnosis Date   Anal fissure    Diabetes mellitus without complication (HCC)    Fatty liver    Hyperplastic rectal polyp    Hypertension    Lipoma    Lumbar degenerative disc disease    Past Surgical History:  Past Surgical History:  Procedure Laterality Date   CATARACT EXTRACTION Left     CERVICAL DISC ARTHROPLASTY  07/23/2005   C-5 and C-6   SHOULDER SURGERY  2008 and 2012   left in 2008, right had rtc tear 2012    Social History:  reports that he has never smoked. He has never used smokeless tobacco. He reports that he does not drink alcohol and does not use drugs. Family History: family history includes Diabetes in his father and mother.   HOME MEDICATIONS: Allergies as of 06/10/2023       Reactions   Other Hives   ONLY ORAL steroids. Hives on upper torso only.   Nsaids Hives   Peanut Oil Hives   Peanut-containing Drug Products Other (See Comments)   Prednisone Hives        Medication List        Accurate as of June 10, 2023  8:32 AM. If you have any questions, ask your nurse or doctor.          amLODipine 5 MG tablet Commonly known as: NORVASC Take 2.5 mg by mouth daily.   CO Q 10 PO Take 1 tablet by mouth daily.   colchicine 0.6 MG tablet 1 tablet daily for prevention Orally for flare up can take 2 tablets then repeat 2 hours later if needed for 30 day(s)   cyclobenzaprine 5 MG tablet Commonly known as: FLEXERIL Take 5 mg by mouth 3 (three) times daily as needed.   cyclobenzaprine 10 MG tablet Commonly  known as: FLEXERIL Take by mouth.   diazepam 5 MG tablet Commonly known as: Valium Take 1 tablet 30 to 60 minutes prior to MRI scan.  Do not operate motor vehicle while taking this medication.   ELDERBERRY PO Take 1 tablet by mouth daily.   HYDROCHLOROTHIAZIDE PO   hydrochlorothiazide 25 MG tablet Commonly known as: HYDRODIURIL Take 25 mg by mouth daily.   levocetirizine 5 MG tablet Commonly known as: XYZAL Take by mouth.   losartan 100 MG tablet Commonly known as: COZAAR Take 100 mg by mouth daily.   meloxicam 15 MG tablet Commonly known as: MOBIC Take by mouth.   metFORMIN 500 MG tablet Commonly known as: GLUCOPHAGE Take 500 mg by mouth 3 (three) times daily.   methimazole 10 MG tablet Commonly known as:  TAPAZOLE Take 1.5 tablets (15 mg total) by mouth daily.   methocarbamol 500 MG tablet Commonly known as: ROBAXIN TAKE 1 TABLET BY MOUTH EVERY 8 HOURS AS NEEDED FOR MUSCLE SPASM   Omega 3 1000 MG Caps 1 capsule   omega-3 acid ethyl esters 1 g capsule Commonly known as: LOVAZA Take 1 g by mouth daily.   ondansetron 4 MG disintegrating tablet Commonly known as: ZOFRAN-ODT Take 1 tablet (4 mg total) by mouth every 8 (eight) hours as needed for nausea or vomiting.   rosuvastatin 10 MG tablet Commonly known as: CRESTOR Take 10 mg by mouth at bedtime.   Valtrex 500 MG tablet Generic drug: valACYclovir 1 tablet for 3 days then stop, use as needed Orally twice a day for 90 days          REVIEW OF SYSTEMS: A comprehensive ROS was conducted with the patient and is negative except as per HPI    OBJECTIVE:  VS: BP 134/70 (BP Location: Left Arm, Patient Position: Sitting, Cuff Size: Large)   Pulse 100   Ht 6\' 2"  (1.88 m)   Wt 265 lb (120.2 kg)   SpO2 97%   BMI 34.02 kg/m    Wt Readings from Last 3 Encounters:  06/10/23 265 lb (120.2 kg)  12/14/22 258 lb (117 kg)  11/18/21 250 lb (113.4 kg)     EXAM: General: Pt appears well and is in NAD  Neck: General: Supple without adenopathy. Thyroid: Thyroid size normal.  No goiter or nodules appreciated.  Lungs: Clear with good BS bilat   Heart: Auscultation: RRR.  Extremities:  BL LE: Trace  pretibial edema  Mental Status: Judgment, insight: Intact Orientation: Oriented to time, place, and person Mood and affect: No depression, anxiety, or agitation     DATA REVIEWED:  Latest Reference Range & Units 06/10/23 08:51  TSH 0.35 - 5.50 uIU/mL 0.12 (L)  Triiodothyronine,Free,Serum 2.3 - 4.2 pg/mL 3.6  T4,Free(Direct) 0.60 - 1.60 ng/dL 1.61  (L): Data is abnormally low     Latest Reference Range & Units 11/08/21 12:03  Sodium 135 - 145 mEq/L 143  Potassium 3.5 - 5.1 mEq/L 4.1  Chloride 96 - 112 mEq/L 104  CO2 19 - 32  mEq/L 29  Glucose 70 - 99 mg/dL 096 (H)  BUN 6 - 23 mg/dL 19  Creatinine 0.45 - 4.09 mg/dL 8.11  Calcium 8.4 - 91.4 mg/dL 9.6  Alkaline Phosphatase 39 - 117 U/L 167 (H)  Albumin 3.5 - 5.2 g/dL 4.2  AST 0 - 37 U/L 18  ALT 0 - 53 U/L 29  Total Protein 6.0 - 8.3 g/dL 7.4  Total Bilirubin 0.2 - 1.2 mg/dL 0.4  GFR >  60.00 mL/min 71.30  WBC 4.0 - 10.5 K/uL 7.8  RBC 4.22 - 5.81 Mil/uL 5.67  Hemoglobin 13.0 - 17.0 g/dL 40.9  HCT 81.1 - 91.4 % 43.9  MCV 78.0 - 100.0 fl 77.4 (L)  MCHC 30.0 - 36.0 g/dL 78.2  RDW 95.6 - 21.3 % 14.9  Platelets 150.0 - 400.0 K/uL 290.0  Neutrophils 43.0 - 77.0 % 54.3  Lymphocytes 12.0 - 46.0 % 36.5  Monocytes Relative 3.0 - 12.0 % 8.4  Eosinophil 0.0 - 5.0 % 0.5  Basophil 0.0 - 3.0 % 0.3  NEUT# 1.4 - 7.7 K/uL 4.2  Lymphocyte # 0.7 - 4.0 K/uL 2.8  Monocyte # 0.1 - 1.0 K/uL 0.7  Eosinophils Absolute 0.0 - 0.7 K/uL 0.0  Basophils Absolute 0.0 - 0.1 K/uL 0.0  TSH 0.35 - 5.50 uIU/mL <0.01  Triiodothyronine (T3) 76 - 181 ng/dL 086 (H)  V7,QION(GEXBMW) 0.60 - 1.60 ng/dL 4.13     Latest Reference Range & Units Most Recent  TRAB <=2.00 IU/L 10.94 (H) 11/08/21 12:03      ASSESSMENT/PLAN/RECOMMENDATIONS:   Hyperthyroidism:  -Patient is clinically euthyroid -He assures me compliance with methimazole intake -We discussed cardiovascular and increased bone resorption risk with uncontrolled hyperthyroid -We also briefly discussed alternative therapy to include total thyroidectomy versus RAI ablation, I explained to the patient that he will need lifelong LT-4 replacement -TSH continues to be low, will increase methimazole and repeat TFTs in 2 months  Medications : Increase methimazole 10 mg, BID   2. Graves' Disease:  -No extrathyroidal manifestations of Graves' disease    Follow-up in 4 months Labs in 2 months   Signed electronically by: Lyndle Herrlich, MD  [facility] Endocrinology  [facility] [address] [place] [place], [place] [address] Phone: [phone] FAX: [phone]   CC: Emilio Aspen, MD [address] [place] [address] Phone: [phone] Fax: [phone]   Return to Endocrinology clinic as below: Future Appointments  Date Time Provider Department Center  06/10/2023  8:50 AM Ayumi Wangerin, Konrad Dolores, MD [facility] None  06/13/2023  9:00 AM Victorino Sparrow, MD [facility]

## 2023-06-10 NOTE — Telephone Encounter (Signed)
Please let the patient know that his thyroid continues to be slightly overactive, please increase methimazole to 1 tablet twice daily   Thanks

## 2023-06-12 ENCOUNTER — Other Ambulatory Visit: Payer: Self-pay

## 2023-06-12 ENCOUNTER — Encounter (HOSPITAL_COMMUNITY): Payer: Self-pay | Admitting: *Deleted

## 2023-06-12 ENCOUNTER — Emergency Department (HOSPITAL_COMMUNITY)
Admission: EM | Admit: 2023-06-12 | Discharge: 2023-06-13 | Disposition: A | Discharge status: Home / Self Care | Payer: 59 | Attending: Emergency Medicine | Admitting: Emergency Medicine

## 2023-06-12 DIAGNOSIS — Z79899 Other long term (current) drug therapy: Secondary | ICD-10-CM | POA: Insufficient documentation

## 2023-06-12 DIAGNOSIS — M545 Low back pain, unspecified: Secondary | ICD-10-CM | POA: Insufficient documentation

## 2023-06-12 DIAGNOSIS — E119 Type 2 diabetes mellitus without complications: Secondary | ICD-10-CM | POA: Insufficient documentation

## 2023-06-12 DIAGNOSIS — M5459 Other low back pain: Secondary | ICD-10-CM | POA: Diagnosis not present

## 2023-06-12 DIAGNOSIS — Z743 Need for continuous supervision: Secondary | ICD-10-CM | POA: Diagnosis not present

## 2023-06-12 DIAGNOSIS — Z91010 Allergy to peanuts: Secondary | ICD-10-CM | POA: Insufficient documentation

## 2023-06-12 DIAGNOSIS — I10 Essential (primary) hypertension: Secondary | ICD-10-CM | POA: Diagnosis not present

## 2023-06-12 NOTE — ED Triage Notes (Signed)
Arrives from home via [facility] per their report, Pt arrival with lower back spasms. Bending over today and the pain came out of now where. He tried to lay down, pain became worse when he stands up. En route, vitals 182/110, hr 94, 95% ra, cbg 209

## 2023-06-12 NOTE — Progress Notes (Unsigned)
Office Note     CC: Back pain Requesting Provider:  Emilio Aspen, *  HPI: Jeffery [name] is a 62 y.o. (1961/06/17) male presenting at the request of Dr. Shon Baton for anterior spine exposure during anterior lumbar interbody fusion L5-S1 with posterior supplemental fusion.  On exam, Jeffery [name] was doing well.  A communications graduate from [employer], he moved back to [place] after living in [place] for a short time.  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  He is happily married with 2 children and 5 grandchildren who all live in [place].  Jeffery [name] has had a significant amount of back pain over the last several years and has undergone several therapies including chiropractor, injections, physical therapy with no improvement.  He is booked for surgery with Dr. Shon Baton.  No history of abdominal surgery, back surgery.   Past Medical History:  Diagnosis Date   Anal fissure    Diabetes mellitus without complication (HCC)    Fatty liver    Hyperplastic rectal polyp    Hypertension    Lipoma    Lumbar degenerative disc disease     Past Surgical History:  Procedure Laterality Date   CATARACT EXTRACTION Left    CERVICAL DISC ARTHROPLASTY  07/23/2005   C-5 and C-6   SHOULDER SURGERY  2008 and 2012   left in 2008, right had rtc tear 2012    Social History   Socioeconomic History   Marital status: Married    Spouse name: Not on file   Number of children: Not on file   Years of education: Not on file   Highest education level: Not on file  Occupational History   Not on file  Tobacco Use   Smoking status: Never   Smokeless tobacco: Never  Vaping Use   Vaping status: Never Used  Substance and Sexual Activity   Alcohol use: No   Drug use: No   Sexual activity: Not on file  Other Topics Concern   Not on file  Social History Narrative   Not on file   Social Determinants of Health   Financial Resource Strain: Not on file  Food Insecurity:  Not on file  Transportation Needs: Not on file  Physical Activity: Not on file  Stress: Not on file  Social Connections: Not on file  Intimate Partner Violence: Not on file   Family History  Problem Relation Age of Onset   Diabetes Mother    Diabetes Father     Current Outpatient Medications  Medication Sig Dispense Refill   amLODipine (NORVASC) 5 MG tablet Take 2.5 mg by mouth daily.     Coenzyme Q10 (CO Q 10 PO) Take 1 tablet by mouth daily.     colchicine 0.6 MG tablet 1 tablet daily for prevention Orally for flare up can take 2 tablets then repeat 2 hours later if needed for 30 day(s)     cyclobenzaprine (FLEXERIL) 10 MG tablet Take by mouth.     cyclobenzaprine (FLEXERIL) 5 MG tablet Take 5 mg by mouth 3 (three) times daily as needed.     diazepam (VALIUM) 5 MG tablet Take 1 tablet 30 to 60 minutes prior to MRI scan.  Do not operate motor vehicle while taking this medication. 2 tablet 0   ELDERBERRY PO Take 1 tablet by mouth daily.     hydrochlorothiazide (HYDRODIURIL) 25 MG tablet Take 25 mg by mouth daily.     HYDROCHLOROTHIAZIDE PO  levocetirizine (XYZAL) 5 MG tablet Take by mouth.     losartan (COZAAR) 100 MG tablet Take 100 mg by mouth daily.     meloxicam (MOBIC) 15 MG tablet Take by mouth.     metFORMIN (GLUCOPHAGE) 500 MG tablet Take 500 mg by mouth 3 (three) times daily.     methimazole (TAPAZOLE) 10 MG tablet Take 1 tablet (10 mg total) by mouth 2 (two) times daily. 180 tablet 2   methocarbamol (ROBAXIN) 500 MG tablet TAKE 1 TABLET BY MOUTH EVERY 8 HOURS AS NEEDED FOR MUSCLE SPASM 30 tablet 1   Omega 3 1000 MG CAPS 1 capsule     omega-3 acid ethyl esters (LOVAZA) 1 G capsule Take 1 g by mouth daily.     ondansetron (ZOFRAN-ODT) 4 MG disintegrating tablet Take 1 tablet (4 mg total) by mouth every 8 (eight) hours as needed for nausea or vomiting. 10 tablet 0   rosuvastatin (CRESTOR) 10 MG tablet Take 10 mg by mouth at bedtime.     valACYclovir (VALTREX) 500 MG  tablet 1 tablet for 3 days then stop, use as needed Orally twice a day for 90 days     No current facility-administered medications for this visit.    Allergies  Allergen Reactions   Other Hives    ONLY ORAL steroids. Hives on upper torso only.   Nsaids Hives   Peanut Oil Hives   Peanut-Containing Drug Products Other (See Comments)   Prednisone Hives     REVIEW OF SYSTEMS:  [X]  denotes positive finding, [ ]  denotes negative finding Cardiac  Comments:  Chest pain or chest pressure:    Shortness of breath upon exertion:    Short of breath when lying flat:    Irregular heart rhythm:        Vascular    Pain in calf, thigh, or hip brought on by ambulation:    Pain in feet at night that wakes you up from your sleep:     Blood clot in your veins:    Leg swelling:         Pulmonary    Oxygen at home:    Productive cough:     Wheezing:         Neurologic    Sudden weakness in arms or legs:     Sudden numbness in arms or legs:     Sudden onset of difficulty speaking or slurred speech:    Temporary loss of vision in one eye:     Problems with dizziness:         Gastrointestinal    Blood in stool:     Vomited blood:         Genitourinary    Burning when urinating:     Blood in urine:        Psychiatric    Major depression:         Hematologic    Bleeding problems:    Problems with blood clotting too easily:        Skin    Rashes or ulcers:        Constitutional    Fever or chills:      PHYSICAL EXAMINATION:  There were no vitals filed for this visit.  General:  WDWN in NAD; vital signs documented above Gait: Not observed HENT: WNL, normocephalic Pulmonary: normal non-labored breathing , without wheezing Cardiac: regular HR Abdomen: soft, NT, no masses Skin: without rashes Vascular Exam/Pulses:  Right Left  Radial 2+ (normal)  2+ (normal)  Ulnar    Femoral    Popliteal    DP 2+ (normal) 2+ (normal)  PT     Extremities: without ischemic changes,  without Gangrene , without cellulitis; without open wounds;  Musculoskeletal: no muscle wasting or atrophy  Neurologic: A&O X 3;  No focal weakness or paresthesias are detected Psychiatric:  The pt has Normal affect.   Non-Invasive Vascular Imaging:        ASSESSMENT/PLAN: Jeffery [name] is a 62 y.o. male presenting with disc disease.  He is scheduled for anterior lumbar interbody fusion L5-S1 with posterior supplemental fusion with Dr. Shon Baton.  My portion of the case will involve safe exposure of the anterior portion of the L5-S1 disc space.  Iona Hansen and I had a long discussion regarding vascular surgery's involvement in his case, specifically safe exposure of the anterior portion of the vertebrae.  We discussed that this is accomplished in the retroperitoneal plane and involves moving iliac veins and arteries.  We discussed the risks and benefits of the above.  Including but not limited to life threatening bleeding, parasympathetic and sympathetic nerve dysfunction, infection, seroma, bowel injury, DVT.  After discussing the above, Jeffery [name] elected to proceed.    Victorino Sparrow, MD [facility] [phone]

## 2023-06-12 NOTE — ED Triage Notes (Signed)
Pt reporting he has had lower back pain from time to time. Tonight, he bent over and now having lower (bilateral) pain. Denies numbness or tingling, loss of bowel or bladder. MAE x 4.Took flexeril and robaxin for pain around 1800 without relief. Stating he is having surgery with Dr. Shon Baton on L4&L5 disc next month.

## 2023-06-13 ENCOUNTER — Encounter: Payer: Self-pay | Admitting: Vascular Surgery

## 2023-06-13 ENCOUNTER — Ambulatory Visit: Payer: 59 | Admitting: Vascular Surgery

## 2023-06-13 VITALS — BP 166/103 | HR 87 | Temp 98.0°F | Resp 20 | Ht 74.0 in | Wt 268.0 lb

## 2023-06-13 DIAGNOSIS — M545 Low back pain, unspecified: Secondary | ICD-10-CM | POA: Diagnosis not present

## 2023-06-13 DIAGNOSIS — G8929 Other chronic pain: Secondary | ICD-10-CM | POA: Diagnosis not present

## 2023-06-13 MED ORDER — KETOROLAC TROMETHAMINE 15 MG/ML IJ SOLN
15.0000 mg | Freq: Once | INTRAMUSCULAR | Status: AC
  Administered 2023-06-13: 15 mg via INTRAMUSCULAR
  Filled 2023-06-13: qty 1

## 2023-06-13 MED ORDER — HYDROCODONE-ACETAMINOPHEN 5-325 MG PO TABS
1.0000 | ORAL_TABLET | Freq: Once | ORAL | Status: AC
  Administered 2023-06-13: 1 via ORAL
  Filled 2023-06-13: qty 1

## 2023-06-13 NOTE — ED Provider Notes (Signed)
[place] EMERGENCY DEPARTMENT AT [facility] Provider Note   CSN: [id number] Arrival date & time: 06/12/23  2037     History  Chief Complaint  Patient presents with   Back Pain    Jeffery [name] is a 62 y.o. male.  Patient with history of low back pain with planned surgical intervention on December 16 with Dr. Shon Baton from [facility] presents to the emergency room via EMS complaining of low back pain which is worse than usual.  He states he was bending over to tie his shoes when he had a sudden spasm in his back.  He states he took his prescribed Robaxin with no relief of symptoms.  He denies numbness, tingling, loss of bowel or bladder.  He denies any weakness.  Past medical history otherwise significant for hypertension, lumbar degenerative disc disease, type II DM   Back Pain      Home Medications Prior to Admission medications   Medication Sig Start Date End Date Taking? Authorizing Provider  amLODipine (NORVASC) 5 MG tablet Take 2.5 mg by mouth daily.    [provider]  Coenzyme Q10 (CO Q 10 PO) Take 1 tablet by mouth daily.    [provider]  colchicine 0.6 MG tablet 1 tablet daily for prevention Orally for flare up can take 2 tablets then repeat 2 hours later if needed for 30 day(s) 02/16/14   [provider]  cyclobenzaprine (FLEXERIL) 10 MG tablet Take by mouth. 04/10/23   [provider]  cyclobenzaprine (FLEXERIL) 5 MG tablet Take 5 mg by mouth 3 (three) times daily as needed. 03/20/23   [provider]  diazepam (VALIUM) 5 MG tablet Take 1 tablet 30 to 60 minutes prior to MRI scan.  Do not operate motor vehicle while taking this medication. 03/16/22   Magnant, Charles L, PA-C  ELDERBERRY PO Take 1 tablet by mouth daily. 06/22/20   [provider]  hydrochlorothiazide (HYDRODIURIL) 25 MG tablet Take 25 mg by mouth daily. 07/03/21   [provider]  HYDROCHLOROTHIAZIDE PO     [provider]  levocetirizine (XYZAL) 5 MG tablet Take by mouth. 04/14/23   [provider]  losartan (COZAAR) 100 MG tablet Take 100 mg by mouth daily.    [provider]  meloxicam (MOBIC) 15 MG tablet Take by mouth. 04/15/23   [provider]  metFORMIN (GLUCOPHAGE) 500 MG tablet Take 500 mg by mouth 3 (three) times daily. 05/14/23   [provider]  methimazole (TAPAZOLE) 10 MG tablet Take 1 tablet (10 mg total) by mouth 2 (two) times daily. 06/10/23   Shamleffer, Konrad Dolores, MD  methocarbamol (ROBAXIN) 500 MG tablet TAKE 1 TABLET BY MOUTH EVERY 8 HOURS AS NEEDED FOR MUSCLE SPASM 05/27/23   Cammy Copa, MD  Omega 3 1000 MG CAPS 1 capsule    [provider]  omega-3 acid ethyl esters (LOVAZA) 1 G capsule Take 1 g by mouth daily.    [provider]  ondansetron (ZOFRAN-ODT) 4 MG disintegrating tablet Take 1 tablet (4 mg total) by mouth every 8 (eight) hours as needed for nausea or vomiting. 03/25/23   Zenia Resides, MD  rosuvastatin (CRESTOR) 10 MG tablet Take 10 mg by mouth at bedtime. 09/06/21   [provider]  valACYclovir (VALTREX) 500 MG tablet 1 tablet for 3 days then stop, use as needed Orally twice a day for 90 days 03/16/11   [provider]  Allergies    Other, Nsaids, Peanut oil, Peanut-containing drug products, and Prednisone    Review of Systems   Review of Systems  Musculoskeletal:  Positive for back pain.    Physical Exam Updated Vital Signs BP (!) 150/92   Pulse 84   Temp 97.7 F (36.5 C) (Oral)   Resp 18   SpO2 99%  Physical Exam Vitals and nursing note reviewed.  HENT:     Head: Normocephalic and atraumatic.  Eyes:     Conjunctiva/sclera: Conjunctivae normal.  Cardiovascular:     Rate and Rhythm: Normal rate.  Pulmonary:     Effort: Pulmonary effort is normal. No respiratory distress.  Musculoskeletal:        General: No tenderness or signs of injury.     Cervical back: Normal  range of motion.  Skin:    General: Skin is dry.  Neurological:     General: No focal deficit present.     Mental Status: He is alert.     Motor: No weakness.  Psychiatric:        Speech: Speech normal.        Behavior: Behavior normal.     ED Results / Procedures / Treatments   Labs (all labs ordered are listed, but only abnormal results are displayed) Labs Reviewed - No data to display  EKG None  Radiology No results found.  Procedures Procedures    Medications Ordered in ED Medications  ketorolac (TORADOL) 15 MG/ML injection 15 mg (15 mg Intramuscular Given 06/13/23 0127)  HYDROcodone-acetaminophen (NORCO/VICODIN) 5-325 MG per tablet 1 tablet (1 tablet Oral Given 06/13/23 0127)    ED Course/ Medical Decision Making/ A&P                                 Medical Decision Making Risk Prescription drug management.   This patient presents to the ED for concern of back pain, this involves an extensive number of treatment options, and is a complaint that carries with it a high risk of complications and morbidity.  The differential diagnosis includes fracture, dislocation, soft tissue injury, cauda equina, others   Co morbidities that complicate the patient evaluation  Known lumbar degenerative disc disease, hypertension   Additional history obtained:  Additional history obtained from EMS External records from outside source obtained and reviewed including sports medicine notes    Imaging Studies ordered:  I considered spinal imaging but there are no red flag symptoms to necessitate imaging at this time.   Problem List / ED Course / Critical interventions / Medication management   I ordered medication including Toradol and Norco for pain and inflammation Reevaluation of the patient after these medicines showed that the patient improved I have reviewed the patients home medicines and have made adjustments as needed   Test / Admission -  Considered:  Patient with apparent flare of his underlying degenerative disc disease.  Patient with planned surgery coming up next month.  I see no indication at this time for emergent imaging.  No symptoms concerning for cauda equina.  No trauma.  Patient feeling better after medications.  I did consider a steroid injection but was concerned that this may potentially delay his surgery and recommend that he follow-up with Dr. Shon Baton tomorrow for further recommendations for pain management in the interim.  Patient voices understanding with plan.  Return precautions provided.         Final Clinical Impression(s) /  ED Diagnoses Final diagnoses:  Bilateral low back pain without sciatica, unspecified chronicity    Rx / DC Orders ED Discharge Orders     None         Pamala Duffel 06/13/23 Harlon Ditty, MD 06/13/23 [phone]

## 2023-06-13 NOTE — Discharge Instructions (Signed)
Please continue to take your home medications as prescribed and follow-up with your neurosurgeon for further recommendations as needed.  If you develop any life-threatening symptoms return to the emergency department.

## 2023-06-14 DIAGNOSIS — Z961 Presence of intraocular lens: Secondary | ICD-10-CM | POA: Diagnosis not present

## 2023-06-14 DIAGNOSIS — Z9841 Cataract extraction status, right eye: Secondary | ICD-10-CM | POA: Diagnosis not present

## 2023-06-14 DIAGNOSIS — E119 Type 2 diabetes mellitus without complications: Secondary | ICD-10-CM | POA: Diagnosis not present

## 2023-06-14 DIAGNOSIS — H00014 Hordeolum externum left upper eyelid: Secondary | ICD-10-CM | POA: Diagnosis not present

## 2023-06-19 ENCOUNTER — Other Ambulatory Visit: Payer: Self-pay

## 2023-07-01 NOTE — Pre-Procedure Instructions (Signed)
Surgical Instructions   Your procedure is scheduled on July 08, 2023. Report to [facility] Main Entrance "A" at 5:30 A.M., then check in with the Admitting office. Any questions or running late day of surgery: call [phone]  Questions prior to your surgery date: call [phone], Monday-Friday, 8am-4pm. If you experience any cold or flu symptoms such as cough, fever, chills, shortness of breath, etc. between now and your scheduled surgery, please notify us at the above number.     Remember:  Do not eat after midnight the night before your surgery  You may drink clear liquids until 4:30 AM the morning of your surgery.   Clear liquids allowed are: Water, Non-Citrus Juices (without pulp), Carbonated Beverages, Clear Tea (no milk, honey, etc.), Black Coffee Only (NO MILK, CREAM OR POWDERED CREAMER of any kind), and Gatorade.    Take these medicines the morning of surgery with A SIP OF WATER: amLODipine (NORVASC)  cyclobenzaprine (FLEXERIL)  methimazole (TAPAZOLE)  methocarbamol (ROBAXIN)    May take these medicines IF NEEDED: ondansetron (ZOFRAN-ODT)  valACYclovir (VALTREX)    One week prior to surgery, STOP taking any Aspirin (unless otherwise instructed by your surgeon) Aleve, Naproxen, Ibuprofen, Motrin, Advil, Goody's, BC's, all herbal medications, fish oil, and non-prescription vitamins. This includes your medication: meloxicam (MOBIC)    WHAT DO I DO ABOUT MY DIABETES MEDICATION?   Do not take metFORMIN (GLUCOPHAGE) the morning of surgery.   HOW TO MANAGE YOUR DIABETES BEFORE AND AFTER SURGERY  Why is it important to control my blood sugar before and after surgery? Improving blood sugar levels before and after surgery helps healing and can limit problems. A way of improving blood sugar control is eating a healthy diet by:  Eating less sugar and carbohydrates  Increasing activity/exercise  Talking with your doctor about reaching your blood sugar goals High  blood sugars (greater than 180 mg/dL) can raise your risk of infections and slow your recovery, so you will need to focus on controlling your diabetes during the weeks before surgery. Make sure that the doctor who takes care of your diabetes knows about your planned surgery including the date and location.  How do I manage my blood sugar before surgery? Check your blood sugar at least 4 times a day, starting 2 days before surgery, to make sure that the level is not too high or low.  Check your blood sugar the morning of your surgery when you wake up and every 2 hours until you get to the Short Stay unit.  If your blood sugar is less than 70 mg/dL, you will need to treat for low blood sugar: Do not take insulin. Treat a low blood sugar (less than 70 mg/dL) with  cup of clear juice (cranberry or apple), 4 glucose tablets, OR glucose gel. Recheck blood sugar in 15 minutes after treatment (to make sure it is greater than 70 mg/dL). If your blood sugar is not greater than 70 mg/dL on recheck, call [phone] for further instructions. Report your blood sugar to the short stay nurse when you get to Short Stay.  If you are admitted to the hospital after surgery: Your blood sugar will be checked by the staff and you will probably be given insulin after surgery (instead of oral diabetes medicines) to make sure you have good blood sugar levels. The goal for blood sugar control after surgery is 80-180 mg/dL.  Do NOT Smoke (Tobacco/Vaping) for 24 hours prior to your procedure.  If you use a CPAP at night, you may bring your mask/headgear for your overnight stay.   You will be asked to remove any contacts, glasses, piercing's, hearing aid's, dentures/partials prior to surgery. Please bring cases for these items if needed.    Patients discharged the day of surgery will not be allowed to drive home, and someone needs to stay with them for 24 hours.  SURGICAL WAITING ROOM  VISITATION Patients may have no more than 2 support people in the waiting area - these visitors may rotate.   Pre-op nurse will coordinate an appropriate time for 1 ADULT support person, who may not rotate, to accompany patient in pre-op.  Children under the age of 6 must have an adult with them who is not the patient and must remain in the main waiting area with an adult.  If the patient needs to stay at the hospital during part of their recovery, the visitor guidelines for inpatient rooms apply.  Please refer to the [facility] website for the visitor guidelines for any additional information.   If you received a COVID test during your pre-op visit  it is requested that you wear a mask when out in public, stay away from anyone that may not be feeling well and notify your surgeon if you develop symptoms. If you have been in contact with anyone that has tested positive in the last 10 days please notify you surgeon.      Pre-operative 5 CHG Bathing Instructions   You can play a key role in reducing the risk of infection after surgery. Your skin needs to be as free of germs as possible. You can reduce the number of germs on your skin by washing with CHG (chlorhexidine gluconate) soap before surgery. CHG is an antiseptic soap that kills germs and continues to kill germs even after washing.   DO NOT use if you have an allergy to chlorhexidine/CHG or antibacterial soaps. If your skin becomes reddened or irritated, stop using the CHG and notify one of our RNs at [phone].   Please shower with the CHG soap starting 4 days before surgery using the following schedule:     Please keep in mind the following:  DO NOT shave, including legs and underarms, starting the day of your first shower.   You may shave your face at any point before/day of surgery.  Place clean sheets on your bed the day you start using CHG soap. Use a clean washcloth (not used since being washed) for each shower. DO NOT  sleep with pets once you start using the CHG.   CHG Shower Instructions:  Wash your face and private area with normal soap. If you choose to wash your hair, wash first with your normal shampoo.  After you use shampoo/soap, rinse your hair and body thoroughly to remove shampoo/soap residue.  Turn the water OFF and apply about 3 tablespoons (45 ml) of CHG soap to a CLEAN washcloth.  Apply CHG soap ONLY FROM YOUR NECK DOWN TO YOUR TOES (washing for 3-5 minutes)  DO NOT use CHG soap on face, private areas, open wounds, or sores.  Pay special attention to the area where your surgery is being performed.  If you are having back surgery, having someone wash your back for you may be helpful. Wait 2 minutes after CHG soap is applied, then you may rinse off the CHG soap.  Pat dry with a  clean towel  Put on clean clothes/pajamas   If you choose to wear lotion, please use ONLY the CHG-compatible lotions on the back of this paper.   Additional instructions for the day of surgery: DO NOT APPLY any lotions, deodorants, cologne, or perfumes.   Do not bring valuables to the hospital. [facility] is not responsible for any belongings/valuables. Do not wear nail polish, gel polish, artificial nails, or any other type of covering on natural nails (fingers and toes) Do not wear jewelry or makeup Put on clean/comfortable clothes.  Please brush your teeth.  Ask your nurse before applying any prescription medications to the skin.     CHG Compatible Lotions   Aveeno Moisturizing lotion  Cetaphil Moisturizing Cream  Cetaphil Moisturizing Lotion  Clairol Herbal Essence Moisturizing Lotion, Dry Skin  Clairol Herbal Essence Moisturizing Lotion, Extra Dry Skin  Clairol Herbal Essence Moisturizing Lotion, Normal Skin  Curel Age Defying Therapeutic Moisturizing Lotion with Alpha Hydroxy  Curel Extreme Care Body Lotion  Curel Soothing Hands Moisturizing Hand Lotion  Curel Therapeutic Moisturizing Cream,  Fragrance-Free  Curel Therapeutic Moisturizing Lotion, Fragrance-Free  Curel Therapeutic Moisturizing Lotion, Original Formula  Eucerin Daily Replenishing Lotion  Eucerin Dry Skin Therapy Plus Alpha Hydroxy Crme  Eucerin Dry Skin Therapy Plus Alpha Hydroxy Lotion  Eucerin Original Crme  Eucerin Original Lotion  Eucerin Plus Crme Eucerin Plus Lotion  Eucerin TriLipid Replenishing Lotion  Keri Anti-Bacterial Hand Lotion  Keri Deep Conditioning Original Lotion Dry Skin Formula Softly Scented  Keri Deep Conditioning Original Lotion, Fragrance Free Sensitive Skin Formula  Keri Lotion Fast Absorbing Fragrance Free Sensitive Skin Formula  Keri Lotion Fast Absorbing Softly Scented Dry Skin Formula  Keri Original Lotion  Keri Skin Renewal Lotion Keri Silky Smooth Lotion  Keri Silky Smooth Sensitive Skin Lotion  Nivea Body Creamy Conditioning Oil  Nivea Body Extra Enriched Lotion  Nivea Body Original Lotion  Nivea Body Sheer Moisturizing Lotion Nivea Crme  Nivea Skin Firming Lotion  NutraDerm 30 Skin Lotion  NutraDerm Skin Lotion  NutraDerm Therapeutic Skin Cream  NutraDerm Therapeutic Skin Lotion  ProShield Protective Hand Cream  Provon moisturizing lotion  Please read over the following fact sheets that you were given.

## 2023-07-02 ENCOUNTER — Emergency Department (HOSPITAL_COMMUNITY)
Admission: EM | Admit: 2023-07-02 | Discharge: 2023-07-02 | Disposition: A | Discharge status: Home / Self Care | Payer: 59 | Attending: Emergency Medicine | Admitting: Emergency Medicine

## 2023-07-02 ENCOUNTER — Encounter (HOSPITAL_COMMUNITY): Payer: Self-pay

## 2023-07-02 ENCOUNTER — Encounter (HOSPITAL_COMMUNITY)
Admission: RE | Admit: 2023-07-02 | Discharge: 2023-07-02 | Disposition: A | Discharge status: Home / Self Care | Payer: 59 | Source: Ambulatory Visit | Attending: Orthopedic Surgery | Admitting: Orthopedic Surgery

## 2023-07-02 ENCOUNTER — Other Ambulatory Visit (HOSPITAL_COMMUNITY): Payer: 59

## 2023-07-02 ENCOUNTER — Other Ambulatory Visit: Payer: Self-pay

## 2023-07-02 VITALS — BP 141/83 | HR 88 | Temp 98.0°F | Ht 74.0 in | Wt 268.8 lb

## 2023-07-02 DIAGNOSIS — R000 Tachycardia, unspecified: Secondary | ICD-10-CM | POA: Diagnosis not present

## 2023-07-02 DIAGNOSIS — E669 Obesity, unspecified: Secondary | ICD-10-CM | POA: Insufficient documentation

## 2023-07-02 DIAGNOSIS — M545 Low back pain, unspecified: Secondary | ICD-10-CM | POA: Diagnosis not present

## 2023-07-02 DIAGNOSIS — Z7984 Long term (current) use of oral hypoglycemic drugs: Secondary | ICD-10-CM | POA: Insufficient documentation

## 2023-07-02 DIAGNOSIS — G8929 Other chronic pain: Secondary | ICD-10-CM | POA: Insufficient documentation

## 2023-07-02 DIAGNOSIS — I10 Essential (primary) hypertension: Secondary | ICD-10-CM | POA: Insufficient documentation

## 2023-07-02 DIAGNOSIS — E119 Type 2 diabetes mellitus without complications: Secondary | ICD-10-CM | POA: Insufficient documentation

## 2023-07-02 DIAGNOSIS — K760 Fatty (change of) liver, not elsewhere classified: Secondary | ICD-10-CM | POA: Insufficient documentation

## 2023-07-02 DIAGNOSIS — Z6834 Body mass index (BMI) 34.0-34.9, adult: Secondary | ICD-10-CM | POA: Insufficient documentation

## 2023-07-02 DIAGNOSIS — K769 Liver disease, unspecified: Secondary | ICD-10-CM

## 2023-07-02 DIAGNOSIS — M51379 Other intervertebral disc degeneration, lumbosacral region without mention of lumbar back pain or lower extremity pain: Secondary | ICD-10-CM | POA: Insufficient documentation

## 2023-07-02 DIAGNOSIS — Z01818 Encounter for other preprocedural examination: Secondary | ICD-10-CM | POA: Diagnosis present

## 2023-07-02 DIAGNOSIS — M5459 Other low back pain: Secondary | ICD-10-CM | POA: Diagnosis not present

## 2023-07-02 DIAGNOSIS — E0590 Thyrotoxicosis, unspecified without thyrotoxic crisis or storm: Secondary | ICD-10-CM | POA: Insufficient documentation

## 2023-07-02 HISTORY — DX: Thyrotoxicosis, unspecified without thyrotoxic crisis or storm: E05.90

## 2023-07-02 HISTORY — DX: Gastro-esophageal reflux disease without esophagitis: K21.9

## 2023-07-02 LAB — COMPREHENSIVE METABOLIC PANEL
ALT: 16 U/L (ref 0–44)
AST: 18 U/L (ref 15–41)
Albumin: 3.7 g/dL (ref 3.5–5.0)
Alkaline Phosphatase: 165 U/L — ABNORMAL HIGH (ref 38–126)
Anion gap: 7 (ref 5–15)
BUN: 9 mg/dL (ref 8–23)
CO2: 30 mmol/L (ref 22–32)
Calcium: 9.2 mg/dL (ref 8.9–10.3)
Chloride: 100 mmol/L (ref 98–111)
Creatinine, Ser: 1.3 mg/dL — ABNORMAL HIGH (ref 0.61–1.24)
GFR, Estimated: >60 mL/min (ref >60)
Glucose, Bld: 149 mg/dL — ABNORMAL HIGH (ref 70–99)
Potassium: 3.5 mmol/L (ref 3.5–5.1)
Sodium: 137 mmol/L (ref 135–145)
Total Bilirubin: 0.8 mg/dL (ref <1.2)
Total Protein: 7.5 g/dL (ref 6.5–8.1)

## 2023-07-02 LAB — CBC
HCT: 42.8 % (ref 39.0–52.0)
Hemoglobin: 13.9 g/dL (ref 13.0–17.0)
MCH: 26.7 pg (ref 26.0–34.0)
MCHC: 32.5 g/dL (ref 30.0–36.0)
MCV: 82.3 fL (ref 80.0–100.0)
Platelets: 325 10*3/uL (ref 150–400)
RBC: 5.2 MIL/uL (ref 4.22–5.81)
RDW: 13.5 % (ref 11.5–15.5)
WBC: 6.7 10*3/uL (ref 4.0–10.5)
nRBC: 0 % (ref 0.0–0.2)

## 2023-07-02 LAB — HEMOGLOBIN A1C
Hgb A1c MFr Bld: 7.2 % — ABNORMAL HIGH (ref 4.8–5.6)
Mean Plasma Glucose: 159.94 mg/dL

## 2023-07-02 LAB — TYPE AND SCREEN
ABO/RH(D): B POS
Antibody Screen: NEGATIVE

## 2023-07-02 LAB — GLUCOSE, CAPILLARY: Glucose-Capillary: 144 mg/dL — ABNORMAL HIGH (ref 70–99)

## 2023-07-02 LAB — SURGICAL PCR SCREEN
MRSA, PCR: NEGATIVE
Staphylococcus aureus: NEGATIVE

## 2023-07-02 MED ORDER — OXYCODONE HCL 5 MG PO TABS
10.0000 mg | ORAL_TABLET | Freq: Once | ORAL | Status: AC
  Administered 2023-07-02: 10 mg via ORAL
  Filled 2023-07-02: qty 2

## 2023-07-02 MED ORDER — OXYCODONE HCL 5 MG PO TABS: 5.0000 mg | ORAL_TABLET | Freq: Four times a day (QID) | ORAL | 0 refills | Status: DC | PRN

## 2023-07-02 NOTE — Progress Notes (Signed)
PCP - Dr. Eleanora Neighbor Cardiologist - Denies Endocrinologist - Dr. Konrad Dolores [facility]   PPM/ICD - Denies Device Orders - n/a Rep Notified - n/a  Chest x-ray - n/a EKG - 07/02/2023 Stress Test - Denies ECHO - Denies Cardiac Cath - Denies  Sleep Study - Denies CPAP - n/a  Pt is DM2. He only checks his blood sugar when he feels symptomatic. Unknown normal fasting range. CBG at pre-op 144 and he had 5 grapes this morning to take medication. A1c result pending.  Last dose of GLP1 agonist- n/a GLP1 instructions: n/a  Blood Thinner Instructions: n/a Aspirin Instructions: n/a  ERAS Protcol - Clear liquids until 0430 morning of surgery PRE-SURGERY Ensure or G2- n/a  COVID TEST- n/a   Anesthesia review: Yes. Borderline EKG review.   Patient denies shortness of breath, fever, cough and chest pain at PAT appointment. Pt denies any respiratory illness/infection in the last two months.    All instructions explained to the patient, with a verbal understanding of the material. Patient agrees to go over the instructions while at home for a better understanding. Patient also instructed to self quarantine after being tested for COVID-19. The opportunity to ask questions was provided.

## 2023-07-02 NOTE — ED Triage Notes (Signed)
BIBA from home c/o lower back pain and spasms. X1 year. Seen for same on 06/12/23  Flexeril and robaxin w/o relief.  fusion scheduled for Monday.

## 2023-07-02 NOTE — ED Provider Notes (Signed)
[facility] AT [facility] Provider Note   CSN: [id number] Arrival date & time: 07/02/23  1642     History Chief Complaint  Patient presents with   Back Pain    HPI Jeffery [name] is a 62 y.o. male presenting for acute on chronic back pain.  Longstanding history of musculoskeletal back pain.  Denies any urinary incontinence, neurologic symptoms fevers chills nausea vomiting.  States he scheduled for fusion next week due to ongoing back pain has been using NSAIDs, Tylenol, muscle relaxers without much improvement..   Patient's recorded medical, surgical, social, medication list and allergies were reviewed in the Snapshot window as part of the initial history.   Review of Systems   Review of Systems  Constitutional:  Negative for chills and fever.  HENT:  Negative for ear pain and sore throat.   Eyes:  Negative for pain and visual disturbance.  Respiratory:  Negative for cough and shortness of breath.   Cardiovascular:  Negative for chest pain and palpitations.  Gastrointestinal:  Negative for abdominal pain and vomiting.  Genitourinary:  Negative for dysuria and hematuria.  Musculoskeletal:  Positive for back pain. Negative for arthralgias.  Skin:  Negative for color change and rash.  Neurological:  Negative for seizures and syncope.  All other systems reviewed and are negative.   Physical Exam Updated Vital Signs BP (!) 146/90   Pulse 90   Temp 98 F (36.7 C) (Oral)   Resp 20   SpO2 99%  Physical Exam Vitals and nursing note reviewed.  Constitutional:      General: He is not in acute distress.    Appearance: He is well-developed.  HENT:     Head: Normocephalic and atraumatic.  Eyes:     Conjunctiva/sclera: Conjunctivae normal.  Cardiovascular:     Rate and Rhythm: Normal rate and regular rhythm.     Heart sounds: No murmur heard. Pulmonary:     Effort: Pulmonary effort is normal. No respiratory distress.     Breath sounds:  Normal breath sounds.  Abdominal:     Palpations: Abdomen is soft.     Tenderness: There is no abdominal tenderness.  Musculoskeletal:        General: No swelling.     Cervical back: Neck supple.  Skin:    General: Skin is warm and dry.     Capillary Refill: Capillary refill takes less than 2 seconds.  Neurological:     Mental Status: He is alert.  Psychiatric:        Mood and Affect: Mood normal.      ED Course/ Medical Decision Making/ A&P    Procedures Procedures   Medications Ordered in ED Medications  oxyCODONE (Oxy IR/ROXICODONE) immediate release tablet 10 mg (10 mg Oral Given 07/02/23 2119)   Medical Decision Making:   Jeffery [name] is a 62 y.o. male who presented to the ED today with acute lower back pain over the past 72 hours, detailed above.    Patient placed on continuous vitals and telemetry monitoring while in ED which was reviewed periodically.   On my initial exam, the pt was with an intact neurologic exam, tolerating ambulation with an antalgic gait and p.o. intake without difficulty.  Patient had no abnormal DTRs, no midline spinal tenderness.  Patient endorsing complete sensation of the perineum.  Patient without episodes of fecal or urinary incontinence.  Patient has no focal neurologic deficits and reassuring vital signs at this time.  No obvious  physical abnormality or injury on exam. Notably, patient denies recent trauma, is afebrile, and denies IVDU.   Reviewed and confirmed nursing documentation for past medical history, family history, social history.    Initial Assessment:   With the patient's presentation of acute back pain in the above setting, most likely diagnosis is musculoskeletal strain. Other diagnoses were considered including (but not limited to) underlying fracture, epidural hematoma, cauda equina syndrome, spinal stenosis, spinal malignancy. These are considered less likely due to history of present illness and physical exam findings.    In particular, lack of fever, substantial history of IV drug use, or substantial neurologic abnormality is less consistent with epidural abscess versus discitis or other spinal infection. In particular,  Initial Plan:  Multimodal pain control described and patient informed on safe usage.  Screening evaluation including below radiographic evaluation reviewed and grossly unremarkable at this time. Patient stable for continued outpatient evaluation and management of their musculoskeletal pains.  Patient referred back to primary care provider for continued evaluation and management.   Initial Study Results:   Radiology  No orders to display     Disposition:   Based on the above findings, I believe patient is stable for discharge.    Patient and family educated about specific return precautions for given chief complaint and symptoms.  Patient and family educated about follow-up with PCP.  Patient and family expressed understanding of return precautions and need for follow-up. Patient spoken to regarding all imaging and laboratory results and appropriate follow up for these results. All education provided in verbal and written form and time was allowed for answering of patient questions. Patient discharged.          Emergency Department Medication Summary:   Medications  oxyCODONE (Oxy IR/ROXICODONE) immediate release tablet 10 mg (10 mg Oral Given 07/02/23 2119)      Clinical Impression:  1. Acute low back pain, unspecified back pain laterality, unspecified whether sciatica present      Discharge   Final Clinical Impression(s) / ED Diagnoses Final diagnoses:  Acute low back pain, unspecified back pain laterality, unspecified whether sciatica present    Rx / DC Orders ED Discharge Orders          Ordered    oxyCODONE (ROXICODONE) 5 MG immediate release tablet  Every 6 hours PRN        07/02/23 2154              Glyn Ade, MD 07/02/23 2218

## 2023-07-02 NOTE — ED Provider Triage Note (Signed)
Emergency Medicine Provider Triage Evaluation Note  Jeffery [name] , a 62 y.o. male  was evaluated in triage.  Pt complains of back spasm.  Review of Systems  Positive: Down right leg, herniated disc Negative: Incontinence, fever, chills, abd pain, HA, SOB CP  Physical Exam  BP (!) 143/95 (BP Location: Left Arm)   Pulse (!) 113   Temp 98 F (36.7 C)   Resp 18   SpO2 100%  Gen:   Awake, no distress   Resp:  Normal effort  MSK:   Moves extremities without difficulty  Other:    Medical Decision Making  Medically screening exam initiated at 6:16 PM.  Appropriate orders placed.  Jeffery [name] was informed that the remainder of the evaluation will be completed by another provider, this initial triage assessment does not replace that evaluation, and the importance of remaining in the ED until their evaluation is complete.     Dolphus Jenny, PA-C 07/02/23 [phone]

## 2023-07-03 NOTE — Progress Notes (Signed)
Anesthesia Chart Review:  Case: [id number] Date/Time: 07/08/23 0715   Procedures:      Anterior lumbar interbody fusion L5-S1 with posterior supplemental fusion -     POSTERIOR LUMBAR FUSION 1 LEVEL     ABDOMINAL EXPOSURE   Anesthesia type: General   Pre-op diagnosis: degenerative disc disease with radicular left pain L5-S1   Location: [facility] ROOM 04 / [facility] OR   Surgeons: Venita Lick, MD; Cephus Shelling, MD       DISCUSSION: Patient is a 62 year old male scheduled for the above procedure. Several ED visits over the past 3 months for back pain.   History includes never smoker, HTN, fatty liver, DM2, GERD, hyperthyroidism (most consistent with Graves' disease, on Tapazole 2019-2021, resumed 07/2021), spinal surgery (C5-6 ACDF 08/02/05), cataract (s/p right cataract extraction 05/30/23, s/p left YAG laser capsulotomy 10/24/22). BMI is consistent with obesity.   Last endocrinologist visit was on 06/10/23. Dr. Lonzo Cloud noted surgery plans. Patient clinically euthyroid. TSH increased but still low at 0.12. Normal Free T4 at 0.80 and Free T3 at 3.6. Methimazole increased to 10 mg BID. A1c 7.2%, on metformin 500 mg daily.   Anesthesia team to evaluate on the day of surgery.    VS: BP (!) 141/83   Pulse 88   Temp 36.7 C (Oral)   Ht 6\' 2"  (1.88 m)   Wt 121.9 kg   SpO2 100%   BMI 34.51 kg/m    PROVIDERS: Emilio Aspen, MD is PCP  Lonzo Cloud, Brent Bulla, MD is endocrinologist   LABS: Preoperative labs noted. A1c 7.2%. Creatinine 1.3, previously ~ 1.08-1.16 since April 2023 in [facility].  (all labs ordered are listed, but only abnormal results are displayed)  Labs Reviewed  GLUCOSE, CAPILLARY - Abnormal; Notable for the following components:      Result Value   Glucose-Capillary 144 (*)    All other components within normal limits  HEMOGLOBIN A1C - Abnormal; Notable for the following components:   Hgb A1c MFr Bld 7.2 (*)    All other components within normal limits   COMPREHENSIVE METABOLIC PANEL - Abnormal; Notable for the following components:   Glucose, Bld 149 (*)    Creatinine, Ser 1.30 (*)    Alkaline Phosphatase 165 (*)    All other components within normal limits  SURGICAL PCR SCREEN  CBC  TYPE AND SCREEN    IMAGES: MRI L-spine 12/20/22: IMPRESSION: 1. Mild multilevel lumbar spondylosis, slightly improved from the prior study dated 06/18/2022. 2. Unchanged mild spinal canal stenosis at L3-4. 3. No high-grade neural foraminal narrowing.   EKG: 07/02/23: Normal sinus rhythm Incomplete right bundle branch block Borderline ECG Confirmed by Lennie Odor [phone]) on 07/02/2023 7:44:47 PM - He had an incomplete RBBB also on EKG tracing in [facility] dated 07/30/05.   CV: N/A  Past Medical History:  Diagnosis Date   Anal fissure    Diabetes mellitus without complication (HCC)    Type 2   Fatty liver    GERD (gastroesophageal reflux disease)    Hyperplastic rectal polyp    Hypertension    Hyperthyroidism    Lipoma    Lumbar degenerative disc disease     Past Surgical History:  Procedure Laterality Date   BUNIONECTOMY Left    CATARACT EXTRACTION W/ INTRAOCULAR LENS IMPLANT Bilateral    Right - 05/2023, Left - 2021   CERVICAL DISC ARTHROPLASTY  07/23/2005   C-5 and C-6   COLONOSCOPY     SHOULDER SURGERY  2008  and 2012   left in 2008, right had rtc tear 2012    MEDICATIONS:  amLODipine (NORVASC) 5 MG tablet   colchicine 0.6 MG tablet   cyclobenzaprine (FLEXERIL) 10 MG tablet   diazepam (VALIUM) 5 MG tablet   ELDERBERRY PO   hydrochlorothiazide (HYDRODIURIL) 25 MG tablet   losartan (COZAAR) 100 MG tablet   meloxicam (MOBIC) 15 MG tablet   metFORMIN (GLUCOPHAGE) 500 MG tablet   methimazole (TAPAZOLE) 10 MG tablet   methocarbamol (ROBAXIN) 500 MG tablet   Omega 3 1000 MG CAPS   ondansetron (ZOFRAN-ODT) 4 MG disintegrating tablet   oxyCODONE (ROXICODONE) 5 MG immediate release tablet   rosuvastatin (CRESTOR) 10 MG tablet    valACYclovir (VALTREX) 500 MG tablet   No current facility-administered medications for this encounter.    Shonna Chock, PA-C Surgical Short Stay/Anesthesiology [facility] Phone [phone] [facility] Phone [phone] 07/03/2023 12:57 PM]

## 2023-07-03 NOTE — Anesthesia Preprocedure Evaluation (Addendum)
Anesthesia Evaluation  Patient identified by MRN, date of birth, ID band Patient awake    Reviewed: Allergy & Precautions, NPO status , Patient's Chart, lab work & pertinent test results  Airway Mallampati: II  TM Distance: >3 FB Neck ROM: Full    Dental no notable dental hx. (+) Teeth Intact, Dental Advisory Given   Pulmonary neg pulmonary ROS   Pulmonary exam normal breath sounds clear to auscultation       Cardiovascular hypertension, Pt. on medications Normal cardiovascular exam Rhythm:Regular Rate:Normal  EKG: 07/02/23: Normal sinus rhythm Incomplete right bundle branch block Borderline ECG    Neuro/Psych negative neurological ROS  negative psych ROS   GI/Hepatic ,GERD  ,,(+)     substance abuse    Endo/Other  diabetes, Type 2, Oral Hypoglycemic Agents Hyperthyroidism   Renal/GU negative Renal ROS  negative genitourinary   Musculoskeletal  (+) Arthritis ,  narcotic dependent  Abdominal   Peds  Hematology negative hematology ROS (+)   Anesthesia Other Findings History includes never smoker, HTN, fatty liver, DM2, GERD, hyperthyroidism (most consistent with Graves' disease, on Tapazole 2019-2021, resumed 07/2021), spinal surgery (C5-6 ACDF 08/02/05), cataract (s/p right cataract extraction 05/30/23, s/p left YAG laser capsulotomy 10/24/22). BMI is consistent with obesity  Reproductive/Obstetrics                             Anesthesia Physical Anesthesia Plan  ASA: 3  Anesthesia Plan: General and Regional   Post-op Pain Management: Regional block*, Tylenol PO (pre-op)* and Ketamine IV*   Induction: Intravenous  PONV Risk Score and Plan: 2 and Midazolam and Ondansetron  Airway Management Planned: Oral ETT and Video Laryngoscope Planned  Additional Equipment:   Intra-op Plan:   Post-operative Plan: Extubation in OR  Informed Consent: I have reviewed the patients History and  Physical, chart, labs and discussed the procedure including the risks, benefits and alternatives for the proposed anesthesia with the patient or authorized representative who has indicated his/her understanding and acceptance.     Dental advisory given  Plan Discussed with: CRNA  Anesthesia Plan Comments: (2 IVs  )       Anesthesia Quick Evaluation

## 2023-07-08 ENCOUNTER — Inpatient Hospital Stay (HOSPITAL_COMMUNITY): Payer: 59

## 2023-07-08 ENCOUNTER — Inpatient Hospital Stay (HOSPITAL_COMMUNITY): Payer: 59 | Admitting: Vascular Surgery

## 2023-07-08 ENCOUNTER — Inpatient Hospital Stay (HOSPITAL_COMMUNITY): Payer: 59 | Admitting: Anesthesiology

## 2023-07-08 ENCOUNTER — Other Ambulatory Visit: Payer: Self-pay

## 2023-07-08 ENCOUNTER — Inpatient Hospital Stay (HOSPITAL_COMMUNITY)
Admission: RE | Disposition: A | Discharge status: Home / Self Care | Payer: Self-pay | Source: Home / Self Care | Attending: Orthopedic Surgery

## 2023-07-08 ENCOUNTER — Inpatient Hospital Stay (HOSPITAL_COMMUNITY)
Admission: RE | Admit: 2023-07-08 | Discharge: 2023-07-12 | DRG: 402 | Disposition: A | Discharge status: Home / Self Care | Payer: 59 | Attending: Orthopedic Surgery | Admitting: Orthopedic Surgery

## 2023-07-08 ENCOUNTER — Encounter (HOSPITAL_COMMUNITY): Payer: Self-pay | Admitting: Orthopedic Surgery

## 2023-07-08 DIAGNOSIS — Z981 Arthrodesis status: Secondary | ICD-10-CM | POA: Diagnosis not present

## 2023-07-08 DIAGNOSIS — M5116 Intervertebral disc disorders with radiculopathy, lumbar region: Secondary | ICD-10-CM | POA: Diagnosis not present

## 2023-07-08 DIAGNOSIS — G8929 Other chronic pain: Secondary | ICD-10-CM | POA: Diagnosis not present

## 2023-07-08 DIAGNOSIS — M7989 Other specified soft tissue disorders: Secondary | ICD-10-CM | POA: Diagnosis not present

## 2023-07-08 DIAGNOSIS — G8918 Other acute postprocedural pain: Secondary | ICD-10-CM | POA: Diagnosis not present

## 2023-07-08 DIAGNOSIS — Z7985 Long-term (current) use of injectable non-insulin antidiabetic drugs: Secondary | ICD-10-CM | POA: Diagnosis not present

## 2023-07-08 DIAGNOSIS — Z79899 Other long term (current) drug therapy: Secondary | ICD-10-CM | POA: Diagnosis not present

## 2023-07-08 DIAGNOSIS — Z886 Allergy status to analgesic agent status: Secondary | ICD-10-CM | POA: Diagnosis not present

## 2023-07-08 DIAGNOSIS — Z91010 Allergy to peanuts: Secondary | ICD-10-CM

## 2023-07-08 DIAGNOSIS — I878 Other specified disorders of veins: Secondary | ICD-10-CM | POA: Diagnosis not present

## 2023-07-08 DIAGNOSIS — M51361 Other intervertebral disc degeneration, lumbar region with lower extremity pain only: Secondary | ICD-10-CM

## 2023-07-08 DIAGNOSIS — M5117 Intervertebral disc disorders with radiculopathy, lumbosacral region: Secondary | ICD-10-CM | POA: Diagnosis not present

## 2023-07-08 DIAGNOSIS — M48061 Spinal stenosis, lumbar region without neurogenic claudication: Secondary | ICD-10-CM | POA: Diagnosis not present

## 2023-07-08 DIAGNOSIS — M4807 Spinal stenosis, lumbosacral region: Secondary | ICD-10-CM | POA: Diagnosis present

## 2023-07-08 DIAGNOSIS — Z888 Allergy status to other drugs, medicaments and biological substances status: Secondary | ICD-10-CM | POA: Diagnosis not present

## 2023-07-08 DIAGNOSIS — M2578 Osteophyte, vertebrae: Secondary | ICD-10-CM | POA: Diagnosis present

## 2023-07-08 DIAGNOSIS — M5417 Radiculopathy, lumbosacral region: Secondary | ICD-10-CM | POA: Diagnosis not present

## 2023-07-08 DIAGNOSIS — K760 Fatty (change of) liver, not elsewhere classified: Secondary | ICD-10-CM | POA: Diagnosis present

## 2023-07-08 DIAGNOSIS — K219 Gastro-esophageal reflux disease without esophagitis: Secondary | ICD-10-CM | POA: Diagnosis not present

## 2023-07-08 DIAGNOSIS — I10 Essential (primary) hypertension: Secondary | ICD-10-CM | POA: Diagnosis present

## 2023-07-08 DIAGNOSIS — K5900 Constipation, unspecified: Secondary | ICD-10-CM | POA: Diagnosis not present

## 2023-07-08 DIAGNOSIS — M4317 Spondylolisthesis, lumbosacral region: Secondary | ICD-10-CM | POA: Diagnosis present

## 2023-07-08 DIAGNOSIS — E119 Type 2 diabetes mellitus without complications: Secondary | ICD-10-CM | POA: Diagnosis present

## 2023-07-08 DIAGNOSIS — Z4689 Encounter for fitting and adjustment of other specified devices: Secondary | ICD-10-CM | POA: Diagnosis not present

## 2023-07-08 HISTORY — PX: ANTERIOR LUMBAR FUSION: SHX1170

## 2023-07-08 HISTORY — PX: ABDOMINAL EXPOSURE: SHX5708

## 2023-07-08 LAB — GLUCOSE, CAPILLARY
Glucose-Capillary: 131 mg/dL — ABNORMAL HIGH (ref 70–99)
Glucose-Capillary: 133 mg/dL — ABNORMAL HIGH (ref 70–99)
Glucose-Capillary: 159 mg/dL — ABNORMAL HIGH (ref 70–99)
Glucose-Capillary: 169 mg/dL — ABNORMAL HIGH (ref 70–99)
Glucose-Capillary: 174 mg/dL — ABNORMAL HIGH (ref 70–99)
Glucose-Capillary: 185 mg/dL — ABNORMAL HIGH (ref 70–99)

## 2023-07-08 LAB — CBC
HCT: 36.8 % — ABNORMAL LOW (ref 39.0–52.0)
Hemoglobin: 11.9 g/dL — ABNORMAL LOW (ref 13.0–17.0)
MCH: 27.1 pg (ref 26.0–34.0)
MCHC: 32.3 g/dL (ref 30.0–36.0)
MCV: 83.8 fL (ref 80.0–100.0)
Platelets: 264 10*3/uL (ref 150–400)
RBC: 4.39 MIL/uL (ref 4.22–5.81)
RDW: 13.6 % (ref 11.5–15.5)
WBC: 14.6 10*3/uL — ABNORMAL HIGH (ref 4.0–10.5)
nRBC: 0 % (ref 0.0–0.2)

## 2023-07-08 LAB — ABO/RH: ABO/RH(D): B POS

## 2023-07-08 LAB — CREATININE, SERUM
Creatinine, Ser: 1.45 mg/dL — ABNORMAL HIGH (ref 0.61–1.24)
GFR, Estimated: 54 mL/min — ABNORMAL LOW (ref >60)

## 2023-07-08 SURGERY — ANTERIOR LUMBAR FUSION 1 LEVEL
Anesthesia: Regional

## 2023-07-08 MED ORDER — HYDROMORPHONE HCL 1 MG/ML IJ SOLN
INTRAMUSCULAR | Status: AC
  Filled 2023-07-08: qty 0.5

## 2023-07-08 MED ORDER — PROPOFOL 10 MG/ML IV BOLUS
INTRAVENOUS | Status: AC
  Filled 2023-07-08: qty 20

## 2023-07-08 MED ORDER — ROCURONIUM BROMIDE 10 MG/ML (PF) SYRINGE
PREFILLED_SYRINGE | INTRAVENOUS | Status: AC
  Filled 2023-07-08: qty 10

## 2023-07-08 MED ORDER — CHLORHEXIDINE GLUCONATE CLOTH 2 % EX PADS: 6.0000 | MEDICATED_PAD | Freq: Once | CUTANEOUS | Status: DC

## 2023-07-08 MED ORDER — ONDANSETRON HCL 4 MG/2ML IJ SOLN: 4.0000 mg | Freq: Four times a day (QID) | INTRAMUSCULAR | Status: DC | PRN

## 2023-07-08 MED ORDER — OXYCODONE HCL 5 MG PO TABS
5.0000 mg | ORAL_TABLET | ORAL | Status: DC | PRN
  Administered 2023-07-08 – 2023-07-10 (×5): 5 mg via ORAL
  Filled 2023-07-08 (×7): qty 1

## 2023-07-08 MED ORDER — GLYCOPYRROLATE PF 0.2 MG/ML IJ SOSY
PREFILLED_SYRINGE | INTRAMUSCULAR | Status: AC
  Filled 2023-07-08: qty 1

## 2023-07-08 MED ORDER — FENTANYL CITRATE (PF) 250 MCG/5ML IJ SOLN
INTRAMUSCULAR | Status: DC | PRN
  Administered 2023-07-08: 100 ug via INTRAVENOUS
  Administered 2023-07-08: 50 ug via INTRAVENOUS
  Administered 2023-07-08: 25 ug via INTRAVENOUS
  Administered 2023-07-08 (×3): 50 ug via INTRAVENOUS
  Administered 2023-07-08: 25 ug via INTRAVENOUS

## 2023-07-08 MED ORDER — SENNOSIDES-DOCUSATE SODIUM 8.6-50 MG PO TABS
1.0000 | ORAL_TABLET | Freq: Two times a day (BID) | ORAL | Status: DC
Start: 2023-07-08 — End: 2023-07-12
  Administered 2023-07-08 – 2023-07-11 (×6): 1 via ORAL
  Filled 2023-07-08 (×7): qty 1

## 2023-07-08 MED ORDER — SODIUM CHLORIDE 0.9 % IV SOLN: INTRAVENOUS | Status: DC | PRN

## 2023-07-08 MED ORDER — SODIUM CHLORIDE 0.9 % IV SOLN
3.0000 g | INTRAVENOUS | Status: AC
  Administered 2023-07-08 (×2): 3 g via INTRAVENOUS
  Filled 2023-07-08: qty 3

## 2023-07-08 MED ORDER — LIDOCAINE 2% (20 MG/ML) 5 ML SYRINGE
INTRAMUSCULAR | Status: AC
  Filled 2023-07-08: qty 5

## 2023-07-08 MED ORDER — PHENYLEPHRINE HCL-NACL 20-0.9 MG/250ML-% IV SOLN
INTRAVENOUS | Status: DC | PRN
  Administered 2023-07-08: 20 ug/min via INTRAVENOUS

## 2023-07-08 MED ORDER — CHLORHEXIDINE GLUCONATE 0.12 % MT SOLN
15.0000 mL | Freq: Once | OROMUCOSAL | Status: AC
  Administered 2023-07-08: 15 mL via OROMUCOSAL
  Filled 2023-07-08: qty 15

## 2023-07-08 MED ORDER — SUCCINYLCHOLINE CHLORIDE 200 MG/10ML IV SOSY
PREFILLED_SYRINGE | INTRAVENOUS | Status: AC
  Filled 2023-07-08: qty 10

## 2023-07-08 MED ORDER — LACTATED RINGERS IV SOLN: INTRAVENOUS | Status: AC

## 2023-07-08 MED ORDER — GLYCOPYRROLATE 0.2 MG/ML IJ SOLN
INTRAMUSCULAR | Status: DC | PRN
  Administered 2023-07-08: .2 mg via INTRAVENOUS

## 2023-07-08 MED ORDER — HYDROCHLOROTHIAZIDE 25 MG PO TABS
25.0000 mg | ORAL_TABLET | Freq: Every day | ORAL | Status: DC
  Administered 2023-07-08 – 2023-07-11 (×4): 25 mg via ORAL
  Filled 2023-07-08 (×4): qty 1

## 2023-07-08 MED ORDER — MIDAZOLAM HCL 2 MG/2ML IJ SOLN
INTRAMUSCULAR | Status: AC
  Filled 2023-07-08: qty 2

## 2023-07-08 MED ORDER — ORAL CARE MOUTH RINSE: 15.0000 mL | Freq: Once | OROMUCOSAL | Status: AC

## 2023-07-08 MED ORDER — ACETAMINOPHEN 650 MG RE SUPP: 650.0000 mg | RECTAL | Status: DC | PRN

## 2023-07-08 MED ORDER — MIDAZOLAM HCL 2 MG/2ML IJ SOLN
INTRAMUSCULAR | Status: DC | PRN
  Administered 2023-07-08: 2 mg via INTRAVENOUS

## 2023-07-08 MED ORDER — BUPIVACAINE-EPINEPHRINE 0.5% -1:200000 IJ SOLN
INTRAMUSCULAR | Status: DC | PRN
  Administered 2023-07-08 (×2): 10 mL

## 2023-07-08 MED ORDER — ONDANSETRON HCL 4 MG PO TABS
4.0000 mg | ORAL_TABLET | Freq: Four times a day (QID) | ORAL | Status: DC | PRN
Start: 2023-07-08 — End: 2023-07-12

## 2023-07-08 MED ORDER — FENTANYL CITRATE (PF) 250 MCG/5ML IJ SOLN
INTRAMUSCULAR | Status: AC
  Filled 2023-07-08: qty 5

## 2023-07-08 MED ORDER — PROPOFOL 500 MG/50ML IV EMUL
INTRAVENOUS | Status: DC | PRN
  Administered 2023-07-08: 80 ug/kg/min via INTRAVENOUS

## 2023-07-08 MED ORDER — DEXAMETHASONE SODIUM PHOSPHATE 10 MG/ML IJ SOLN
INTRAMUSCULAR | Status: AC
  Filled 2023-07-08: qty 1

## 2023-07-08 MED ORDER — PHENYLEPHRINE 80 MCG/ML (10ML) SYRINGE FOR IV PUSH (FOR BLOOD PRESSURE SUPPORT)
PREFILLED_SYRINGE | INTRAVENOUS | Status: DC | PRN
  Administered 2023-07-08: 80 ug via INTRAVENOUS

## 2023-07-08 MED ORDER — BUPIVACAINE-EPINEPHRINE (PF) 0.5% -1:200000 IJ SOLN
INTRAMUSCULAR | Status: AC
  Filled 2023-07-08: qty 30

## 2023-07-08 MED ORDER — ENOXAPARIN SODIUM 40 MG/0.4ML IJ SOSY
40.0000 mg | PREFILLED_SYRINGE | INTRAMUSCULAR | Status: DC
  Administered 2023-07-09 – 2023-07-11 (×3): 40 mg via SUBCUTANEOUS
  Filled 2023-07-08 (×4): qty 0.4

## 2023-07-08 MED ORDER — DEXAMETHASONE SODIUM PHOSPHATE 10 MG/ML IJ SOLN
INTRAMUSCULAR | Status: DC | PRN
  Administered 2023-07-08: 10 mg via INTRAVENOUS

## 2023-07-08 MED ORDER — ONDANSETRON HCL 4 MG/2ML IJ SOLN
INTRAMUSCULAR | Status: AC
  Filled 2023-07-08: qty 2

## 2023-07-08 MED ORDER — COLCHICINE 0.6 MG PO TABS
0.6000 mg | ORAL_TABLET | Freq: Every day | ORAL | Status: DC | PRN
Start: 2023-07-08 — End: 2023-07-12

## 2023-07-08 MED ORDER — INSULIN ASPART 100 UNIT/ML IJ SOLN: 0.0000 [IU] | Freq: Every day | INTRAMUSCULAR | Status: DC

## 2023-07-08 MED ORDER — 0.9 % SODIUM CHLORIDE (POUR BTL) OPTIME
TOPICAL | Status: DC | PRN
  Administered 2023-07-08 (×3): 1000 mL

## 2023-07-08 MED ORDER — MENTHOL 3 MG MT LOZG
1.0000 | LOZENGE | OROMUCOSAL | Status: DC | PRN
  Administered 2023-07-09: 3 mg via ORAL
  Filled 2023-07-08: qty 9

## 2023-07-08 MED ORDER — ACETAMINOPHEN 500 MG PO TABS
1000.0000 mg | ORAL_TABLET | Freq: Once | ORAL | Status: AC
  Administered 2023-07-08: 1000 mg via ORAL
  Filled 2023-07-08: qty 2

## 2023-07-08 MED ORDER — METHOCARBAMOL 1000 MG/10ML IJ SOLN: 500.0000 mg | Freq: Four times a day (QID) | INTRAMUSCULAR | Status: DC | PRN

## 2023-07-08 MED ORDER — AMISULPRIDE (ANTIEMETIC) 5 MG/2ML IV SOLN
10.0000 mg | Freq: Once | INTRAVENOUS | Status: AC
Start: 2023-07-08 — End: 2023-07-08
  Administered 2023-07-08: 10 mg via INTRAVENOUS
  Filled 2023-07-08: qty 4

## 2023-07-08 MED ORDER — ACETAMINOPHEN 10 MG/ML IV SOLN
1000.0000 mg | Freq: Once | INTRAVENOUS | Status: AC
Start: 2023-07-08 — End: 2023-07-08
  Administered 2023-07-08: 1000 mg via INTRAVENOUS

## 2023-07-08 MED ORDER — SODIUM CHLORIDE 0.9 % IV SOLN: 250.0000 mL | INTRAVENOUS | Status: AC

## 2023-07-08 MED ORDER — PHENOL 1.4 % MT LIQD: 1.0000 | OROMUCOSAL | Status: DC | PRN

## 2023-07-08 MED ORDER — HYDROCHLOROTHIAZIDE 25 MG PO TABS: 25.0000 mg | ORAL_TABLET | Freq: Every day | ORAL | Status: DC

## 2023-07-08 MED ORDER — HYDROMORPHONE HCL 1 MG/ML IJ SOLN
INTRAMUSCULAR | Status: DC | PRN
  Administered 2023-07-08 (×2): .25 mg via INTRAVENOUS
  Administered 2023-07-08: .5 mg via INTRAVENOUS

## 2023-07-08 MED ORDER — FENTANYL CITRATE (PF) 100 MCG/2ML IJ SOLN: 25.0000 ug | INTRAMUSCULAR | Status: DC | PRN

## 2023-07-08 MED ORDER — ROSUVASTATIN CALCIUM 5 MG PO TABS
10.0000 mg | ORAL_TABLET | Freq: Every day | ORAL | Status: DC
  Administered 2023-07-08 – 2023-07-09 (×2): 10 mg via ORAL
  Filled 2023-07-08 (×3): qty 2

## 2023-07-08 MED ORDER — HYDROMORPHONE HCL 1 MG/ML IJ SOLN: 0.2500 mg | INTRAMUSCULAR | Status: DC | PRN

## 2023-07-08 MED ORDER — OXYCODONE HCL 5 MG PO TABS
10.0000 mg | ORAL_TABLET | ORAL | Status: DC | PRN
  Administered 2023-07-09 – 2023-07-10 (×9): 10 mg via ORAL
  Filled 2023-07-08 (×9): qty 2

## 2023-07-08 MED ORDER — ROCURONIUM BROMIDE 10 MG/ML (PF) SYRINGE
PREFILLED_SYRINGE | INTRAVENOUS | Status: DC | PRN
  Administered 2023-07-08: 100 mg via INTRAVENOUS

## 2023-07-08 MED ORDER — METHIMAZOLE 10 MG PO TABS
10.0000 mg | ORAL_TABLET | Freq: Two times a day (BID) | ORAL | Status: DC
  Administered 2023-07-08 – 2023-07-11 (×7): 10 mg via ORAL
  Filled 2023-07-08 (×9): qty 1

## 2023-07-08 MED ORDER — AMLODIPINE BESYLATE 2.5 MG PO TABS
2.5000 mg | ORAL_TABLET | Freq: Every day | ORAL | Status: DC
  Administered 2023-07-09 – 2023-07-11 (×3): 2.5 mg via ORAL
  Filled 2023-07-08 (×4): qty 1

## 2023-07-08 MED ORDER — AMISULPRIDE (ANTIEMETIC) 5 MG/2ML IV SOLN
INTRAVENOUS | Status: AC
  Filled 2023-07-08: qty 4

## 2023-07-08 MED ORDER — KETAMINE HCL 10 MG/ML IJ SOLN
INTRAMUSCULAR | Status: DC | PRN
  Administered 2023-07-08: 20 mg via INTRAVENOUS
  Administered 2023-07-08: 30 mg via INTRAVENOUS

## 2023-07-08 MED ORDER — ONDANSETRON HCL 4 MG/2ML IJ SOLN
INTRAMUSCULAR | Status: DC | PRN
  Administered 2023-07-08: 4 mg via INTRAVENOUS

## 2023-07-08 MED ORDER — PROPOFOL 10 MG/ML IV BOLUS
INTRAVENOUS | Status: DC | PRN
  Administered 2023-07-08: 50 mg via INTRAVENOUS
  Administered 2023-07-08: 200 mg via INTRAVENOUS
  Administered 2023-07-08: 50 mg via INTRAVENOUS

## 2023-07-08 MED ORDER — HYDROMORPHONE HCL 1 MG/ML IJ SOLN: 1.0000 mg | INTRAMUSCULAR | Status: AC | PRN

## 2023-07-08 MED ORDER — THROMBIN 20000 UNITS EX SOLR
CUTANEOUS | Status: DC | PRN
  Administered 2023-07-08: 20 mL via TOPICAL

## 2023-07-08 MED ORDER — LACTATED RINGERS IV SOLN: INTRAVENOUS | Status: DC

## 2023-07-08 MED ORDER — THROMBIN 20000 UNITS EX KIT
PACK | CUTANEOUS | Status: AC
  Filled 2023-07-08: qty 1

## 2023-07-08 MED ORDER — SUGAMMADEX SODIUM 200 MG/2ML IV SOLN
INTRAVENOUS | Status: DC | PRN
  Administered 2023-07-08: 150 mg via INTRAVENOUS

## 2023-07-08 MED ORDER — MAGNESIUM CITRATE PO SOLN
1.0000 | Freq: Once | ORAL | Status: AC | PRN
  Administered 2023-07-09: 1 via ORAL
  Filled 2023-07-08: qty 296

## 2023-07-08 MED ORDER — SODIUM CHLORIDE 0.9% FLUSH: 3.0000 mL | INTRAVENOUS | Status: DC | PRN

## 2023-07-08 MED ORDER — ROPIVACAINE HCL 5 MG/ML IJ SOLN
INTRAMUSCULAR | Status: DC | PRN
  Administered 2023-07-08: 30 mL via PERINEURAL

## 2023-07-08 MED ORDER — CEFAZOLIN SODIUM-DEXTROSE 1-4 GM/50ML-% IV SOLN
1.0000 g | Freq: Three times a day (TID) | INTRAVENOUS | Status: AC
  Administered 2023-07-08 – 2023-07-09 (×2): 1 g via INTRAVENOUS
  Filled 2023-07-08 (×3): qty 50

## 2023-07-08 MED ORDER — LIDOCAINE HCL (CARDIAC) PF 100 MG/5ML IV SOSY
PREFILLED_SYRINGE | INTRAVENOUS | Status: DC | PRN
  Administered 2023-07-08: 100 mg via INTRAVENOUS

## 2023-07-08 MED ORDER — ACETAMINOPHEN 10 MG/ML IV SOLN
INTRAVENOUS | Status: AC
  Filled 2023-07-08: qty 100

## 2023-07-08 MED ORDER — CEFAZOLIN SODIUM 1 G IJ SOLR
INTRAMUSCULAR | Status: AC
  Filled 2023-07-08: qty 30

## 2023-07-08 MED ORDER — INSULIN ASPART 100 UNIT/ML IJ SOLN
0.0000 [IU] | Freq: Three times a day (TID) | INTRAMUSCULAR | Status: DC
  Administered 2023-07-08: 3 [IU] via SUBCUTANEOUS
  Administered 2023-07-09 – 2023-07-10 (×3): 2 [IU] via SUBCUTANEOUS

## 2023-07-08 MED ORDER — METFORMIN HCL 500 MG PO TABS
500.0000 mg | ORAL_TABLET | Freq: Every morning | ORAL | Status: DC
Start: 2023-07-10 — End: 2023-07-12
  Administered 2023-07-10 – 2023-07-12 (×3): 500 mg via ORAL
  Filled 2023-07-08 (×4): qty 1

## 2023-07-08 MED ORDER — METHOCARBAMOL 500 MG PO TABS
500.0000 mg | ORAL_TABLET | Freq: Four times a day (QID) | ORAL | Status: DC | PRN
  Administered 2023-07-08 – 2023-07-10 (×7): 500 mg via ORAL
  Filled 2023-07-08 (×7): qty 1

## 2023-07-08 MED ORDER — SODIUM CHLORIDE 0.9% FLUSH
3.0000 mL | Freq: Two times a day (BID) | INTRAVENOUS | Status: DC
  Administered 2023-07-08 – 2023-07-09 (×3): 3 mL via INTRAVENOUS

## 2023-07-08 MED ORDER — ALBUMIN HUMAN 5 % IV SOLN: INTRAVENOUS | Status: DC | PRN

## 2023-07-08 MED ORDER — KETAMINE HCL 50 MG/5ML IJ SOSY
PREFILLED_SYRINGE | INTRAMUSCULAR | Status: AC
  Filled 2023-07-08: qty 5

## 2023-07-08 MED ORDER — POLYETHYLENE GLYCOL 3350 17 G PO PACK: 17.0000 g | PACK | Freq: Every day | ORAL | Status: DC | PRN

## 2023-07-08 MED ORDER — LOSARTAN POTASSIUM 50 MG PO TABS
100.0000 mg | ORAL_TABLET | Freq: Every day | ORAL | Status: DC
  Administered 2023-07-08 – 2023-07-11 (×4): 100 mg via ORAL
  Filled 2023-07-08 (×4): qty 2

## 2023-07-08 MED ORDER — TRANEXAMIC ACID-NACL 1000-0.7 MG/100ML-% IV SOLN
1000.0000 mg | INTRAVENOUS | Status: AC
  Administered 2023-07-08: 1000 mg via INTRAVENOUS
  Filled 2023-07-08: qty 100

## 2023-07-08 MED ORDER — ACETAMINOPHEN 325 MG PO TABS
650.0000 mg | ORAL_TABLET | ORAL | Status: DC | PRN
Start: 2023-07-08 — End: 2023-07-10

## 2023-07-08 SURGICAL SUPPLY — 102 items
ANCHOR LUMBAR 25 MIS (Anchor) IMPLANT
APPLIER CLIP 11 MED OPEN (CLIP) ×2 IMPLANT
BAG COUNTER SPONGE SURGICOUNT (BAG) ×3 IMPLANT
BLADE CLIPPER SURG (BLADE) IMPLANT
BLADE SURG 10 STRL SS (BLADE) ×1 IMPLANT
BUR EGG ELITE 4.0 (BURR) ×1 IMPLANT
BUR MATCHSTICK NEURO 3.0 LAGG (BURR) IMPLANT
BUR SURG 4X8 MED (BURR) IMPLANT
BURR SURG 4X8 MED (BURR) ×1 IMPLANT
CABLE BIPOLOR RESECTION CORD (MISCELLANEOUS) ×2 IMPLANT
CANISTER SUCT 3000ML PPV (MISCELLANEOUS) ×1 IMPLANT
CAP RELINE MOD TULIP RMM (Cap) IMPLANT
CLIP APPLIE 11 MED OPEN (CLIP) ×2 IMPLANT
CLIP LIGATING EXTRA MED SLVR (CLIP) IMPLANT
CLSR STERI-STRIP ANTIMIC 1/2X4 (GAUZE/BANDAGES/DRESSINGS) ×3 IMPLANT
COVER SURGICAL LIGHT HANDLE (MISCELLANEOUS) ×2 IMPLANT
DRAIN CHANNEL 15F RND FF W/TCR (WOUND CARE) IMPLANT
DRAPE C-ARM 42X72 X-RAY (DRAPES) ×3 IMPLANT
DRAPE C-ARMOR (DRAPES) ×2 IMPLANT
DRAPE POUCH INSTRU U-SHP 10X18 (DRAPES) ×2 IMPLANT
DRAPE SURG 17X23 STRL (DRAPES) ×2 IMPLANT
DRAPE U-SHAPE 47X51 STRL (DRAPES) ×2 IMPLANT
DRSG OPSITE POSTOP 4X6 (GAUZE/BANDAGES/DRESSINGS) IMPLANT
DRSG OPSITE POSTOP 4X8 (GAUZE/BANDAGES/DRESSINGS) ×2 IMPLANT
DURAPREP 26ML APPLICATOR (WOUND CARE) ×2 IMPLANT
ELECT BLADE 4.0 EZ CLEAN MEGAD (MISCELLANEOUS) ×3 IMPLANT
ELECT BLADE 6.5 EXT (BLADE) IMPLANT
ELECT CAUTERY BLADE 6.4 (BLADE) ×1 IMPLANT
ELECT PENCIL ROCKER SW 15FT (MISCELLANEOUS) ×2 IMPLANT
ELECT REM PT RETURN 9FT ADLT (ELECTROSURGICAL) ×2 IMPLANT
ELECTRODE BLDE 4.0 EZ CLN MEGD (MISCELLANEOUS) ×3 IMPLANT
ELECTRODE REM PT RTRN 9FT ADLT (ELECTROSURGICAL) ×2 IMPLANT
GAUZE 4X4 16PLY ~~LOC~~+RFID DBL (SPONGE) IMPLANT
GLOVE BIO SURGEON STRL SZ 6.5 (GLOVE) ×1 IMPLANT
GLOVE BIO SURGEON STRL SZ7.5 (GLOVE) ×1 IMPLANT
GLOVE BIOGEL PI IND STRL 6.5 (GLOVE) ×1 IMPLANT
GLOVE BIOGEL PI IND STRL 8 (GLOVE) ×1 IMPLANT
GLOVE BIOGEL PI IND STRL 8.5 (GLOVE) ×3 IMPLANT
GLOVE SS BIOGEL STRL SZ 8.5 (GLOVE) ×3 IMPLANT
GOWN STRL REUS W/ TWL LRG LVL3 (GOWN DISPOSABLE) ×2 IMPLANT
GOWN STRL REUS W/ TWL XL LVL3 (GOWN DISPOSABLE) ×1 IMPLANT
GOWN STRL REUS W/TWL 2XL LVL3 (GOWN DISPOSABLE) ×5 IMPLANT
HEMOSTAT SNOW SURGICEL 2X4 (HEMOSTASIS) IMPLANT
INSERT FOGARTY 61MM (MISCELLANEOUS) IMPLANT
INSERT FOGARTY SM (MISCELLANEOUS) IMPLANT
KIT BASIN OR (CUSTOM PROCEDURE TRAY) ×2 IMPLANT
KIT POSITION SURG JACKSON T1 (MISCELLANEOUS) IMPLANT
KIT TURNOVER KIT B (KITS) ×2 IMPLANT
MIX DBX 20CC MTF (Putty) IMPLANT
MODULE EMG NDL SSEP NVM5 (NEUROSURGERY SUPPLIES) IMPLANT
MODULE EMG NEEDLE SSEP NVM5 (NEUROSURGERY SUPPLIES) ×1 IMPLANT
MODULE NVM5 NEXT GEN EMG (NEUROSURGERY SUPPLIES) IMPLANT
NDL 22X1.5 STRL (OR ONLY) (MISCELLANEOUS) ×1 IMPLANT
NDL SPNL 18GX3.5 QUINCKE PK (NEEDLE) ×3 IMPLANT
NEEDLE 22X1.5 STRL (OR ONLY) (MISCELLANEOUS) ×1 IMPLANT
NEEDLE SPNL 18GX3.5 QUINCKE PK (NEEDLE) ×3 IMPLANT
NS IRRIG 1000ML POUR BTL (IV SOLUTION) ×2 IMPLANT
PACK LAMINECTOMY ORTHO (CUSTOM PROCEDURE TRAY) ×2 IMPLANT
PACK UNIVERSAL I (CUSTOM PROCEDURE TRAY) ×2 IMPLANT
PAD ARMBOARD 7.5X6 YLW CONV (MISCELLANEOUS) ×6 IMPLANT
PATTIES SURGICAL .5 X.5 (GAUZE/BANDAGES/DRESSINGS) IMPLANT
PATTIES SURGICAL .5 X1 (DISPOSABLE) ×1 IMPLANT
POSITIONER HEAD PRONE TRACH (MISCELLANEOUS) ×1 IMPLANT
PROBE BALL TIP NVM5 SNG USE (NEUROSURGERY SUPPLIES) IMPLANT
PUTTY BONE DBX 5CC MIX (Putty) IMPLANT
ROD RELINE 5.0X45MM (Rod) IMPLANT
SCREW LOCK RSS 4.5/5.0MM (Screw) IMPLANT
SCREW SHANK RELINE 6.5X40MM (Screw) IMPLANT
SCREW SHANK RELINE MOD 6.5X35 (Screw) IMPLANT
SHANK RELINE O MOD 5.5X45MM (Screw) IMPLANT
SPACER HEDRON IA 29X39X13 8D (Spacer) IMPLANT
SPONGE INTESTINAL PEANUT (DISPOSABLE) ×4 IMPLANT
SPONGE SURGIFOAM ABS GEL 100 (HEMOSTASIS) ×1 IMPLANT
SPONGE T-LAP 18X18 ~~LOC~~+RFID (SPONGE) ×1 IMPLANT
SPONGE T-LAP 4X18 ~~LOC~~+RFID (SPONGE) ×2 IMPLANT
STAPLER VISISTAT 35W (STAPLE) ×1 IMPLANT
SURGIFLO W/THROMBIN 8M KIT (HEMOSTASIS) ×1 IMPLANT
SUT BONE WAX W31G (SUTURE) ×2 IMPLANT
SUT MNCRL AB 3-0 PS2 27 (SUTURE) ×3 IMPLANT
SUT MNCRL+ AB 3-0 CT1 36 (SUTURE) ×1 IMPLANT
SUT PDS AB 1 CTX 36 (SUTURE) ×1 IMPLANT
SUT PROLENE 4-0 RB1 .5 CRCL 36 (SUTURE) IMPLANT
SUT PROLENE 5 0 C 1 24 (SUTURE) ×1 IMPLANT
SUT PROLENE 5 0 CC1 (SUTURE) IMPLANT
SUT PROLENE 6 0 C 1 30 (SUTURE) IMPLANT
SUT PROLENE 6 0 CC (SUTURE) IMPLANT
SUT SILK 0 TIES 10X30 (SUTURE) IMPLANT
SUT SILK 2 0 TIES 10X30 (SUTURE) ×2 IMPLANT
SUT SILK 2 0SH CR/8 30 (SUTURE) IMPLANT
SUT SILK 3 0 TIES 10X30 (SUTURE) ×1 IMPLANT
SUT SILK 3 0SH CR/8 30 (SUTURE) IMPLANT
SUT SILK 3-0 18XBRD TIE BLK (SUTURE) IMPLANT
SUT VIC AB 1 CT1 18XCR BRD 8 (SUTURE) ×1 IMPLANT
SUT VIC AB 1 CT1 27XBRD ANBCTR (SUTURE) ×2 IMPLANT
SUT VIC AB 2-0 CT1 18 (SUTURE) ×2 IMPLANT
SYR BULB IRRIG 60ML STRL (SYRINGE) ×2 IMPLANT
SYR CONTROL 10ML LL (SYRINGE) ×1 IMPLANT
TOWEL GREEN STERILE (TOWEL DISPOSABLE) ×3 IMPLANT
TOWEL GREEN STERILE FF (TOWEL DISPOSABLE) ×2 IMPLANT
TRAY FOLEY MTR SLVR 16FR STAT (SET/KITS/TRAYS/PACK) ×2 IMPLANT
WATER STERILE IRR 1000ML POUR (IV SOLUTION) ×1 IMPLANT
YANKAUER SUCT BULB TIP NO VENT (SUCTIONS) ×1 IMPLANT

## 2023-07-08 NOTE — Op Note (Signed)
    NAME: Jeffery [name]    MRN: [id number] DOB: [birth year]    DATE OF OPERATION: 07/08/2023  PREOP DIAGNOSIS:    Degenerative lumbar disc disease with foraminal stenosis and radiculopathy L5-S1  POSTOP DIAGNOSIS:    Same  PROCEDURE:    Anterior spine exposure via anterior retroperitoneal approach for L5-S1 ALIF   SURGEON: Victorino Sparrow Orthopedic spine surgeon: Venita Lick  ASSIST: Sherald Hess, MD  ANESTHESIA: General  EBL: 20 mL  INDICATIONS:    Jeffery [name] is a 62 y.o. male with chronic lower back pain that is failed conservative management.  Dr. Shon Baton has recommended anterior lumbar interbody fusion at L5-S1 and asked vascular surgery to assist with exposure patient presents after risks benefits discussed.  FINDINGS:    L5-S1 disc space visualized and prepped for ALIF  TECHNIQUE:   Patient is brought to the operating room laid in the supine position.  General anesthesia was induced.  A C-arm was used in the lateral position to mark the L5-S1 disc space over the left rectus muscle.  The abdominal wall was then prepped and draped in standard fashion.  A transverse incision was made to fingerbreadths from the pubis along the left rectus muscle.  This was taken down to the fascia and the anterior fascia of the left rectus muscle opened.  The left rectus was mobilized.  Cautery was used to create flaps under the anterior fascia.  Blunt dissection was used to enter the retroperitoneum lateral to the anterior rectus.  The peritoneum was swept infero-medial.  With my assistant, I was able to use hand-held Wiley retractors to pull the peritoneum and left ureter to midline.  I was able to visualize the L5-S1 disc space.  The middle sacral vessels were divided using clips.  I used a Kd to mobilize soft tissue directly on the anterior longitudinal ligament.  Special care was taken to ensure the left common iliac vein was mobilized.  The left external iliac system was  in our field-of-view and protected.  Next, the fixed NuVasive retractor was placed.  I used 160 mm reverse lip retractor blades x 3, with the caudad retractor 120 mm. A needle was used to confirm I was at the correct.  Both Dr. Shon Baton and I agreed that we were.  I then turned the case over to Dr. Shon Baton.  Victorino Sparrow, MD [facility] DATE OF DICTATION:   07/08/2023

## 2023-07-08 NOTE — Anesthesia Procedure Notes (Signed)
Procedure Name: Intubation Date/Time: 07/08/2023 8:12 AM  Performed by: Yolonda Kida, CRNAPre-anesthesia Checklist: Patient identified, Emergency Drugs available, Suction available and Patient being monitored Patient Re-evaluated:Patient Re-evaluated prior to induction Oxygen Delivery Method: Circle System Utilized Preoxygenation: Pre-oxygenation with 100% oxygen Induction Type: IV induction Ventilation: Mask ventilation without difficulty Laryngoscope Size: Glidescope and 4 Grade View: Grade II Tube type: Oral Tube size: 7.5 mm Number of attempts: 1 Airway Equipment and Method: Rigid stylet and Video-laryngoscopy Placement Confirmation: ETT inserted through vocal cords under direct vision, positive ETCO2 and breath sounds checked- equal and bilateral Secured at: 24 cm Tube secured with: Tape Dental Injury: Teeth and Oropharynx as per pre-operative assessment  Comments: Performed by Carroll Sage

## 2023-07-08 NOTE — Transfer of Care (Signed)
Immediate Anesthesia Transfer of Care Note  Patient: Jeffery [name]  Procedure(s) Performed: Anterior lumbar interbody fusion L5-S1 with posterior supplemental fusion POSTERIOR LUMBAR FUSION 1 LEVEL ABDOMINAL EXPOSURE  Patient Location: PACU  Anesthesia Type:General  Level of Consciousness: drowsy  Airway & Oxygen Therapy: Patient Spontanous Breathing and Patient connected to face mask oxygen  Post-op Assessment: Report given to RN and Post -op Vital signs reviewed and stable  Post vital signs: Reviewed and stable  Last Vitals:  Vitals Value Taken Time  BP 132/82 07/08/23 1355  Temp    Pulse 88 07/08/23 1357  Resp    SpO2 100 % 07/08/23 1357  Vitals shown include unfiled device data.  Last Pain:  Vitals:   07/08/23 0644  TempSrc:   PainSc: 5       Patients Stated Pain Goal: 4 (07/08/23 1914)  Complications: No notable events documented.

## 2023-07-08 NOTE — Op Note (Signed)
OPERATIVE REPORT  DATE OF SURGERY: 07/08/2023  PATIENT NAME:  Jeffery [name] MRN: [id number] DOB: 11-04-1960  PCP: Emilio Aspen, MD  PRE-OPERATIVE DIAGNOSIS: Degenerative lumbar disc disease with foraminal stenosis and radiculopathy L5-S1  POST-OPERATIVE DIAGNOSIS: Same  PROCEDURE:   1.  Anterior lumbar interbody fusion L5-S1 2.  Posterior instrumented fusion L5-S1. 3.  Posterior lateral arthrodesis L5-S1.  SURGEON:  Venita Lick, MD  PHYSICIAN ASSISTANT: Roderic Palau  Approach Surgeon: Dr. Gillis Santa   ANESTHESIA:   General  EBL: See anesthesia report   Complications: None  Implants: Globus anterior intervertebral cage.  13 mm height 8 degree lordosis.  25 mm locking anchors.  NuVasive cortical pedicle screws.  5.5 x 45 mm length at L5.  6.5 x 40 mm length rod at S1.  Graft: DBX mix  Neuromonitoring: No adverse SSEP or free running EMG activity throughout the posterior surgery.  All screws were directly stimulated.  L5: Positive activity at 37 mA on the left and greater than 40 mA on the right.  S1: Positive activity 25 mA on the left and 28 mA on the right.  No evidence to suggest breach or neural compromise.    BRIEF HISTORY: Jeffery [name] is a 62 y.o. male who has had significant back buttock and neuropathic leg pain for some time now.  Despite appropriate conservative management his quality of life and continued to deteriorate.  As a result of the failure to improve we elected to move forward with surgery.  All appropriate risks, benefits, and alternatives were discussed with the patient and consent was obtained.  PROCEDURE DETAILS: Patient was brought into the operating room and was properly positioned on the operating room table.  After induction with general anesthesia the patient was endotracheally intubated.  A timeout was taken to confirm all important data: including patient, procedure, and the level. Teds, SCD's were applied.   Patient was placed  supine on the [place] table and a Foley was inserted by the nurse.  The abdomen was prepped and draped in a standard fashion.  At this point time Dr.'s  Karin Lieu and Chestine Spore the vascular surgeons performed a standard anterior retroperitoneal approach to the lumbar spine.  Please refer to their dictation for details.  Once the approach was complete and the retractor system was assembled a needle was placed into the L5-S1 disc.  Intraoperative fluoroscopy view confirmed to be at the appropriate level.  At this point in time Dr. Karin Lieu scrubbed out and I scrubbed back into and continued the case.  Annulotomy of L5-S1 was performed with a 10 blade scalpel and I remove the bulk of the disc material with pituitary rongeurs.  There was significant collapse of the posterior third of the disc space and much time and attention was taken to remove all of the disc material.  Using small curettes I continue to work my way posteriorly removing the hard disk material.  Ultimately I had removed all of the disc material and was now visualized in the posterior aspect of the disc space.  I then took a small curved curette and began working posteriorly in order to release the posterior annulus from the body of S1.  Once my \\nerve  curette could contact the posterior aspect of the S1 vertebral body I released the posterior annulus.  I then removed much of the posterior annulus with 2 mm Kerrison rongeur and the posterior osteophyte from the L5 and S1 vertebral bodies.  At this point on imaging I had  improved the posterior height of the vertebral body and I could expand the disc space with a lamina spreader and confirm I had parallel endplate distraction.  I then placed the trialing devices and noted significant improvement in the disc height and the foraminal space.  At this point I then trialed with the intervertebral breast spacers and elected to use the large 13 mm implant.  This had 8 degrees of lordosis and was 29 x 39 mm.  Once I  had rasped the endplates and irrigated the wound I then obtained the cage and placed it into the disc space.  Once it was properly seated I was able to countersink it several millimeters until the posterior aspect of the cage came to rest right on the posterior wall of S1.  I then placed the locking staples 1 into the body of L5 and 2 into the body of S1.  At this point time I took my final x-rays and realized that my implant was slightly to the right.  Given the complexity of removing the cage I did feel as though I still had an adequate positioning and stabilization of the disc space.  I elected not to reposition the cage as more likely this would cause more potential harm.  I then locked the locking device to prevent the staples from backing out and irrigated the wound copiously with normal saline.  I then sequentially removed the retractors confirming I had hemostasis.  Once all the retractors were removed I closed the fascia of the rectus with a running #1 strata fix suture.  I irrigated again and then closed in layered fashion with interrupted 2-0 Vicryl suture, followed by 3-0 Monocryl.  Steri-Strips and dry dressings were then applied.  The [facility] spine frame was then secured to the patient and they were rotated into the prone position.  The arms were placed overhead and the back was prepped and draped in a standard fashion.  Using fluoroscopy I marked out the inferior aspect of the L5 and S1 pedicles.  I marked out my incision and infiltrated with quarter percent Marcaine with epinephrine.  Posterior incision was made and I sharply dissected down to the deep fascia.  The deep fascia was incised and I stripped the paraspinal muscles to expose the L4-5 and L5-S1 facet complexes.  Care was taken not to violate the facet capsule at L4-5.  I then used imaging again to confirm the location of the L5-S1 facet and then I remove the facet capsule.  A high-speed bur was then placed on the inferior medial corner  of the L5 pedicle was seen on the AP view.  I broached the cortex and then placed the pedicle awl.  The pedicle awl was then advanced starting at the inferior medial corner of the pedicle aiming towards the superior lateral corner.  Once I confirmed the trajectory I advanced towards the lateral third of the pedicle.  Once I was in the lateral third of the pedicle on the AP view I confirmed on the lateral that I was just beyond the posterior wall.  Once I confirmed my trajectory advanced into the vertebral body.  I removed the pedicle awl and palpated the hole with a ball-tipped feeler to ensure the integrity of the path.  I then placed the tap and repalpated with a ball-tipped feeler.  Once I confirmed that the pedicle tract was intact I placed the pedicle screw.  Had excellent purchase.  Using this exact technique I placed my contralateral  L5 pedicle screw.  I then repositioned my retractor to expose the S1.  The high-speed bur was placed on the medial portion of the pedicle in the midline and I decorticated.  The awl was then placed and advanced towards the lateral side of the pedicle maintaining its midline position.  I confirmed in both planes that I had adequate trajectory and position.  I also then palpated down both awl tracks with a ball-tipped feeler confirming depth and at the path was intact.  I then tapped with the 6.5 diameter tap and repalpated the hole.  The hole was intact there was no evidence of breach.  The 6.5 diameter screw was then placed.  All 4 screws had excellent purchase.  They were all directly stimulated and there was no free running EMG activity to suggest breach and neural compromise.  I then used a high-speed bur to decorticate the L5-S1 facet complex as well as the L5 pars and L5 transverse process.  Bone graft was then placed in the posterior lateral gutter.  I then applied the polyaxial heads and the 45 mm rod.  The rods were then secured with the locking caps.    Wound was now  irrigated and the retractors were removed.  Final imaging demonstrated satisfactory position of the pedicle screw rod construct as well as the intervertebral cage.  The deep fascia of the posterior wound was closed with interrupted #1 Vicryl sutures, then I used a layer of 0 running followed by interrupted 2-0 Vicryl sutures.  The skin was closed with 3-0 Monocryl.  Steri-Strips and dry dressings were applied and the patient was ultimately extubated transferred the PACU without incident.  The end of the case all needle sponge counts were correct.  Venita Lick, MD 07/08/2023 1:36 PM

## 2023-07-08 NOTE — Brief Op Note (Signed)
07/08/2023  1:55 PM  PATIENT:  Jeffery [name]  62 y.o. male  PRE-OPERATIVE DIAGNOSIS:  degenerative disc disease with radicular left pain L5-S1  POST-OPERATIVE DIAGNOSIS:  * No post-op diagnosis entered *  PROCEDURE:  Procedure(s) with comments: Anterior lumbar interbody fusion L5-S1 with posterior supplemental fusion (N/A) - POSTERIOR LUMBAR FUSION 1 LEVEL (N/A) ABDOMINAL EXPOSURE (N/A)  SURGEON:  Surgeons and Role: Panel 1:    Venita Lick, MD - Primary    * Victorino Sparrow, MD Panel 2:    Cephus Shelling, MD - Primary  PHYSICIAN ASSISTANT:   ASSISTANTS: Luther Bradley  ANESTHESIA:   general  EBL:  150 mL   BLOOD ADMINISTERED:none  DRAINS: none   LOCAL MEDICATIONS USED:  MARCAINE     SPECIMEN:  No Specimen  DISPOSITION OF SPECIMEN:  N/A  COUNTS:  YES  TOURNIQUET:  * No tourniquets in log *  DICTATION: .Dragon Dictation  PLAN OF CARE: Admit to inpatient   PATIENT DISPOSITION:  PACU - hemodynamically stable.

## 2023-07-08 NOTE — Anesthesia Postprocedure Evaluation (Signed)
Anesthesia Post Note  Patient: [name]  Procedure(s) Performed: Anterior lumbar interbody fusion L5-S1 with posterior supplemental fusion POSTERIOR LUMBAR FUSION 1 LEVEL ABDOMINAL EXPOSURE     Patient location during evaluation: PACU Anesthesia Type: Regional and General Level of consciousness: awake and alert Pain management: pain level controlled Vital Signs Assessment: post-procedure vital signs reviewed and stable Respiratory status: spontaneous breathing, nonlabored ventilation, respiratory function stable and patient connected to nasal cannula oxygen Cardiovascular status: blood pressure returned to baseline and stable Postop Assessment: no apparent nausea or vomiting Anesthetic complications: no  No notable events documented.  Last Vitals:  Vitals:   07/08/23 1445 07/08/23 1500  BP: (!) 158/86 (!) 158/91  Pulse: (!) 101 100  Resp: 12 15  Temp:  36.7 C  SpO2: 93% 94%    Last Pain:  Vitals:   07/08/23 1500  TempSrc:   PainSc: 0-No pain                 Fredericka Bottcher L Kenden Brandt

## 2023-07-08 NOTE — Anesthesia Procedure Notes (Signed)
Anesthesia Regional Block: Quadratus lumborum   Pre-Anesthetic Checklist: , timeout performed,  Correct Patient, Correct Site, Correct Laterality,  Correct Procedure, Correct Position, site marked,  Risks and benefits discussed,  Pre-op evaluation,  At surgeon's request and post-op pain management  Laterality: Left  Prep: Maximum Sterile Barrier Precautions used, chloraprep       Needles:  Injection technique: Single-shot  Needle Type: Echogenic Stimulator Needle     Needle Length: 9cm  Needle Gauge: 21     Additional Needles:   Procedures:,,,, ultrasound used (permanent image in chart),,    Narrative:  Start time: 07/08/2023 7:17 AM End time: 07/08/2023 7:22 AM Injection made incrementally with aspirations every 5 mL. Anesthesiologist: Elmer Picker, MD

## 2023-07-08 NOTE — H&P (Signed)
History: Jeffery [name] is a very pleasant 62 year old gentleman with longstanding significant back pain and intermittent neuropathic leg pain. Despite injection therapy, physical therapy, and activity modification his quality of life is continued to deteriorate. As a result of the failure of conservative management and the progressive loss and quality of life secondary to pain we have elected to move forward with surgery. Based on imaging studies and clinical exam I believe the principal issue is the spondylolisthesis and degenerative disc disease contributing to lumbar radiculopathy at L5-S1. Surgical plan is an anterior lumbar interbody fusion with supplemental posterior pedicle screw fixation at L5-S1.  Past Medical History:  Diagnosis Date   Anal fissure    Diabetes mellitus without complication (HCC)    Type 2   Fatty liver    GERD (gastroesophageal reflux disease)    Hyperplastic rectal polyp    Hypertension    Hyperthyroidism    Lipoma    Lumbar degenerative disc disease     Allergies  Allergen Reactions   Other Hives    ONLY ORAL steroids. Hives on upper torso only.   Nsaids Hives   Peanut Oil Hives   Peanut-Containing Drug Products Other (See Comments)   Prednisone Hives    No current facility-administered medications on file prior to encounter.   Current Outpatient Medications on File Prior to Encounter  Medication Sig Dispense Refill   amLODipine (NORVASC) 5 MG tablet Take 2.5 mg by mouth daily.     hydrochlorothiazide (HYDRODIURIL) 25 MG tablet Take 25 mg by mouth daily.     losartan (COZAAR) 100 MG tablet Take 100 mg by mouth daily.     Omega 3 1000 MG CAPS Take 1,000 mg by mouth daily.     ondansetron (ZOFRAN-ODT) 4 MG disintegrating tablet Take 1 tablet (4 mg total) by mouth every 8 (eight) hours as needed for nausea or vomiting. 10 tablet 0   rosuvastatin (CRESTOR) 10 MG tablet Take 10 mg by mouth at bedtime.     diazepam (VALIUM) 5 MG tablet Take 1 tablet 30 to 60  minutes prior to MRI scan.  Do not operate motor vehicle while taking this medication. (Patient not taking: Reported on 06/28/2023) 2 tablet 0    Physical Exam: Vitals:   07/08/23 0553  BP: (!) 162/89  Pulse: 90  Resp: 18  Temp: 97.7 F (36.5 C)  SpO2: 98%   Body mass index is 34.02 kg/m. Clinical exam: Jeffery [name] is a pleasant individual, who appears younger than their stated age.  He is alert and orientated 3.  No shortness of breath, chest pain.  Abdomen is soft and non-tender, negative loss of bowel and bladder control, no rebound tenderness.  Negative: skin lesions abrasions contusions  Peripheral pulses: 2+ peripheral pulses bilaterally. LE compartments are: Soft and nontender.  Gait pattern: Normal  Assistive devices: None  Neuro: 5/5 motor strength in the lower extremity bilaterally. Normal sensation to light touch throughout. Negative Babinski test, no clonus, negative straight leg raise test. 1+ deep tendon reflexes in the lower extremity bilaterally.  Musculoskeletal: Significant low back pain that is intensified with rotation and forward flexion. No SI joint pain. No hip, knee, ankle pain with nicely joint range of motion  Imaging: X-rays of the lumbar spine demonstrate advanced degenerative disc disease L5-S1. Slight retrolisthesis L5-S1 is noted. No fractures seen.  Flexion-extension views: There is increased retrolisthesis in flexion contributing to progressive foraminal stenosis with greater than 4 mm of anterior translation and extension. X-rays were also  reviewed from 05/28/2017 which demonstrate degenerative disc disease but not as severe at L5-S1 and no retrolisthesis.  Lumbar MRI: completed on 12/20/2022. Advanced degenerative disc disease L5-S1 with loss of normal disc space height and posterior osteophyte formation contributing to lateral recess and foraminal stenosis. No significant central stenosis. Mild to moderate degenerative changes in the remainder of  the lumbar spine with multiple small disc protrusions but no neural compression.   A/P: Impression: Jeffery [name] is a very pleasant 63 year old gentleman with dynamic retrolisthesis at L5-S1 along with significant degenerative disc disease. Unfortunately conservative management has not provided any improvement in quality of life or pain control. Although in the supine position the MRI did not demonstrate any significant stenosis when x-rays were taken in the upright position he develops a retrolisthesis which contributes to dynamic foraminal and lateral recess stenosis at L5-S1. There is more than 4 mm of change in the listhesis in flexion extension views. At this point I believe he has dynamic stenosis and so surgical intervention is warranted.  Risks and benefits of anterior lumbar fusion surgery were discussed with the patient. These include: Infection, bleeding, death, stroke, paralysis, ongoing or worse pain, need for additional surgery, nonunion, leak of spinal fluid, adjacent segment degeneration requiring additional fusion surgery, need for posterior decompression and/or fusion. Bleeding from major vessels, and blood clots (deep venous thrombosis)requiring additional treatment, loss in bowel and bladder control, injury to bladder, ureters, and other major abdominal organs. (risks for men also include retrograde ejaculation)  Risks and benefits of posterior spinal fusion: Infection, bleeding, death, stroke, paralysis, ongoing or worse pain, need for additional surgery, nonunion, leak of spinal fluid, adjacent segment degeneration requiring additional fusion surgery, Injury to abdominal vessels that can require anterior surgery to stop bleeding. Malposition of the cage and/or pedicle screws that could require additional surgery. Loss of bowel and bladder control. Postoperative hematoma causing neurologic compression that could require urgent or emergent re-operation  Surgical plan: Anterior lumbar interbody  fusion L5-S1 with supplemental posterior pedicle screw fixation and arthrodesis.  Implants: Globus titanium intervertebral cage. NuVasive posterior cortical pedicle screws.  Graft: DBX mix.

## 2023-07-08 NOTE — Plan of Care (Signed)
  Problem: Education: Goal: Knowledge of General Education information will improve Description: Including pain rating scale, medication(s)/side effects and non-pharmacologic comfort measures Outcome: Progressing   Problem: Health Behavior/Discharge Planning: Goal: Ability to manage health-related needs will improve Outcome: Progressing   Problem: Clinical Measurements: Goal: Ability to maintain clinical measurements within normal limits will improve Outcome: Progressing Goal: Will remain free from infection Outcome: Progressing Goal: Diagnostic test results will improve Outcome: Progressing Goal: Respiratory complications will improve Outcome: Progressing Goal: Cardiovascular complication will be avoided Outcome: Progressing   Problem: Activity: Goal: Risk for activity intolerance will decrease Outcome: Progressing   Problem: Nutrition: Goal: Adequate nutrition will be maintained Outcome: Progressing   Problem: Coping: Goal: Level of anxiety will decrease Outcome: Progressing   Problem: Elimination: Goal: Will not experience complications related to bowel motility Outcome: Progressing Goal: Will not experience complications related to urinary retention Outcome: Progressing   Problem: Pain Management: Goal: General experience of comfort will improve Outcome: Progressing   Problem: Safety: Goal: Ability to remain free from injury will improve Outcome: Progressing   Problem: Skin Integrity: Goal: Risk for impaired skin integrity will decrease Outcome: Progressing   Problem: Education: Goal: Ability to describe self-care measures that may prevent or decrease complications (Diabetes Survival Skills Education) will improve Outcome: Progressing Goal: Individualized Educational Video(s) Outcome: Progressing   Problem: Coping: Goal: Ability to adjust to condition or change in health will improve Outcome: Progressing   Problem: Fluid Volume: Goal: Ability to  maintain a balanced intake and output will improve Outcome: Progressing   Problem: Health Behavior/Discharge Planning: Goal: Ability to identify and utilize available resources and services will improve Outcome: Progressing Goal: Ability to manage health-related needs will improve Outcome: Progressing   Problem: Metabolic: Goal: Ability to maintain appropriate glucose levels will improve Outcome: Progressing   Problem: Nutritional: Goal: Maintenance of adequate nutrition will improve Outcome: Progressing Goal: Progress toward achieving an optimal weight will improve Outcome: Progressing   Problem: Skin Integrity: Goal: Risk for impaired skin integrity will decrease Outcome: Progressing   Problem: Tissue Perfusion: Goal: Adequacy of tissue perfusion will improve Outcome: Progressing   Problem: Education: Goal: Ability to verbalize activity precautions or restrictions will improve Outcome: Progressing Goal: Knowledge of the prescribed therapeutic regimen will improve Outcome: Progressing Goal: Understanding of discharge needs will improve Outcome: Progressing   Problem: Activity: Goal: Ability to avoid complications of mobility impairment will improve Outcome: Progressing Goal: Ability to tolerate increased activity will improve Outcome: Progressing Goal: Will remain free from falls Outcome: Progressing   Problem: Bowel/Gastric: Goal: Gastrointestinal status for postoperative course will improve Outcome: Progressing   Problem: Clinical Measurements: Goal: Ability to maintain clinical measurements within normal limits will improve Outcome: Progressing Goal: Postoperative complications will be avoided or minimized Outcome: Progressing Goal: Diagnostic test results will improve Outcome: Progressing   Problem: Pain Management: Goal: Pain level will decrease Outcome: Progressing   Problem: Skin Integrity: Goal: Will show signs of wound healing Outcome:  Progressing   Problem: Health Behavior/Discharge Planning: Goal: Identification of resources available to assist in meeting health care needs will improve Outcome: Progressing   Problem: Bladder/Genitourinary: Goal: Urinary functional status for postoperative course will improve Outcome: Progressing

## 2023-07-09 ENCOUNTER — Inpatient Hospital Stay (HOSPITAL_COMMUNITY): Payer: 59

## 2023-07-09 DIAGNOSIS — M7989 Other specified soft tissue disorders: Secondary | ICD-10-CM

## 2023-07-09 LAB — GLUCOSE, CAPILLARY
Glucose-Capillary: 109 mg/dL — ABNORMAL HIGH (ref 70–99)
Glucose-Capillary: 97 mg/dL (ref 70–99)
Glucose-Capillary: 129 mg/dL — ABNORMAL HIGH (ref 70–99)
Glucose-Capillary: 137 mg/dL — ABNORMAL HIGH (ref 70–99)

## 2023-07-09 MED ORDER — FLEET ENEMA RE ENEM
1.0000 | ENEMA | Freq: Once | RECTAL | Status: AC
  Administered 2023-07-09: 1 via RECTAL

## 2023-07-09 MED FILL — Thrombin For Soln Kit 20000 Unit: CUTANEOUS | Qty: 1 | Status: AC

## 2023-07-09 NOTE — Plan of Care (Signed)
  Problem: Education: Goal: Knowledge of General Education information will improve Description: Including pain rating scale, medication(s)/side effects and non-pharmacologic comfort measures Outcome: Completed/Met   Problem: Health Behavior/Discharge Planning: Goal: Ability to manage health-related needs will improve Outcome: Completed/Met   Problem: Clinical Measurements: Goal: Ability to maintain clinical measurements within normal limits will improve Outcome: Completed/Met Goal: Will remain free from infection Outcome: Completed/Met Goal: Diagnostic test results will improve Outcome: Completed/Met Goal: Respiratory complications will improve Outcome: Completed/Met Goal: Cardiovascular complication will be avoided Outcome: Completed/Met   Problem: Activity: Goal: Risk for activity intolerance will decrease Outcome: Completed/Met   Problem: Nutrition: Goal: Adequate nutrition will be maintained Outcome: Completed/Met   Problem: Coping: Goal: Level of anxiety will decrease Outcome: Completed/Met   Problem: Elimination: Goal: Will not experience complications related to bowel motility Outcome: Completed/Met Goal: Will not experience complications related to urinary retention Outcome: Completed/Met   Problem: Pain Management: Goal: General experience of comfort will improve Outcome: Completed/Met   Problem: Safety: Goal: Ability to remain free from injury will improve Outcome: Completed/Met   Problem: Skin Integrity: Goal: Risk for impaired skin integrity will decrease Outcome: Completed/Met   Problem: Education: Goal: Ability to verbalize activity precautions or restrictions will improve Outcome: Completed/Met Goal: Knowledge of the prescribed therapeutic regimen will improve Outcome: Completed/Met Goal: Understanding of discharge needs will improve Outcome: Completed/Met   Problem: Activity: Goal: Ability to avoid complications of mobility impairment will  improve Outcome: Completed/Met Goal: Ability to tolerate increased activity will improve Outcome: Completed/Met Goal: Will remain free from falls Outcome: Completed/Met   Problem: Bowel/Gastric: Goal: Gastrointestinal status for postoperative course will improve Outcome: Completed/Met   Problem: Clinical Measurements: Goal: Ability to maintain clinical measurements within normal limits will improve Outcome: Completed/Met Goal: Postoperative complications will be avoided or minimized Outcome: Completed/Met Goal: Diagnostic test results will improve Outcome: Completed/Met   Problem: Pain Management: Goal: Pain level will decrease Outcome: Completed/Met   Problem: Skin Integrity: Goal: Will show signs of wound healing Outcome: Completed/Met

## 2023-07-09 NOTE — Progress Notes (Addendum)
[facility]   Subjective  - very pleasant and positive   Objective (!) 141/74 85 98.1 F (36.7 C) (Oral) 20 100%  Intake/Output Summary (Last 24 hours) at 07/09/2023 1610 Last data filed at 07/08/2023 1500 Gross per 24 hour  Intake 2800 ml  Output 400 ml  Net 2400 ml    Abdomin soft, no flautus yet Ambulating with rolling walker in room DP palpable  Tolerating po's without N/V  Assessment/Planning: POD # 1 anterior exposure L5-S1  Abdomin soft with well healing incision Palpable pedal pulses No flatus currently Encouraged to mobilize as tolerates.    Mosetta Pigeon 07/09/2023 7:21 AM --  Laboratory Lab Results: Recent Labs    07/08/23 1730  WBC 14.6*  HGB 11.9*  HCT 36.8*  PLT 264   BMET Recent Labs    07/08/23 1730  CREATININE 1.45*    COAG No results found for: "INR", "PROTIME" No results found for: "PTT"   I have seen and evaluated the patient. I agree with the PA note as documented above.  Postop day 1 status post anterior spine exposure for L5-S1 ALIF.  Looks good from a vascular surgery standpoint.  Abdomen nice and soft.  Palpable pedal pulses.  We are available as needed.  Cephus Shelling, MD [facility] [place] Office: [phone]

## 2023-07-09 NOTE — Progress Notes (Signed)
    Subjective: Procedure(s) (LRB): Anterior lumbar interbody fusion L5-S1 with posterior supplemental fusion (N/A) POSTERIOR LUMBAR FUSION 1 LEVEL (N/A) ABDOMINAL EXPOSURE (N/A) 1 Day Post-Op  Patient reports pain as 2 on 0-10 scale.  Reports decreased leg pain Reports incisional back pain   Positive void Negative bowel movement Negative flatus Negative chest pain or shortness of breath  Objective: Vital signs in last 24 hours: Temp:  [97.8 F (36.6 C)-98.4 F (36.9 C)] 98.1 F (36.7 C) (12/17 0723) Pulse Rate:  [83-102] 95 (12/17 0723) Resp:  [10-20] 18 (12/17 0723) BP: (132-185)/(70-91) 159/87 (12/17 0723) SpO2:  [93 %-100 %] 100 % (12/17 0723)  Intake/Output from previous day: 12/16 0701 - 12/17 0700 In: 2800 [I.V.:1850; IV Piggyback:950] Out: 400 [Urine:250; Blood:150]  Labs: Recent Labs    07/08/23 1730  WBC 14.6*  RBC 4.39  HCT 36.8*  PLT 264   Recent Labs    07/08/23 1730  CREATININE 1.45*   No results for input(s): "LABPT", "INR" in the last 72 hours.  Physical Exam: Neurologically intact ABD soft Intact pulses distally Incision: dressing C/D/I and no drainage Compartment soft Body mass index is 34.02 kg/m.   Assessment/Plan: Patient stable  Continue mobilization with physical therapy Continue care  Doppler today.  Will start lovenox today PT eval today Mag citrate for constipation D/C tomorrow after cleared by PT and positive BM  Venita Lick, MD [facility] [phone]

## 2023-07-09 NOTE — Evaluation (Signed)
Physical Therapy Evaluation  Patient Details Name: Jeffery [name] MRN: [id number] DOB: 1960/10/13 Today's Date: 07/09/2023  History of Present Illness  Pt is a 62 y/o male who presents s/p L5-S1 ALIF on 07/08/23. PMH significant for DM, HTN, hyperthyroidism, C5-C6 cervical disc arthroplasty, B shoulder surgery L in 2008 R 2012.   Clinical Impression  Pt admitted with above diagnosis. At the time of PT eval, pt was able to demonstrate transfers and ambulation with gross supervision for safety to CGA and RW for support. Pt was educated on precautions, brace application/wearing schedule, appropriate activity progression, and car transfer. Pt currently with functional limitations due to the deficits listed below (see PT Problem List). Pt will benefit from skilled PT to increase their independence and safety with mobility to allow discharge to the venue listed below.          If plan is discharge home, recommend the following: A little help with walking and/or transfers;A little help with bathing/dressing/bathroom;Assistance with cooking/housework;Assist for transportation;Help with stairs or ramp for entrance   Can travel by private vehicle        Equipment Recommendations Rolling walker (2 wheels)  Recommendations for Other Services       Functional Status Assessment Patient has had a recent decline in their functional status and demonstrates the ability to make significant improvements in function in a reasonable and predictable amount of time.     Precautions / Restrictions Precautions Precautions: Fall;Back Precaution Booklet Issued: Yes (comment) Precaution Comments: Reviewed handout and pt was cued for precautions during functional mobility. Required Braces or Orthoses: Spinal Brace Spinal Brace: Applied in sitting position Restrictions Weight Bearing Restrictions Per Provider Order: No      Mobility  Bed Mobility Overal bed mobility: Needs Assistance Bed Mobility:  Rolling, Sidelying to Sit, Sit to Sidelying Rolling: Supervision Sidelying to sit: Supervision     Sit to sidelying: Supervision General bed mobility comments: HOB flat and rails lowered to simulate home environment.    Transfers Overall transfer level: Needs assistance Equipment used: Rolling walker (2 wheels) Transfers: Sit to/from Stand Sit to Stand: Supervision           General transfer comment: VC's for hand placement on seated surface for safety. No assist required but close supervision provided for safety.    Ambulation/Gait Ambulation/Gait assistance: Supervision Gait Distance (Feet): 500 Feet Assistive device: Rolling walker (2 wheels) Gait Pattern/deviations: Step-through pattern, Decreased stride length, Trunk flexed Gait velocity: Decreased Gait velocity interpretation: 1.31 - 2.62 ft/sec, indicative of limited community ambulator   General Gait Details: VC's throughout for improved posture, closer walker proximity and forward gaze. No overt LOB noted but pt ambulating slow and guarded.  Stairs            Wheelchair Mobility     Tilt Bed    Modified Rankin (Stroke Patients Only)       Balance Overall balance assessment: Needs assistance Sitting-balance support: Feet supported Sitting balance-Leahy Scale: Good     Standing balance support: Single extremity supported, During functional activity Standing balance-Leahy Scale: Fair Standing balance comment: statically                             Pertinent Vitals/Pain Pain Assessment Pain Assessment: Faces Faces Pain Scale: Hurts even more Pain Location: back Pain Descriptors / Indicators: Discomfort, Grimacing, Guarding Pain Intervention(s): Limited activity within patient's tolerance, Monitored during session, Repositioned, Ice applied    Home Living  Family/patient expects to be discharged to:: Private residence Living Arrangements: Spouse/significant other Available Help at  Discharge: Family;Available 24 hours/day Type of Home: House Home Access: Stairs to enter   [employer] of Steps: 1   Home Layout: One level Home Equipment: Shower seat;Toilet riser;Adaptive equipment      Prior Function Prior Level of Function : Independent/Modified Independent;Working/employed;Driving             Mobility Comments: no AD ADLs Comments: indep, works as an English as a second language teacher Upper Extremity Assessment: Overall WFL for tasks assessed    Lower Extremity Assessment Lower Extremity Assessment: Generalized weakness (Mild; consistent with pre-op diagnosis)    Cervical / Trunk Assessment Cervical / Trunk Assessment: Back Surgery  Communication   Communication Communication: No apparent difficulties  Cognition Arousal: Alert Behavior During Therapy: WFL for tasks assessed/performed Overall Cognitive Status: Within Functional Limits for tasks assessed                                          General Comments      Exercises     Assessment/Plan    PT Assessment Patient needs continued PT services  PT Problem List Decreased strength;Decreased activity tolerance;Decreased balance;Decreased mobility;Decreased knowledge of use of DME;Decreased safety awareness;Decreased knowledge of precautions;Pain       PT Treatment Interventions DME instruction;Gait training;Stair training;Functional mobility training;Therapeutic activities;Therapeutic exercise;Balance training;Patient/family education    PT Goals (Current goals can be found in the Care Plan section)  Acute Rehab PT Goals Patient Stated Goal: Home at d/c PT Goal Formulation: With patient/family Time For Goal Achievement: 07/16/23 Potential to Achieve Goals: Good    Frequency Min 5X/week     Co-evaluation               AM-PAC PT "6 Clicks" Mobility  Outcome Measure Help needed turning from your back to  your side while in a flat bed without using bedrails?: A Little Help needed moving from lying on your back to sitting on the side of a flat bed without using bedrails?: A Little Help needed moving to and from a bed to a chair (including a wheelchair)?: A Little Help needed standing up from a chair using your arms (e.g., wheelchair or bedside chair)?: A Little Help needed to walk in hospital room?: A Little Help needed climbing 3-5 steps with a railing? : A Little 6 Click Score: 18    End of Session Equipment Utilized During Treatment: Gait belt Activity Tolerance: Patient tolerated treatment well Patient left: in bed;with call bell/phone within reach;with family/visitor present Nurse Communication: Mobility status PT Visit Diagnosis: Unsteadiness on feet (R26.81);Pain Pain - part of body:  (back)    Time: 4098-1191 PT Time Calculation (min) (ACUTE ONLY): 28 min   Charges:   PT Evaluation $PT Eval Low Complexity: 1 Low PT Treatments $Gait Training: 8-22 mins PT General Charges $$ ACUTE PT VISIT: 1 Visit         Conni Slipper, PT, DPT Acute Rehabilitation Services Secure Chat Preferred Office: [phone]   Marylynn Pearson 07/09/2023, 3:30 PM

## 2023-07-09 NOTE — Evaluation (Signed)
Occupational Therapy Evaluation Patient Details Name: Jeffery [name] MRN: [id number] DOB: 27-Dec-1960 Today's Date: 07/09/2023   History of Present Illness Jeffery [name]  62 y.o. male who underwent anterior lumbar interbody fusion at L5-S1 12/16. PMHx: DM, fatty liver, GERD, HTN, hyperthyroidism, lipoma   Clinical Impression   Jeffery [name] was evaluated s/p the above admission list. He is indep, lives with family and works as an Advertising account planner at baseline. Upon evaluation the pt was limited by spinal precautions, surgical pain, generalized weakness and limited activity tolerance. Overall he needed up to [facility] for mobility with RW and cues for safety. Due to the deficits listed below the pt also needs up to min A for LB ADLs - discussed AE use to incr indep and safety, pt had great recall. Pt will benefit from continued acute OT services and discharge home with support of family as needed.         If plan is discharge home, recommend the following: A little help with bathing/dressing/bathroom;Assistance with cooking/housework;Help with stairs or ramp for entrance;Assist for transportation    Functional Status Assessment  Patient has had a recent decline in their functional status and demonstrates the ability to make significant improvements in function in a reasonable and predictable amount of time.  Equipment Recommendations  Other (comment) (RW)       Precautions / Restrictions Precautions Precautions: Fall;Back Precaution Booklet Issued: Yes (comment) Required Braces or Orthoses: Spinal Brace Spinal Brace: Applied in sitting position Restrictions Weight Bearing Restrictions Per Provider Order: No      Mobility Bed Mobility Overal bed mobility: Needs Assistance Bed Mobility: Rolling, Sidelying to Sit, Sit to Sidelying Rolling: Supervision Sidelying to sit: Supervision     Sit to sidelying: Supervision General bed mobility comments: great log roll without cues     Transfers Overall transfer level: Needs assistance Equipment used: Rolling walker (2 wheels) Transfers: Sit to/from Stand Sit to Stand: Supervision           General transfer comment: incr time      Balance Overall balance assessment: Needs assistance Sitting-balance support: Feet supported Sitting balance-Leahy Scale: Good     Standing balance support: Single extremity supported, During functional activity Standing balance-Leahy Scale: Fair Standing balance comment: statically           ADL either performed or assessed with clinical judgement   ADL Overall ADL's : Needs assistance/impaired Eating/Feeding: Independent   Grooming: Supervision/safety   Upper Body Bathing: Set up   Lower Body Bathing: Contact guard assist   Upper Body Dressing : Set up   Lower Body Dressing: Minimal assistance   Toilet Transfer: Contact guard assist;Ambulation   Toileting- Clothing Manipulation and Hygiene: Supervision/safety;Sitting/lateral lean;Sit to/from stand       Functional mobility during ADLs: Contact guard assist General ADL Comments: min A for LB ADLs to prevent bending. AE at home to incr indep     Vision Baseline Vision/History: 0 No visual deficits;1 Wears glasses Vision Assessment?: No apparent visual deficits     Perception Perception: Within Functional Limits       Praxis Praxis: WFL       Pertinent Vitals/Pain Pain Assessment Pain Assessment: Faces Faces Pain Scale: Hurts little more Pain Location: back Pain Descriptors / Indicators: Discomfort, Grimacing, Guarding Pain Intervention(s): Limited activity within patient's tolerance, Monitored during session     Extremity/Trunk Assessment Upper Extremity Assessment Upper Extremity Assessment: Overall WFL for tasks assessed   Lower Extremity Assessment Lower Extremity Assessment: Defer to PT  evaluation   Cervical / Trunk Assessment Cervical / Trunk Assessment: Back Surgery    Communication Communication Communication: No apparent difficulties   Cognition Arousal: Alert Behavior During Therapy: WFL for tasks assessed/performed Overall Cognitive Status: Within Functional Limits for tasks assessed             General Comments: great recall and attention to spinal precautions     General Comments  VSS            Home Living Family/patient expects to be discharged to:: Private residence Living Arrangements: Spouse/significant other Available Help at Discharge: Family;Available 24 hours/day Type of Home: House Home Access: Stairs to enter [employer] of Steps: 1   Home Layout: One level     Bathroom Shower/Tub: Chief Strategy Officer: Standard     Home Equipment: Shower seat;Toilet riser;Adaptive equipment Adaptive Equipment: Reacher        Prior Functioning/Environment Prior Level of Function : Independent/Modified Independent;Working/employed;Driving             Mobility Comments: no AD ADLs Comments: indep, works as an IT trainer Problem List: Decreased strength;Decreased range of motion;Decreased activity tolerance;Impaired balance (sitting and/or standing);Pain;Decreased safety awareness;Decreased knowledge of use of DME or AE;Decreased knowledge of precautions      OT Treatment/Interventions: Self-care/ADL training;Therapeutic exercise;DME and/or AE instruction;Therapeutic activities;Patient/family education;Balance training    OT Goals(Current goals can be found in the care plan section) Acute Rehab OT Goals Patient Stated Goal: home OT Goal Formulation: With patient Time For Goal Achievement: 07/23/23 Potential to Achieve Goals: Good ADL Goals Pt Will Perform Lower Body Dressing: with modified independence;sit to/from stand;with adaptive equipment Pt Will Transfer to Toilet: with modified independence;ambulating Additional ADL Goal #1: Pt will indep maintain back precautions  during ADLs  OT Frequency: Min 1X/week       AM-PAC OT "6 Clicks" Daily Activity     Outcome Measure Help from another person eating meals?: None Help from another person taking care of personal grooming?: A Little Help from another person toileting, which includes using toliet, bedpan, or urinal?: A Little Help from another person bathing (including washing, rinsing, drying)?: A Little Help from another person to put on and taking off regular upper body clothing?: A Little Help from another person to put on and taking off regular lower body clothing?: A Little 6 Click Score: 19   End of Session Equipment Utilized During Treatment: Rolling walker (2 wheels);Back brace Nurse Communication: Mobility status  Activity Tolerance: Patient tolerated treatment well Patient left: in bed;with call bell/phone within reach  OT Visit Diagnosis: Unsteadiness on feet (R26.81);Other abnormalities of gait and mobility (R26.89);Muscle weakness (generalized) (M62.81);Pain                Time: 4098-1191 OT Time Calculation (min): 25 min Charges:  OT General Charges $OT Visit: 1 Visit OT Evaluation $OT Eval Moderate Complexity: 1 Mod OT Treatments $Self Care/Home Management : 8-22 mins  Derenda Mis, OTR/L Acute Rehabilitation Services Office [phone] Secure Chat Communication Preferred   Jeffery [name] 07/09/2023, 10:55 AM

## 2023-07-10 ENCOUNTER — Inpatient Hospital Stay (HOSPITAL_COMMUNITY): Payer: 59

## 2023-07-10 ENCOUNTER — Encounter (HOSPITAL_COMMUNITY): Payer: Self-pay | Admitting: Orthopedic Surgery

## 2023-07-10 LAB — GLUCOSE, CAPILLARY
Glucose-Capillary: 138 mg/dL — ABNORMAL HIGH (ref 70–99)
Glucose-Capillary: 123 mg/dL — ABNORMAL HIGH (ref 70–99)
Glucose-Capillary: 129 mg/dL — ABNORMAL HIGH (ref 70–99)
Glucose-Capillary: 152 mg/dL — ABNORMAL HIGH (ref 70–99)

## 2023-07-10 MED ORDER — ACETAMINOPHEN 500 MG PO TABS
1000.0000 mg | ORAL_TABLET | Freq: Four times a day (QID) | ORAL | Status: DC
Start: 2023-07-10 — End: 2023-07-12
  Administered 2023-07-10 – 2023-07-12 (×6): 1000 mg via ORAL
  Filled 2023-07-10 (×6): qty 2

## 2023-07-10 MED ORDER — SORBITOL 70 % SOLN
15.0000 mL | Freq: Every day | Status: DC | PRN
  Administered 2023-07-10: 15 mL via ORAL
  Filled 2023-07-10 (×2): qty 30

## 2023-07-10 MED ORDER — BISACODYL 10 MG RE SUPP
10.0000 mg | Freq: Every day | RECTAL | Status: DC | PRN
  Administered 2023-07-10: 10 mg via RECTAL
  Filled 2023-07-10 (×2): qty 1

## 2023-07-10 MED ORDER — FLEET ENEMA RE ENEM
1.0000 | ENEMA | Freq: Every day | RECTAL | Status: DC | PRN
  Administered 2023-07-10: 1 via RECTAL
  Filled 2023-07-10: qty 1

## 2023-07-10 NOTE — Progress Notes (Signed)
Occupational Therapy Treatment Patient Details Name: Jeffery [name] MRN: [id number] DOB: 1960-10-17 Today's Date: 07/10/2023   History of present illness Pt is a 62 y/o male who presents s/p L5-S1 ALIF on 07/08/23. PMH significant for DM, HTN, hyperthyroidism, C5-C6 cervical disc arthroplasty, B shoulder surgery L in 2008 R 2012.   OT comments  Pt has met his acute OT goals although remains limited by surgical pain and general malaise due to his inability to have a BM. He demonstrated mod I ability to complete bed mobility, transfers and functional mobility with RW. He continues to need minimal assist for LB ADLs, discussed importance of AE use at home to increase safety and indep and pt has great recall. Pt does not need further acute OT services, continue to recommend frequent mobility and indep ADLs while in acute care. Pt will continue to benefit from discharge home with support from family as needed.       If plan is discharge home, recommend the following:  A little help with bathing/dressing/bathroom;Assistance with cooking/housework;Help with stairs or ramp for entrance;Assist for transportation   Equipment Recommendations  Other (comment)       Precautions / Restrictions Precautions Precautions: Fall;Back Precaution Booklet Issued: Yes (comment) Precaution Comments: Reviewed handout and pt was cued for precautions during functional mobility. Required Braces or Orthoses: Spinal Brace Spinal Brace: Applied in sitting position Restrictions Weight Bearing Restrictions Per Provider Order: No       Mobility Bed Mobility Overal bed mobility: Needs Assistance Bed Mobility: Rolling, Sidelying to Sit, Sit to Sidelying Rolling: Modified independent (Device/Increase time) Sidelying to sit: Modified independent (Device/Increase time)     Sit to sidelying: Modified independent (Device/Increase time)      Transfers Overall transfer level: Needs assistance Equipment used:  Rolling walker (2 wheels) Transfers: Sit to/from Stand Sit to Stand: Modified independent (Device/Increase time)           General transfer comment: several STS during ADLs     Balance Overall balance assessment: Needs assistance Sitting-balance support: Feet supported Sitting balance-Leahy Scale: Good     Standing balance support: Single extremity supported, During functional activity Standing balance-Leahy Scale: Fair                             ADL either performed or assessed with clinical judgement   ADL Overall ADL's : Needs assistance/impaired     Grooming: Modified independent;Standing Grooming Details (indicate cue type and reason): at the sink         Upper Body Dressing : Set up   Lower Body Dressing: Contact guard assist;Sit to/from stand Lower Body Dressing Details (indicate cue type and reason): continued to educate on use of AE, pt has reacher at home Toilet Transfer: Modified Independent;Ambulation;Rolling walker (2 wheels);Regular Toilet           Functional mobility during ADLs: Modified independent;Rolling walker (2 wheels) General ADL Comments: no assist or safety concerns noted with mobility, cues for LB ADLs for use of AE. Pt has great understanding    Extremity/Trunk Assessment Upper Extremity Assessment Upper Extremity Assessment: Overall WFL for tasks assessed   Lower Extremity Assessment Lower Extremity Assessment: Defer to PT evaluation        Vision   Vision Assessment?: No apparent visual deficits   Perception Perception Perception: Within Functional Limits   Praxis Praxis Praxis: WFL    Cognition Arousal: Alert Behavior During Therapy: WFL for tasks assessed/performed Overall Cognitive  Status: Within Functional Limits for tasks assessed           General Comments: great recall and attention to spinal precautions, flat affect, general malaise due to inability to have BM              General Comments  VSS, family present    Pertinent Vitals/ Pain       Pain Assessment Pain Assessment: Faces Faces Pain Scale: Hurts even more Pain Location: back Pain Descriptors / Indicators: Discomfort, Grimacing, Guarding Pain Intervention(s): Limited activity within patient's tolerance, Monitored during session         Frequency  Min 1X/week        Progress Toward Goals  OT Goals(current goals can now be found in the care plan section)  Progress towards OT goals: Progressing toward goals  Acute Rehab OT Goals Patient Stated Goal: to have BM OT Goal Formulation: With patient Time For Goal Achievement: 07/23/23 Potential to Achieve Goals: Good ADL Goals Pt Will Perform Lower Body Dressing: with modified independence;sit to/from stand;with adaptive equipment Pt Will Transfer to Toilet: with modified independence;ambulating Additional ADL Goal #1: Pt will indep maintain back precautions during ADLs         AM-PAC OT "6 Clicks" Daily Activity     Outcome Measure   Help from another person eating meals?: None Help from another person taking care of personal grooming?: None Help from another person toileting, which includes using toliet, bedpan, or urinal?: None Help from another person bathing (including washing, rinsing, drying)?: None Help from another person to put on and taking off regular upper body clothing?: None Help from another person to put on and taking off regular lower body clothing?: A Lot 6 Click Score: 22    End of Session Equipment Utilized During Treatment: Rolling walker (2 wheels);Back brace  OT Visit Diagnosis: Unsteadiness on feet (R26.81);Other abnormalities of gait and mobility (R26.89);Muscle weakness (generalized) (M62.81);Pain   Activity Tolerance Patient tolerated treatment well   Patient Left in bed;with call bell/phone within reach   Nurse Communication Mobility status        Time: 2956-2130 OT Time Calculation (min): 16 min  Charges: OT  General Charges $OT Visit: 1 Visit OT Treatments $Self Care/Home Management : 8-22 mins  Derenda Mis, OTR/L Acute Rehabilitation Services Office [phone] Secure Chat Communication Preferred   Donia Pounds 07/10/2023, 10:39 AM

## 2023-07-10 NOTE — Progress Notes (Signed)
Physical Therapy Treatment  Patient Details Name: Jeffery [name] MRN: [id number] DOB: 1960-11-22 Today's Date: 07/10/2023   History of Present Illness Pt is a 62 y/o male who presents s/p L5-S1 ALIF on 07/08/23. PMH significant for DM, HTN, hyperthyroidism, C5-C6 cervical disc arthroplasty, B shoulder surgery L in 2008 R 2012.    PT Comments  Pt progressing well with post-op mobility. He was able to demonstrate transfers and ambulation with gross modified independence to supervision for safety and RW fro support. Pt's main concern is having a BM. Reports passing gas and feeling cramping in abdomen. RN aware. Reinforced education on precautions, brace application/wearing schedule, appropriate activity progression, and car transfer. Will continue to follow.      If plan is discharge home, recommend the following: A little help with walking and/or transfers;A little help with bathing/dressing/bathroom;Assistance with cooking/housework;Assist for transportation;Help with stairs or ramp for entrance   Can travel by private vehicle        Equipment Recommendations  Rolling walker (2 wheels)    Recommendations for Other Services       Precautions / Restrictions Precautions Precautions: Fall;Back Precaution Booklet Issued: Yes (comment) Precaution Comments: Reviewed handout and pt was cued for precautions during functional mobility. Required Braces or Orthoses: Spinal Brace Spinal Brace: Applied in sitting position Restrictions Weight Bearing Restrictions Per Provider Order: No     Mobility  Bed Mobility Overal bed mobility: Needs Assistance Bed Mobility: Rolling, Sidelying to Sit, Sit to Sidelying Rolling: Modified independent (Device/Increase time) Sidelying to sit: Modified independent (Device/Increase time)       General bed mobility comments: Pt demonstrated spontaneously upon PT arrival. No assist required, min use of rails noted.    Transfers Overall transfer level:  Needs assistance Equipment used: Rolling walker (2 wheels) Transfers: Sit to/from Stand Sit to Stand: Modified independent (Device/Increase time)           General transfer comment: No assist. Pt demonstrated proper hand placement on seated surface for safety.    Ambulation/Gait Ambulation/Gait assistance: Supervision Gait Distance (Feet): 500 Feet Assistive device: Rolling walker (2 wheels) Gait Pattern/deviations: Step-through pattern, Decreased stride length, Trunk flexed Gait velocity: Decreased Gait velocity interpretation: 1.31 - 2.62 ft/sec, indicative of limited community ambulator   General Gait Details: VC's throughout for improved posture, closer walker proximity and forward gaze. No overt LOB noted but pt ambulating slow and guarded.   Stairs             Wheelchair Mobility     Tilt Bed    Modified Rankin (Stroke Patients Only)       Balance Overall balance assessment: Needs assistance Sitting-balance support: Feet supported Sitting balance-Leahy Scale: Good     Standing balance support: Single extremity supported, During functional activity Standing balance-Leahy Scale: Fair Standing balance comment: statically                            Cognition Arousal: Alert Behavior During Therapy: WFL for tasks assessed/performed Overall Cognitive Status: Within Functional Limits for tasks assessed                                          Exercises      General Comments General comments (skin integrity, edema, etc.): VSS, family present      Pertinent Vitals/Pain Pain Assessment Pain Assessment: Faces Faces Pain  Scale: Hurts even more Pain Location: back Pain Descriptors / Indicators: Discomfort, Grimacing, Guarding Pain Intervention(s): Limited activity within patient's tolerance, Monitored during session, Repositioned    Home Living                          Prior Function            PT Goals  (current goals can now be found in the care plan section) Acute Rehab PT Goals Patient Stated Goal: Home at d/c, have a BM PT Goal Formulation: With patient/family Time For Goal Achievement: 07/16/23 Potential to Achieve Goals: Good Progress towards PT goals: Progressing toward goals    Frequency    Min 5X/week      PT Plan      Co-evaluation              AM-PAC PT "6 Clicks" Mobility   Outcome Measure  Help needed turning from your back to your side while in a flat bed without using bedrails?: A Little Help needed moving from lying on your back to sitting on the side of a flat bed without using bedrails?: A Little Help needed moving to and from a bed to a chair (including a wheelchair)?: A Little Help needed standing up from a chair using your arms (e.g., wheelchair or bedside chair)?: A Little Help needed to walk in hospital room?: A Little Help needed climbing 3-5 steps with a railing? : A Little 6 Click Score: 18    End of Session Equipment Utilized During Treatment: Gait belt Activity Tolerance: Patient tolerated treatment well Patient left: in bed;with call bell/phone within reach;with family/visitor present Nurse Communication: Mobility status PT Visit Diagnosis: Unsteadiness on feet (R26.81);Pain Pain - part of body:  (back)     Time: 8295-6213 PT Time Calculation (min) (ACUTE ONLY): 17 min  Charges:    $Gait Training: 8-22 mins PT General Charges $$ ACUTE PT VISIT: 1 Visit                     Conni Slipper, PT, DPT Acute Rehabilitation Services Secure Chat Preferred Office: [phone]    Jeffery [name] 07/10/2023, 11:30 AM

## 2023-07-10 NOTE — Progress Notes (Signed)
    Subjective: Procedure(s) (LRB): Anterior lumbar interbody fusion L5-S1 with posterior supplemental fusion (N/A) POSTERIOR LUMBAR FUSION 1 LEVEL (N/A) ABDOMINAL EXPOSURE (N/A) 2 Days Post-Op  Patient reports pain as 3 on 0-10 scale.  Reports decreased leg pain reports incisional back pain   Positive void Negative bowel movement Negative flatus Negative chest pain or shortness of breath  Objective: Vital signs in last 24 hours: Temp:  [98 F (36.7 C)-100.5 F (38.1 C)] 100.5 F (38.1 C) (12/18 0630) Pulse Rate:  [89-118] 118 (12/18 0008) Resp:  [18-20] 20 (12/18 0630) BP: (127-186)/(71-97) 127/71 (12/18 0630) SpO2:  [98 %-100 %] 100 % (12/18 0630)  Intake/Output from previous day: 12/17 0701 - 12/18 0700 In: 240 [P.O.:240] Out: -   Labs: Recent Labs    07/08/23 1730  WBC 14.6*  RBC 4.39  HCT 36.8*  PLT 264   Recent Labs    07/08/23 1730  CREATININE 1.45*   No results for input(s): "LABPT", "INR" in the last 72 hours.  Physical Exam: Neurologically intact ABD soft Intact pulses distally Incision: dressing C/D/I Compartment soft Body mass index is 34.02 kg/m.   Assessment/Plan: Patient stable  DVT scan negative Continue mobilization with physical therapy Continue care  Patient does have constipation despite appropriate treatment.  Will continue to monitor.  Plan on discharge home once he has either had positive flatus or bowel movement.  No distention of the abdomen but will check a KUB today to ensure there is no evidence of a ileus.  Venita Lick, MD [facility] [phone]

## 2023-07-11 ENCOUNTER — Encounter (HOSPITAL_COMMUNITY): Payer: Self-pay | Admitting: Orthopedic Surgery

## 2023-07-11 LAB — GLUCOSE, CAPILLARY
Glucose-Capillary: 126 mg/dL — ABNORMAL HIGH (ref 70–99)
Glucose-Capillary: 116 mg/dL — ABNORMAL HIGH (ref 70–99)
Glucose-Capillary: 111 mg/dL — ABNORMAL HIGH (ref 70–99)
Glucose-Capillary: 123 mg/dL — ABNORMAL HIGH (ref 70–99)

## 2023-07-11 MED ORDER — PEG 3350-KCL-NA BICARB-NACL 420 G PO SOLR
4000.0000 mL | Freq: Once | ORAL | Status: AC
  Administered 2023-07-11: 4000 mL via ORAL
  Filled 2023-07-11: qty 4000

## 2023-07-11 MED FILL — Sodium Chloride IV Soln 0.9%: INTRAVENOUS | Qty: 1000 | Status: AC

## 2023-07-11 MED FILL — Heparin Sodium (Porcine) Inj 1000 Unit/ML: INTRAMUSCULAR | Qty: 30 | Status: AC

## 2023-07-11 NOTE — Progress Notes (Signed)
Physical Therapy Treatment  Patient Details Name: Jeffery [name] MRN: [id number] DOB: Oct 21, 1960 Today's Date: 07/11/2023   History of Present Illness Pt is a 62 y/o male who presents s/p L5-S1 ALIF on 07/08/23. PMH significant for DM, HTN, hyperthyroidism, C5-C6 cervical disc arthroplasty, B shoulder surgery L in 2008 R 2012.    PT Comments  Pt progressing well with post-op mobility. He was able to demonstrate transfers and ambulation with gross modified independence. Reinforced education on precautions, brace application/wearing schedule, appropriate activity progression, and car transfer. Pt has met acute PT goals. Will sign off at this time. If needs change, please reconsult.      If plan is discharge home, recommend the following: A little help with walking and/or transfers;A little help with bathing/dressing/bathroom;Assistance with cooking/housework;Assist for transportation;Help with stairs or ramp for entrance   Can travel by private vehicle        Equipment Recommendations  Rolling walker (2 wheels)    Recommendations for Other Services       Precautions / Restrictions Precautions Precautions: Fall;Back Precaution Booklet Issued: Yes (comment) Precaution Comments: Reviewed handout and pt was cued for precautions during functional mobility. Required Braces or Orthoses: Spinal Brace Spinal Brace: Lumbar corset;Applied in sitting position Restrictions Weight Bearing Restrictions Per Provider Order: No     Mobility  Bed Mobility Overal bed mobility: Modified Independent Bed Mobility: Rolling, Sidelying to Sit, Sit to Sidelying           General bed mobility comments: HOB flat and rails lowered to simulate home environment.    Transfers Overall transfer level: Needs assistance Equipment used: Rolling walker (2 wheels) Transfers: Sit to/from Stand Sit to Stand: Modified independent (Device/Increase time)           General transfer comment: No assist. Pt  demonstrated proper hand placement on seated surface for safety.    Ambulation/Gait Ambulation/Gait assistance: Modified independent (Device/Increase time) Gait Distance (Feet): 500 Feet Assistive device: Rolling walker (2 wheels) Gait Pattern/deviations: Step-through pattern, Decreased stride length, Trunk flexed Gait velocity: Decreased Gait velocity interpretation: 1.31 - 2.62 ft/sec, indicative of limited community ambulator   General Gait Details: Good posture. No unsteadiness or LOB noted.   Stairs             Wheelchair Mobility     Tilt Bed    Modified Rankin (Stroke Patients Only)       Balance Overall balance assessment: Needs assistance Sitting-balance support: Feet supported Sitting balance-Leahy Scale: Good     Standing balance support: Single extremity supported, During functional activity Standing balance-Leahy Scale: Fair                              Cognition Arousal: Alert Behavior During Therapy: WFL for tasks assessed/performed Overall Cognitive Status: Within Functional Limits for tasks assessed                                          Exercises      General Comments        Pertinent Vitals/Pain Pain Assessment Pain Assessment: Faces Faces Pain Scale: Hurts even more Pain Location: back Pain Descriptors / Indicators: Discomfort, Grimacing, Guarding Pain Intervention(s): Monitored during session, Limited activity within patient's tolerance, Repositioned    Home Living  Prior Function            PT Goals (current goals can now be found in the care plan section) Acute Rehab PT Goals Patient Stated Goal: Have a BM and go home today PT Goal Formulation: With patient/family Time For Goal Achievement: 07/16/23 Potential to Achieve Goals: Good Progress towards PT goals: Progressing toward goals    Frequency    Min 5X/week      PT Plan       Co-evaluation              AM-PAC PT "6 Clicks" Mobility   Outcome Measure  Help needed turning from your back to your side while in a flat bed without using bedrails?: None Help needed moving from lying on your back to sitting on the side of a flat bed without using bedrails?: None Help needed moving to and from a bed to a chair (including a wheelchair)?: None Help needed standing up from a chair using your arms (e.g., wheelchair or bedside chair)?: None Help needed to walk in hospital room?: None Help needed climbing 3-5 steps with a railing? : None 6 Click Score: 24    End of Session Equipment Utilized During Treatment: Gait belt Activity Tolerance: Patient tolerated treatment well Patient left: in bed;with call bell/phone within reach;with family/visitor present Nurse Communication: Mobility status PT Visit Diagnosis: Unsteadiness on feet (R26.81);Pain Pain - part of body:  (back)     Time: 8119-1478 PT Time Calculation (min) (ACUTE ONLY): 31 min  Charges:    $Gait Training: 23-37 mins PT General Charges $$ ACUTE PT VISIT: 1 Visit                     Conni Slipper, PT, DPT Acute Rehabilitation Services Secure Chat Preferred Office: [phone]    Marylynn Pearson 07/11/2023, 10:41 AM

## 2023-07-11 NOTE — Progress Notes (Signed)
    Subjective: Procedure(s) (LRB): Anterior lumbar interbody fusion L5-S1 with posterior supplemental fusion (N/A) POSTERIOR LUMBAR FUSION 1 LEVEL (N/A) ABDOMINAL EXPOSURE (N/A) 3 Days Post-Op  Patient reports pain as 2 on 0-10 scale.  Reports decreased leg pain reports incisional back pain   Positive void Negative bowel movement Positive flatus Negative chest pain or shortness of breath  Objective: Vital signs in last 24 hours: Temp:  [98 F (36.7 C)-100.9 F (38.3 C)] 98.6 F (37 C) (12/19 0736) Pulse Rate:  [97-112] 97 (12/19 0736) Resp:  [14-18] 18 (12/19 0736) BP: (117-172)/(74-84) 117/76 (12/19 0736) SpO2:  [95 %-100 %] 100 % (12/19 0736)  Intake/Output from previous day: 12/18 0701 - 12/19 0700 In: 240 [P.O.:240] Out: -   Labs: Recent Labs    07/08/23 1730  WBC 14.6*  RBC 4.39  HCT 36.8*  PLT 264   Recent Labs    07/08/23 1730  CREATININE 1.45*   No results for input(s): "LABPT", "INR" in the last 72 hours.  Physical Exam: Neurologically intact ABD soft Intact pulses distally Incision: dressing C/D/I and no drainage Compartment soft Body mass index is 34.02 kg/m.   Assessment/Plan: Patient stable  Xray:  Positive stool in colon.  No evidence of ileus Continue mobilization with physical therapy Continue care  Plan for discharge tomorrow or today if patient has positive BM.  Concern about d/c to home prior as he could develop an ileus which would result in re-admission.    Venita Lick, MD [facility] [phone]

## 2023-07-12 LAB — GLUCOSE, CAPILLARY: Glucose-Capillary: 112 mg/dL — ABNORMAL HIGH (ref 70–99)

## 2023-07-12 MED ORDER — ONDANSETRON HCL 4 MG PO TABS: 4.0000 mg | ORAL_TABLET | Freq: Three times a day (TID) | ORAL | 0 refills | Status: AC | PRN

## 2023-07-12 MED ORDER — ENOXAPARIN SODIUM 40 MG/0.4ML IJ SOSY: 40.0000 mg | PREFILLED_SYRINGE | INTRAMUSCULAR | 0 refills | Status: AC

## 2023-07-12 MED ORDER — METHOCARBAMOL 500 MG PO TABS: 500.0000 mg | ORAL_TABLET | Freq: Three times a day (TID) | ORAL | 0 refills | Status: AC | PRN

## 2023-07-12 MED ORDER — OXYCODONE-ACETAMINOPHEN 10-325 MG PO TABS: 1.0000 | ORAL_TABLET | Freq: Four times a day (QID) | ORAL | 0 refills | Status: AC | PRN

## 2023-07-12 NOTE — Progress Notes (Signed)
Patient alert and oriented, ambulate. Surgical site clean and dry no sign of infection. D/c instructions explain and given to the patient and family.

## 2023-07-12 NOTE — Discharge Instructions (Signed)

## 2023-07-12 NOTE — Discharge Summary (Signed)
Patient ID: KOLETON ARCIA MRN: [id number] DOB/AGE: January 14, 1961 62 y.o.  Admit date: 07/08/2023 Discharge date: 07/12/2023  Admission Diagnoses:  Principal Problem:   S/P lumbar fusion   Discharge Diagnoses:  Principal Problem:   S/P lumbar fusion  status post Procedure(s): Anterior lumbar interbody fusion L5-S1 with posterior supplemental fusion POSTERIOR LUMBAR FUSION 1 LEVEL ABDOMINAL EXPOSURE  Past Medical History:  Diagnosis Date   Anal fissure    Diabetes mellitus without complication (HCC)    Type 2   Fatty liver    GERD (gastroesophageal reflux disease)    Hyperplastic rectal polyp    Hypertension    Hyperthyroidism    Lipoma    Lumbar degenerative disc disease     Surgeries: Procedure(s): Anterior lumbar interbody fusion L5-S1 with posterior supplemental fusion POSTERIOR LUMBAR FUSION 1 LEVEL ABDOMINAL EXPOSURE on 07/08/2023   Consultants: Treatment Team:  Victorino Sparrow, MD  Discharged Condition: Improved  Hospital Course: QUANTA HAFER is an 62 y.o. male who was admitted 07/08/2023 for operative treatment of S/P lumbar fusion. Patient failed conservative treatments (please see the history and physical for the specifics) and had severe unremitting pain that affects sleep, daily activities and work/hobbies. After pre-op clearance, the patient was taken to the operating room on 07/08/2023 and underwent  Procedure(s): Anterior lumbar interbody fusion L5-S1 with posterior supplemental fusion POSTERIOR LUMBAR FUSION 1 LEVEL ABDOMINAL EXPOSURE.    Patient was given perioperative antibiotics:  Anti-infectives (From admission, onward)    Start     Dose/Rate Route Frequency Ordered Stop   07/08/23 1800  ceFAZolin (ANCEF) IVPB 1 g/50 mL premix        1 g 100 mL/hr over 30 Minutes Intravenous Every 8 hours 07/08/23 1504 07/09/23 0112   07/08/23 0629  ceFAZolin (ANCEF) 3 g in sodium chloride 0.9 % 100 mL IVPB        3 g 200 mL/hr over 30 Minutes  Intravenous 30 min pre-op 07/08/23 4098 07/08/23 1229        Patient was given sequential compression devices and early ambulation to prevent DVT.   Patient benefited maximally from hospital stay and there were no complications. At the time of discharge, the patient was urinating/moving their bowels without difficulty, tolerating a regular diet, pain is controlled with oral pain medications and they have been cleared by PT/OT.   Recent vital signs: Patient Vitals for the past 24 hrs:  BP Temp Temp src Pulse Resp SpO2  07/12/23 0551 (!) 149/85 98.8 F (37.1 C) Oral 99 20 100 %  07/12/23 0022 (!) 109/50 98.3 F (36.8 C) Oral (!) 103 14 100 %  07/11/23 2113 (!) 143/74 98 F (36.7 C) Oral (!) 104 20 100 %  07/11/23 1554 (!) 158/89 98 F (36.7 C) Oral (!) 102 18 100 %  07/11/23 1052 135/80 98.2 F (36.8 C) Oral (!) 102 18 97 %  07/11/23 0736 117/76 98.6 F (37 C) Oral 97 18 100 %     Recent laboratory studies: No results for input(s): "WBC", "HGB", "HCT", "PLT", "NA", "K", "CL", "CO2", "BUN", "CREATININE", "GLUCOSE", "INR", "CALCIUM" in the last 72 hours.  Invalid input(s): "PT", "2"   Discharge Medications:   Allergies as of 07/12/2023       Reactions   Other Hives   ONLY ORAL steroids. Hives on upper torso only.   Nsaids Hives   Peanut Oil Hives   Peanut-containing Drug Products Other (See Comments)   Prednisone Hives  Medication List     STOP taking these medications    cyclobenzaprine 10 MG tablet Commonly known as: FLEXERIL   diazepam 5 MG tablet Commonly known as: Valium   ELDERBERRY PO   meloxicam 15 MG tablet Commonly known as: MOBIC   Omega 3 1000 MG Caps   ondansetron 4 MG disintegrating tablet Commonly known as: ZOFRAN-ODT   oxyCODONE 5 MG immediate release tablet Commonly known as: Roxicodone       TAKE these medications    amLODipine 5 MG tablet Commonly known as: NORVASC Take 2.5 mg by mouth daily.   colchicine 0.6 MG  tablet Take 0.6 mg by mouth daily as needed (gout flare).   enoxaparin 40 MG/0.4ML injection Commonly known as: LOVENOX Inject 0.4 mLs (40 mg total) into the skin daily for 10 days. 10 day supply 1 injection per day   hydrochlorothiazide 25 MG tablet Commonly known as: HYDRODIURIL Take 25 mg by mouth daily.   losartan 100 MG tablet Commonly known as: COZAAR Take 100 mg by mouth daily.   metFORMIN 500 MG tablet Commonly known as: GLUCOPHAGE Take 500 mg by mouth in the morning.   methimazole 10 MG tablet Commonly known as: TAPAZOLE Take 1 tablet (10 mg total) by mouth 2 (two) times daily.   methocarbamol 500 MG tablet Commonly known as: ROBAXIN Take 1 tablet (500 mg total) by mouth every 8 (eight) hours as needed for up to 5 days for muscle spasms. What changed: See the new instructions.   ondansetron 4 MG tablet Commonly known as: Zofran Take 1 tablet (4 mg total) by mouth every 8 (eight) hours as needed for nausea or vomiting.   oxyCODONE-acetaminophen 10-325 MG tablet Commonly known as: Percocet Take 1 tablet by mouth every 6 (six) hours as needed for up to 5 days for pain.   rosuvastatin 10 MG tablet Commonly known as: CRESTOR Take 10 mg by mouth at bedtime.   Valtrex 500 MG tablet Generic drug: valACYclovir Take 500 mg by mouth daily as needed (outbreaks).               Durable Medical Equipment  (From admission, onward)           Start     Ordered   07/09/23 1506  For home use only DME Walker rolling  Once       Question Answer Comment  Walker: With 5 Inch Wheels   Patient needs a walker to treat with the following condition S/P lumbar spinal fusion      07/09/23 1506            Diagnostic Studies: DG Abd 1 View Result Date: 07/10/2023 CLINICAL DATA:  Constipation. EXAM: ABDOMEN - 1 VIEW COMPARISON:  07/08/2023 FINDINGS: Supine abdomen shows no gaseous small bowel dilatation prominent stool volume noted right and transverse colon with  mild gaseous distention of left colon. Status post lumbosacral fusion. IMPRESSION: Prominent stool volume in the right and transverse colon. Electronically Signed   By: Kennith Center M.D.   On: 07/10/2023 12:09   VAS [place] LOWER EXTREMITY VENOUS (DVT) Result Date: 07/09/2023  Lower Venous DVT Study Patient Name:  OLMAN CHATTIN  Date of Exam:   07/09/2023 Medical Rec #: [id number]         Accession #:    [id number] Date of Birth: Nov 17, 1960         Patient Gender: M Patient Age:   38 years Exam Location:  [facility] Procedure:  VAS [place] LOWER EXTREMITY VENOUS (DVT) Referring Phys: Debria Garret Shelbie Franken --------------------------------------------------------------------------------  Indications: Post-op, and Post ALIF.  Comparison Study: No prior exam. Performing Technologist: Fernande Bras  Examination Guidelines: A complete evaluation includes B-mode imaging, spectral Doppler, color Doppler, and power Doppler as needed of all accessible portions of each vessel. Bilateral testing is considered an integral part of a complete examination. Limited examinations for reoccurring indications may be performed as noted. The reflux portion of the exam is performed with the patient in reverse Trendelenburg.  +---------+---------------+---------+-----------+----------+--------------+ RIGHT    CompressibilityPhasicitySpontaneityPropertiesThrombus Aging +---------+---------------+---------+-----------+----------+--------------+ CFV      Full           Yes      Yes                                 +---------+---------------+---------+-----------+----------+--------------+ SFJ      Full           Yes      Yes                                 +---------+---------------+---------+-----------+----------+--------------+ FV Prox  Full                                                        +---------+---------------+---------+-----------+----------+--------------+ FV Mid   Full                                                         +---------+---------------+---------+-----------+----------+--------------+ FV DistalFull                                                        +---------+---------------+---------+-----------+----------+--------------+ PFV      Full                                                        +---------+---------------+---------+-----------+----------+--------------+ POP      Full           Yes      Yes                                 +---------+---------------+---------+-----------+----------+--------------+ PTV      Full                                                        +---------+---------------+---------+-----------+----------+--------------+ PERO     Full                                                        +---------+---------------+---------+-----------+----------+--------------+   +---------+---------------+---------+-----------+----------+--------------+  LEFT     CompressibilityPhasicitySpontaneityPropertiesThrombus Aging +---------+---------------+---------+-----------+----------+--------------+ CFV      Full           Yes      Yes                                 +---------+---------------+---------+-----------+----------+--------------+ SFJ      Full           Yes      Yes                                 +---------+---------------+---------+-----------+----------+--------------+ FV Prox  Full                                                        +---------+---------------+---------+-----------+----------+--------------+ FV Mid   Full                                                        +---------+---------------+---------+-----------+----------+--------------+ FV DistalFull                                                        +---------+---------------+---------+-----------+----------+--------------+ PFV      Full                                                         +---------+---------------+---------+-----------+----------+--------------+ POP      Full           Yes      Yes                                 +---------+---------------+---------+-----------+----------+--------------+ PTV      Full                                                        +---------+---------------+---------+-----------+----------+--------------+ PERO     Full                                                        +---------+---------------+---------+-----------+----------+--------------+     Summary: BILATERAL: - No evidence of deep vein thrombosis seen in the lower extremities, bilaterally. -No evidence of popliteal cyst, bilaterally.   *See table(s) above for measurements and observations. Electronically signed by Lemar Livings MD on 07/09/2023 at 1:37:28 PM.    Final    DG  Lumbar Spine 2-3 Views Result Date: 07/08/2023 CLINICAL DATA:  L5-S1 ALIF EXAM: LUMBAR SPINE - 2-3 VIEW COMPARISON:  02/28/2022 FINDINGS: Fluoroscopic spot images demonstrate ALIF along with posterolateral rod and pedicle screw fixators bilaterally at the L5-S1 level. No malalignment or complicating feature observed. No unexpected foreign body. IMPRESSION: 1. L5-S1 ALIF and posterolateral rod and pedicle screw fixators. Electronically Signed   By: Gaylyn Rong M.D.   On: 07/08/2023 17:14   DG C-Arm 1-60 Min-No Report Result Date: 07/08/2023 Fluoroscopy was utilized by the requesting physician.  No radiographic interpretation.   DG C-Arm 1-60 Min-No Report Result Date: 07/08/2023 Fluoroscopy was utilized by the requesting physician.  No radiographic interpretation.   DG C-Arm 1-60 Min-No Report Result Date: 07/08/2023 Fluoroscopy was utilized by the requesting physician.  No radiographic interpretation.   DG C-Arm 1-60 Min-No Report Result Date: 07/08/2023 Fluoroscopy was utilized by the requesting physician.  No radiographic interpretation.   DG C-Arm 1-60 Min-No Report Result  Date: 07/08/2023 Fluoroscopy was utilized by the requesting physician.  No radiographic interpretation.   DG OR LOCAL ABDOMEN Result Date: 07/08/2023 CLINICAL DATA:  62 year old male undergoing lumbar surgery. EXAM: OR LOCAL ABDOMEN COMPARISON:  Lumbar MRI 12/20/2022. FINDINGS: Portable AP supine views at 1101, 1102 hours imaging the lower lumbar spine and pelvis. Anterior type interbody fusion hardware is visible at the lumbosacral junction. Small surgical clip projects over the central sacrum. No other No radiopaque foreign body identified. Pelvic phleboliths. Nonobstructed bowel-gas pattern. IMPRESSION: No retained instrument or unexpected radiopaque foreign body identified. Lumbosacral anterior fusion hardware visible. Study discussed by telephone with Arline Asp in the OR on 07/08/2023 at 11:15 . Electronically Signed   By: Odessa Fleming M.D.   On: 07/08/2023 11:16    Discharge Instructions     Incentive spirometry RT   Complete by: As directed         Follow-up Information     Venita Lick, MD. Schedule an appointment as soon as possible for a visit in 2 week(s).   Specialty: Orthopedic Surgery Why: If symptoms worsen, For suture removal, For wound re-check Contact information: [address] [place] [address] [phone]                 Discharge Plan:  discharge to home  Disposition:Wise very pleasant 62 year old gentleman who underwent an anterior posterior L5-S1 fusion.  Patient states that his preoperative neuropathic leg pain has improved and his major complaint is consistent with incisional back pain.  He has been ambulating without assistance and has been able to transition from bed to chair to standing without difficulty.  He is tolerating a regular diet and has had a large positive bowel movement.  He is also having positive flatus.  Patient will be discharged to home today.  His hospital course was only complicated by constipation which has  resolved.  His pain is well-controlled with oral medications.  He will be discharged with a 10-day supply of Lovenox for DVT prevention, Percocet, and methocarbamol.  Patient was also given Zofran for nausea.  Patient will follow-up with me in 2 weeks for reevaluation.    Signed: Alvy Beal for Dr. Venita Lick [facility] [phone] 07/12/2023, 6:54 AM

## 2023-07-19 ENCOUNTER — Other Ambulatory Visit (HOSPITAL_COMMUNITY): Payer: Self-pay | Admitting: Orthopedic Surgery

## 2023-07-19 ENCOUNTER — Ambulatory Visit (HOSPITAL_COMMUNITY)
Admission: RE | Admit: 2023-07-19 | Discharge: 2023-07-19 | Disposition: A | Discharge status: Home / Self Care | Payer: 59 | Source: Ambulatory Visit | Attending: Surgery | Admitting: Surgery

## 2023-07-19 DIAGNOSIS — M79604 Pain in right leg: Secondary | ICD-10-CM | POA: Diagnosis not present

## 2023-08-17 ENCOUNTER — Other Ambulatory Visit: Payer: Self-pay | Admitting: Surgical

## 2023-08-19 DIAGNOSIS — M79606 Pain in leg, unspecified: Secondary | ICD-10-CM | POA: Diagnosis not present

## 2023-08-19 DIAGNOSIS — M545 Low back pain, unspecified: Secondary | ICD-10-CM | POA: Diagnosis not present

## 2023-09-04 DIAGNOSIS — H26491 Other secondary cataract, right eye: Secondary | ICD-10-CM | POA: Diagnosis not present

## 2023-09-04 DIAGNOSIS — Z9841 Cataract extraction status, right eye: Secondary | ICD-10-CM | POA: Diagnosis not present

## 2023-09-04 DIAGNOSIS — E119 Type 2 diabetes mellitus without complications: Secondary | ICD-10-CM | POA: Diagnosis not present

## 2023-09-04 DIAGNOSIS — Z961 Presence of intraocular lens: Secondary | ICD-10-CM | POA: Diagnosis not present

## 2023-09-16 ENCOUNTER — Ambulatory Visit: Payer: 59 | Admitting: Internal Medicine

## 2023-09-16 NOTE — Progress Notes (Deleted)
 Name: Jeffery [name]  MRN/ DOB: [id number], 02-Jul-1961    Age/ Sex: 63 y.o., male    PCP: Emilio Aspen, MD   Reason for Endocrinology Evaluation: Hyperthyroidism      Date of Initial Endocrinology Evaluation: 11/08/2021    HPI: [name] is a 63 y.o. male with a past medical history of T2DM, dyslipidemia and DJD. The patient presented for initial endocrinology clinic visit on 11/08/2021 for consultative assistance with his Hyperthyroidism .    He was diagnosed with hyperthyroidism in September 2019, he was noted to have abnormal thyroid hormone levels during routine testing, he was subsequently seen by endocrinologist at [facility] physicians and was started on methimazole at the time.  He eventually transitioned care to [facility] endocrinology with his last visit in 2021.   The patient has been off methimazole since 2021 until his return to his PCP in January 2023 when he was noted with palpitations and a slight tremor of her finger and that is when he was diagnosed with hyperthyroidism again and was restarted on methimazole   TRAb elevated at 10.94 IU/L 10/2021  Patient was lost to follow-up for approximately 13 months prior to his return to our office  Mother with Luiz Blare' disease   SUBJECTIVE:    Today (09/16/23): [name] is here for follow-up on hyperthyroidism.   Patient is s/p lumbar fusion 06/2023  Weight continues to fluctuate Denies local neck swelling  Denies constipation or diarrhea  Denies palpitations  Denies tremors   Methimazole 10 mg , 2 tabs daily        HISTORY:  Past Medical History:  Past Medical History:  Diagnosis Date   Anal fissure    Diabetes mellitus without complication (HCC)    Type 2   Fatty liver    GERD (gastroesophageal reflux disease)    Hyperplastic rectal polyp    Hypertension    Hyperthyroidism    Lipoma    Lumbar degenerative disc disease    Past Surgical History:  Past Surgical  History:  Procedure Laterality Date   ABDOMINAL EXPOSURE N/A 07/08/2023   Procedure: ABDOMINAL EXPOSURE;  Surgeon: Cephus Shelling, MD;  Location: [facility] OR;  Service: Vascular;  Laterality: N/A;   ANTERIOR LUMBAR FUSION N/A 07/08/2023   Procedure: Anterior lumbar interbody fusion L5-S1 with posterior supplemental fusion;  Surgeon: Venita Lick, MD;  Location: [facility] OR;  Service: Orthopedics;  Laterality: N/A;    BUNIONECTOMY Left    CATARACT EXTRACTION W/ INTRAOCULAR LENS IMPLANT Bilateral    Right - 05/2023, Left - 2021   CERVICAL DISC ARTHROPLASTY  07/23/2005   C-5 and C-6   COLONOSCOPY     SHOULDER SURGERY  2008 and 2012   left in 2008, right had rtc tear 2012    Social History:  reports that he has never smoked. He has never used smokeless tobacco. He reports that he does not drink alcohol and does not use drugs. Family History: family history includes Diabetes in his father and mother.   HOME MEDICATIONS: Allergies as of 09/16/2023       Reactions   Other Hives   ONLY ORAL steroids. Hives on upper torso only.   Nsaids Hives   Peanut Oil Hives   Peanut-containing Drug Products Other (See Comments)   Prednisone Hives        Medication List        Accurate as of September 16, 2023  6:49 AM. If you have any  questions, ask your nurse or doctor.          amLODipine 5 MG tablet Commonly known as: NORVASC Take 2.5 mg by mouth daily.   colchicine 0.6 MG tablet Take 0.6 mg by mouth daily as needed (gout flare).   enoxaparin 40 MG/0.4ML injection Commonly known as: LOVENOX Inject 0.4 mLs (40 mg total) into the skin daily for 10 days. 10 day supply 1 injection per day   hydrochlorothiazide 25 MG tablet Commonly known as: HYDRODIURIL Take 25 mg by mouth daily.   losartan 100 MG tablet Commonly known as: COZAAR Take 100 mg by mouth daily.   meloxicam 15 MG tablet Commonly known as: MOBIC TAKE 1 TABLET BY MOUTH EVERY DAY AS NEEDED FOR PAIN   metFORMIN  500 MG tablet Commonly known as: GLUCOPHAGE Take 500 mg by mouth in the morning.   methimazole 10 MG tablet Commonly known as: TAPAZOLE Take 1 tablet (10 mg total) by mouth 2 (two) times daily.   ondansetron 4 MG tablet Commonly known as: Zofran Take 1 tablet (4 mg total) by mouth every 8 (eight) hours as needed for nausea or vomiting.   rosuvastatin 10 MG tablet Commonly known as: CRESTOR Take 10 mg by mouth at bedtime.   Valtrex 500 MG tablet Generic drug: valACYclovir Take 500 mg by mouth daily as needed (outbreaks).          REVIEW OF SYSTEMS: A comprehensive ROS was conducted with the patient and is negative except as per HPI    OBJECTIVE:  VS: There were no vitals taken for this visit.   Wt Readings from Last 3 Encounters:  07/08/23 265 lb (120.2 kg)  07/02/23 268 lb 12.8 oz (121.9 kg)  06/13/23 268 lb (121.6 kg)     EXAM: General: Pt appears well and is in NAD  Neck: General: Supple without adenopathy. Thyroid: Thyroid size normal.  No goiter or nodules appreciated.  Lungs: Clear with good BS bilat   Heart: Auscultation: RRR.  Extremities:  BL LE: Trace  pretibial edema  Mental Status: Judgment, insight: Intact Orientation: Oriented to time, place, and person Mood and affect: No depression, anxiety, or agitation     DATA REVIEWED:  Latest Reference Range & Units 06/10/23 08:51  TSH 0.35 - 5.50 uIU/mL 0.12 (L)  Triiodothyronine,Free,Serum 2.3 - 4.2 pg/mL 3.6  T4,Free(Direct) 0.60 - 1.60 ng/dL 1.30  (L): Data is abnormally low     Latest Reference Range & Units 11/08/21 12:03  Sodium 135 - 145 mEq/L 143  Potassium 3.5 - 5.1 mEq/L 4.1  Chloride 96 - 112 mEq/L 104  CO2 19 - 32 mEq/L 29  Glucose 70 - 99 mg/dL 865 (H)  BUN 6 - 23 mg/dL 19  Creatinine 7.84 - 6.96 mg/dL 2.95  Calcium 8.4 - 28.4 mg/dL 9.6  Alkaline Phosphatase 39 - 117 U/L 167 (H)  Albumin 3.5 - 5.2 g/dL 4.2  AST 0 - 37 U/L 18  ALT 0 - 53 U/L 29  Total Protein 6.0 - 8.3 g/dL  7.4  Total Bilirubin 0.2 - 1.2 mg/dL 0.4  GFR >13.24 mL/min 71.30  WBC 4.0 - 10.5 K/uL 7.8  RBC 4.22 - 5.81 Mil/uL 5.67  Hemoglobin 13.0 - 17.0 g/dL 40.1  HCT 02.7 - 25.3 % 43.9  MCV 78.0 - 100.0 fl 77.4 (L)  MCHC 30.0 - 36.0 g/dL 66.4  RDW 40.3 - 47.4 % 14.9  Platelets 150.0 - 400.0 K/uL 290.0  Neutrophils 43.0 - 77.0 % 54.3  Lymphocytes  12.0 - 46.0 % 36.5  Monocytes Relative 3.0 - 12.0 % 8.4  Eosinophil 0.0 - 5.0 % 0.5  Basophil 0.0 - 3.0 % 0.3  NEUT# 1.4 - 7.7 K/uL 4.2  Lymphocyte # 0.7 - 4.0 K/uL 2.8  Monocyte # 0.1 - 1.0 K/uL 0.7  Eosinophils Absolute 0.0 - 0.7 K/uL 0.0  Basophils Absolute 0.0 - 0.1 K/uL 0.0  TSH 0.35 - 5.50 uIU/mL <0.01  Triiodothyronine (T3) 76 - 181 ng/dL 161 (H)  W9,UEAV(WUJWJX) 0.60 - 1.60 ng/dL 9.14     Latest Reference Range & Units Most Recent  TRAB <=2.00 IU/L 10.94 (H) 11/08/21 12:03      ASSESSMENT/PLAN/RECOMMENDATIONS:   Hyperthyroidism:  -Patient is clinically euthyroid -He assures me compliance with methimazole intake -We discussed cardiovascular and increased bone resorption risk with uncontrolled hyperthyroid -We also briefly discussed alternative therapy to include total thyroidectomy versus RAI ablation, I explained to the patient that he will need lifelong LT-4 replacement -TSH continues to be low, will increase methimazole and repeat TFTs in 2 months  Medications : Increase methimazole 10 mg, BID   2. Graves' Disease:  -No extrathyroidal manifestations of Graves' disease    Follow-up in 4 months Labs in 2 months   Signed electronically by: Lyndle Herrlich, MD  [facility]  [facility] [address] [place] [place], [place] [address] Phone: [phone] FAX: [phone]   CC: Emilio Aspen, MD [address] [place] [address] Phone: [phone] Fax: [phone]   Return to Endocrinology clinic as below: Future Appointments  Date Time  Provider Department Center  09/16/2023  8:30 AM Elanie Hammitt, Konrad Dolores, MD [id number] None

## 2023-09-17 ENCOUNTER — Ambulatory Visit: Payer: 59 | Admitting: Internal Medicine

## 2023-09-17 ENCOUNTER — Encounter: Payer: Self-pay | Admitting: Internal Medicine

## 2023-09-17 VITALS — BP 126/72 | HR 99 | Ht 74.0 in | Wt 261.0 lb

## 2023-09-17 DIAGNOSIS — E0590 Thyrotoxicosis, unspecified without thyrotoxic crisis or storm: Secondary | ICD-10-CM | POA: Diagnosis not present

## 2023-09-17 DIAGNOSIS — E0500 Thyrotoxicosis with diffuse goiter without thyrotoxic crisis or storm: Secondary | ICD-10-CM

## 2023-09-17 NOTE — Progress Notes (Unsigned)
 Name: Jeffery [name]  MRN/ DOB: [id number], 1961-07-15    Age/ Sex: 63 y.o., male    PCP: Jeffery Aspen, MD   Reason for Endocrinology Evaluation: Hyperthyroidism      Date of Initial Endocrinology Evaluation: 11/08/2021    HPI: Jeffery [name] is a 63 y.o. male with a past medical history of T2DM, dyslipidemia and DJD. The patient presented for initial endocrinology clinic visit on 11/08/2021 for consultative assistance with his Hyperthyroidism .    He was diagnosed with hyperthyroidism in September 2019, he was noted to have abnormal thyroid hormone levels during routine testing, he was subsequently seen by endocrinologist at [facility] physicians and was started on methimazole at the time.  He eventually transitioned care to [facility] endocrinology with his last visit in 2021.   The patient has been off methimazole since 2021 until his return to his PCP in January 2023 when he was noted with palpitations and a slight tremor of her finger and that is when he was diagnosed with hyperthyroidism again and was restarted on methimazole   TRAb elevated at 10.94 IU/L 10/2021  Patient was lost to follow-up for approximately 13 months prior to his return to our office  Mother with Jeffery [name]' disease   SUBJECTIVE:    Today (09/17/23): Jeffery [name] is here for follow-up on hyperthyroidism.   Patient is s/p lumbar fusion 06/2023 Weight trending down   Denies local neck swelling  Denies constipation or diarrhea  Denies palpitations  Denies tremors   Methimazole 10 mg , 2 tabs daily        HISTORY:  Past Medical History:  Past Medical History:  Diagnosis Date   Anal fissure    Diabetes mellitus without complication (HCC)    Type 2   Fatty liver    GERD (gastroesophageal reflux disease)    Hyperplastic rectal polyp    Hypertension    Hyperthyroidism    Lipoma    Lumbar degenerative disc disease    Past Surgical History:  Past Surgical History:   Procedure Laterality Date   ABDOMINAL EXPOSURE N/A 07/08/2023   Procedure: ABDOMINAL EXPOSURE;  Surgeon: Cephus Shelling, MD;  Location: [facility] OR;  Service: Vascular;  Laterality: N/A;   ANTERIOR LUMBAR FUSION N/A 07/08/2023   Procedure: Anterior lumbar interbody fusion L5-S1 with posterior supplemental fusion;  Surgeon: Venita Lick, MD;  Location: [facility] OR;  Service: Orthopedics;  Laterality: N/A;    BUNIONECTOMY Left    CATARACT EXTRACTION W/ INTRAOCULAR LENS IMPLANT Bilateral    Right - 05/2023, Left - 2021   CERVICAL DISC ARTHROPLASTY  07/23/2005   C-5 and C-6   COLONOSCOPY     SHOULDER SURGERY  2008 and 2012   left in 2008, right had rtc tear 2012    Social History:  reports that he has never smoked. He has never used smokeless tobacco. He reports that he does not drink alcohol and does not use drugs. Family History: family history includes Diabetes in his father and mother.   HOME MEDICATIONS: Allergies as of 09/17/2023       Reactions   Other Hives   ONLY ORAL steroids. Hives on upper torso only.   Nsaids Hives   Peanut Oil Hives   Peanut-containing Drug Products Other (See Comments)   Prednisone Hives        Medication List        Accurate as of September 17, 2023 12:53 PM. If you have any questions,  ask your nurse or doctor.          amLODipine 5 MG tablet Commonly known as: NORVASC Take 2.5 mg by mouth daily.   colchicine 0.6 MG tablet Take 0.6 mg by mouth daily as needed (gout flare).   enoxaparin 40 MG/0.4ML injection Commonly known as: LOVENOX Inject 0.4 mLs (40 mg total) into the skin daily for 10 days. 10 day supply 1 injection per day   gabapentin 100 MG capsule Commonly known as: NEURONTIN TAKE 3 CAPSULES BY MOUTH AS DIRECTED   hydrochlorothiazide 25 MG tablet Commonly known as: HYDRODIURIL Take 25 mg by mouth daily.   losartan 100 MG tablet Commonly known as: COZAAR Take 100 mg by mouth daily.   meloxicam 15 MG  tablet Commonly known as: MOBIC TAKE 1 TABLET BY MOUTH EVERY DAY AS NEEDED FOR PAIN   metFORMIN 500 MG tablet Commonly known as: GLUCOPHAGE Take 500 mg by mouth in the morning.   methimazole 10 MG tablet Commonly known as: TAPAZOLE Take 1 tablet (10 mg total) by mouth 2 (two) times daily.   methocarbamol 500 MG tablet Commonly known as: ROBAXIN TAKE 1 TABLET BY MOUTH EVERY 8 HOURS AS NEEDED FOR MUSCLE SPASM FOR UPTO 5 DAYS   ondansetron 4 MG tablet Commonly known as: Zofran Take 1 tablet (4 mg total) by mouth every 8 (eight) hours as needed for nausea or vomiting.   rosuvastatin 10 MG tablet Commonly known as: CRESTOR Take 10 mg by mouth at bedtime.   Valtrex 500 MG tablet Generic drug: valACYclovir Take 500 mg by mouth daily as needed (outbreaks).          REVIEW OF SYSTEMS: A comprehensive ROS was conducted with the patient and is negative except as per HPI    OBJECTIVE:  VS: BP 126/72 (BP Location: Right Arm, Patient Position: Sitting, Cuff Size: Normal)   Pulse 99   Ht 6\' 2"  (1.88 m)   Wt 261 lb (118.4 kg)   SpO2 98%   BMI 33.51 kg/m    Wt Readings from Last 3 Encounters:  09/17/23 261 lb (118.4 kg)  07/08/23 265 lb (120.2 kg)  07/02/23 268 lb 12.8 oz (121.9 kg)     EXAM: General: Pt appears well and is in NAD  Neck: General: Supple without adenopathy. Thyroid: Thyroid size normal.  No goiter or nodules appreciated.  Lungs: Clear with good BS bilat   Heart: Auscultation: RRR.  Extremities:  BL LE: Trace  pretibial edema  Mental Status: Judgment, insight: Intact Orientation: Oriented to time, place, and person Mood and affect: No depression, anxiety, or agitation     DATA REVIEWED:   Latest Reference Range & Units 09/17/23 13:16  TSH 0.40 - 4.50 mIU/L 1.31  T4,Free(Direct) 0.8 - 1.8 ng/dL 1.1        Latest Reference Range & Units 11/08/21 12:03  Sodium 135 - 145 mEq/L 143  Potassium 3.5 - 5.1 mEq/L 4.1  Chloride 96 - 112 mEq/L 104   CO2 19 - 32 mEq/L 29  Glucose 70 - 99 mg/dL 161 (H)  BUN 6 - 23 mg/dL 19  Creatinine 0.96 - 0.45 mg/dL 4.09  Calcium 8.4 - 81.1 mg/dL 9.6  Alkaline Phosphatase 39 - 117 U/L 167 (H)  Albumin 3.5 - 5.2 g/dL 4.2  AST 0 - 37 U/L 18  ALT 0 - 53 U/L 29  Total Protein 6.0 - 8.3 g/dL 7.4  Total Bilirubin 0.2 - 1.2 mg/dL 0.4  GFR >91.47 mL/min 71.30  WBC 4.0 - 10.5 K/uL 7.8  RBC 4.22 - 5.81 Mil/uL 5.67  Hemoglobin 13.0 - 17.0 g/dL 11.9  HCT 14.7 - 82.9 % 43.9  MCV 78.0 - 100.0 fl 77.4 (L)  MCHC 30.0 - 36.0 g/dL 56.2  RDW 13.0 - 86.5 % 14.9  Platelets 150.0 - 400.0 K/uL 290.0  Neutrophils 43.0 - 77.0 % 54.3  Lymphocytes 12.0 - 46.0 % 36.5  Monocytes Relative 3.0 - 12.0 % 8.4  Eosinophil 0.0 - 5.0 % 0.5  Basophil 0.0 - 3.0 % 0.3  NEUT# 1.4 - 7.7 K/uL 4.2  Lymphocyte # 0.7 - 4.0 K/uL 2.8  Monocyte # 0.1 - 1.0 K/uL 0.7  Eosinophils Absolute 0.0 - 0.7 K/uL 0.0  Basophils Absolute 0.0 - 0.1 K/uL 0.0  TSH 0.35 - 5.50 uIU/mL <0.01  Triiodothyronine (T3) 76 - 181 ng/dL 784 (H)  O9,GEXB(MWUXLK) 0.60 - 1.60 ng/dL 4.40     Latest Reference Range & Units Most Recent  TRAB <=2.00 IU/L 10.94 (H) 11/08/21 12:03      ASSESSMENT/PLAN/RECOMMENDATIONS:   Hyperthyroidism:  -Patient is clinically euthyroid -We discussed cardiovascular and increased bone resorption risk with uncontrolled hyperthyroid - Pt not interested in alternative therapy at this time -TFTs are normal, no change   Medications : Continue methimazole 10 mg, BID   2. Graves' Disease:  -No extrathyroidal manifestations of Graves' disease    Follow-up in 4 months   Signed electronically by: Lyndle Herrlich, MD  [facility] Endocrinology  [facility] [address] [place] [place], [place] [address] Phone: [phone] FAX: [phone]   CC: Jeffery Aspen, MD [address] [place] [address] Phone: [phone] Fax: [phone]   Return to Endocrinology  clinic as below: Future Appointments  Date Time Provider Department Center  09/17/2023  1:00 PM Daryl Quiros, Konrad Dolores, MD [facility] None

## 2023-09-18 ENCOUNTER — Encounter: Payer: Self-pay | Admitting: Internal Medicine

## 2023-09-18 LAB — TSH: TSH: 1.31 m[IU]/L (ref 0.40–4.50)

## 2023-09-18 LAB — T4, FREE: Free T4: 1.1 ng/dL (ref 0.8–1.8)

## 2023-09-18 MED ORDER — METHIMAZOLE 10 MG PO TABS: 10.0000 mg | ORAL_TABLET | Freq: Two times a day (BID) | ORAL | 2 refills | Status: DC

## 2023-10-08 DIAGNOSIS — Z4889 Encounter for other specified surgical aftercare: Secondary | ICD-10-CM | POA: Diagnosis not present

## 2023-10-09 DIAGNOSIS — I10 Essential (primary) hypertension: Secondary | ICD-10-CM | POA: Diagnosis not present

## 2023-10-09 DIAGNOSIS — E785 Hyperlipidemia, unspecified: Secondary | ICD-10-CM | POA: Diagnosis not present

## 2023-10-09 DIAGNOSIS — E0590 Thyrotoxicosis, unspecified without thyrotoxic crisis or storm: Secondary | ICD-10-CM | POA: Diagnosis not present

## 2023-10-09 DIAGNOSIS — Z125 Encounter for screening for malignant neoplasm of prostate: Secondary | ICD-10-CM | POA: Diagnosis not present

## 2023-10-09 DIAGNOSIS — M5441 Lumbago with sciatica, right side: Secondary | ICD-10-CM | POA: Diagnosis not present

## 2023-10-09 DIAGNOSIS — E669 Obesity, unspecified: Secondary | ICD-10-CM | POA: Diagnosis not present

## 2023-10-09 DIAGNOSIS — E119 Type 2 diabetes mellitus without complications: Secondary | ICD-10-CM | POA: Diagnosis not present

## 2023-10-09 DIAGNOSIS — E559 Vitamin D deficiency, unspecified: Secondary | ICD-10-CM | POA: Diagnosis not present

## 2023-10-09 DIAGNOSIS — Z5181 Encounter for therapeutic drug level monitoring: Secondary | ICD-10-CM | POA: Diagnosis not present

## 2023-10-09 DIAGNOSIS — E611 Iron deficiency: Secondary | ICD-10-CM | POA: Diagnosis not present

## 2023-10-09 DIAGNOSIS — Z1211 Encounter for screening for malignant neoplasm of colon: Secondary | ICD-10-CM | POA: Diagnosis not present

## 2023-10-14 DIAGNOSIS — M545 Low back pain, unspecified: Secondary | ICD-10-CM | POA: Diagnosis not present

## 2023-10-14 DIAGNOSIS — M79606 Pain in leg, unspecified: Secondary | ICD-10-CM | POA: Diagnosis not present

## 2024-01-08 DIAGNOSIS — G8918 Other acute postprocedural pain: Secondary | ICD-10-CM | POA: Diagnosis not present

## 2024-01-27 ENCOUNTER — Encounter: Payer: Self-pay | Admitting: Internal Medicine

## 2024-01-27 ENCOUNTER — Ambulatory Visit: Payer: 59 | Admitting: Internal Medicine

## 2024-01-27 VITALS — BP 138/84 | HR 78 | Ht 74.0 in | Wt 268.0 lb

## 2024-01-27 DIAGNOSIS — E0500 Thyrotoxicosis with diffuse goiter without thyrotoxic crisis or storm: Secondary | ICD-10-CM

## 2024-01-27 DIAGNOSIS — E0590 Thyrotoxicosis, unspecified without thyrotoxic crisis or storm: Secondary | ICD-10-CM

## 2024-01-27 NOTE — Progress Notes (Unsigned)
 Name: Jeffery [name]  MRN/ DOB: [id number], 11-08-1960    Age/ Sex: 63 y.o., male    PCP: Charlott Dorn LABOR, MD   Reason for Endocrinology Evaluation: Hyperthyroidism      Date of Initial Endocrinology Evaluation: 11/08/2021    HPI: [name] is a 63 y.o. male with a past medical history of T2DM, dyslipidemia and DJD. The patient presented for initial endocrinology clinic visit on 11/08/2021 for consultative assistance with his Hyperthyroidism .    He was diagnosed with hyperthyroidism in September 2019, he was noted to have abnormal thyroid  hormone levels during routine testing, he was subsequently seen by endocrinologist at [facility] physicians and was started on methimazole  at the time.  He eventually transitioned care to [facility] endocrinology with his last visit in 2021.   The patient has been off methimazole  since 2021 until his return to his PCP in January 2023 when he was noted with palpitations and a slight tremor of her finger and that is when he was diagnosed with hyperthyroidism again and was restarted on methimazole    TRAb elevated at 10.94 IU/L 10/2021  Patient was lost to follow-up for approximately 13 months prior to his return to our office  Mother with Yvone' [name]   SUBJECTIVE:    Today (01/27/24): Jeffery [name] is here for follow-up on hyperthyroidism.   Patient is s/p lumbar fusion 06/2023, continues to follow-up with [facility]   Weight continues to fluctuate  Denies local neck swelling  Denies constipation or diarrhea  Denies palpitations  Denies tremors   Methimazole  10 mg , 2 tabs daily        HISTORY:  Past Medical History:  Past Medical History:  Diagnosis Date   Anal fissure    Diabetes mellitus without complication (HCC)    Type 2   Fatty liver    GERD (gastroesophageal reflux [name])    Hyperplastic rectal polyp    Hypertension    Hyperthyroidism    Lipoma    Lumbar degenerative disc [name]     Past Surgical History:  Past Surgical History:  Procedure Laterality Date   ABDOMINAL EXPOSURE N/A 07/08/2023   Procedure: ABDOMINAL EXPOSURE;  Surgeon: Gretta Lonni PARAS, MD;  Location: [facility] OR;  Service: Vascular;  Laterality: N/A;   ANTERIOR LUMBAR FUSION N/A 07/08/2023   Procedure: Anterior lumbar interbody fusion L5-S1 with posterior supplemental fusion;  Surgeon: Burnetta Aures, MD;  Location: [facility] OR;  Service: Orthopedics;  Laterality: N/A;    BUNIONECTOMY Left    CATARACT EXTRACTION W/ INTRAOCULAR LENS IMPLANT Bilateral    Right - 05/2023, Left - 2021   CERVICAL DISC ARTHROPLASTY  07/23/2005   C-5 and C-6   COLONOSCOPY     SHOULDER SURGERY  2008 and 2012   left in 2008, right had rtc tear 2012    Social History:  reports that he has never smoked. He has never used smokeless tobacco. He reports that he does not drink alcohol and does not use drugs. Family History: family history includes Diabetes in his father and mother.   HOME MEDICATIONS: Allergies as of 01/27/2024       Reactions   Other Hives   ONLY ORAL steroids. Hives on upper torso only.   Nsaids Hives   Peanut Oil Hives   Peanut-containing Drug Products Other (See Comments)   Prednisone  Hives        Medication List        Accurate as of January 27, 2024  9:13 AM. If you have any questions, ask your nurse or doctor.          amLODipine  5 MG tablet Commonly known as: NORVASC  Take 2.5 mg by mouth daily.   colchicine  0.6 MG tablet Take 0.6 mg by mouth daily as needed (gout flare).   enoxaparin  40 MG/0.4ML injection Commonly known as: LOVENOX  Inject 0.4 mLs (40 mg total) into the skin daily for 10 days. 10 day supply 1 injection per day   gabapentin 100 MG capsule Commonly known as: NEURONTIN TAKE 3 CAPSULES BY MOUTH AS DIRECTED   hydrochlorothiazide  25 MG tablet Commonly known as: HYDRODIURIL  Take 25 mg by mouth daily.   irbesartan 300 MG tablet Commonly known as: AVAPRO Take 300 mg  by mouth daily.   losartan  100 MG tablet Commonly known as: COZAAR  Take 100 mg by mouth daily.   meloxicam  15 MG tablet Commonly known as: MOBIC  TAKE 1 TABLET BY MOUTH EVERY DAY AS NEEDED FOR PAIN   metFORMIN  500 MG tablet Commonly known as: GLUCOPHAGE  Take 500 mg by mouth in the morning.   methimazole  10 MG tablet Commonly known as: TAPAZOLE  Take 1 tablet (10 mg total) by mouth 2 (two) times daily.   methocarbamol  500 MG tablet Commonly known as: ROBAXIN  TAKE 1 TABLET BY MOUTH EVERY 8 HOURS AS NEEDED FOR MUSCLE SPASM FOR UPTO 5 DAYS   ondansetron  4 MG tablet Commonly known as: Zofran  Take 1 tablet (4 mg total) by mouth every 8 (eight) hours as needed for nausea or vomiting.   rosuvastatin  10 MG tablet Commonly known as: CRESTOR  Take 10 mg by mouth at bedtime.   Valtrex 500 MG tablet Generic drug: valACYclovir Take 500 mg by mouth daily as needed (outbreaks).          REVIEW OF SYSTEMS: A comprehensive ROS was conducted with the patient and is negative except as per HPI    OBJECTIVE:  VS: BP 138/84 (BP Location: Left Arm, Patient Position: Sitting, Cuff Size: Normal)   Pulse 78   Ht 6' 2 (1.88 m)   Wt 268 lb (121.6 kg)   SpO2 98%   BMI 34.41 kg/m    Wt Readings from Last 3 Encounters:  01/27/24 268 lb (121.6 kg)  09/17/23 261 lb (118.4 kg)  07/08/23 265 lb (120.2 kg)     EXAM: General: Pt appears well and is in NAD  Neck: General: Supple without adenopathy. Thyroid : Thyroid  size normal.  No goiter or nodules appreciated.  Lungs: Clear with good BS bilat   Heart: Auscultation: RRR.  Extremities:  BL LE: Trace  pretibial edema  Mental Status: Judgment, insight: Intact Orientation: Oriented to time, place, and person Mood and affect: No depression, anxiety, or agitation     DATA REVIEWED:   Latest Reference Range & Units 01/27/24 08:39  TSH 0.40 - 4.50 mIU/L 3.63  T4,Free(Direct) 0.8 - 1.8 ng/dL 1.1       Latest Reference Range & Units  11/08/21 12:03  Sodium 135 - 145 mEq/L 143  Potassium 3.5 - 5.1 mEq/L 4.1  Chloride 96 - 112 mEq/L 104  CO2 19 - 32 mEq/L 29  Glucose 70 - 99 mg/dL 850 (H)  BUN 6 - 23 mg/dL 19  Creatinine 9.59 - 8.49 mg/dL 8.88  Calcium  8.4 - 10.5 mg/dL 9.6  Alkaline Phosphatase 39 - 117 U/L 167 (H)  Albumin  3.5 - 5.2 g/dL 4.2  AST 0 - 37 U/L 18  ALT 0 - 53 U/L 29  Total  Protein 6.0 - 8.3 g/dL 7.4  Total Bilirubin 0.2 - 1.2 mg/dL 0.4  GFR >39.99 mL/min 71.30  WBC 4.0 - 10.5 K/uL 7.8  RBC 4.22 - 5.81 Mil/uL 5.67  Hemoglobin 13.0 - 17.0 g/dL 85.6  HCT 60.9 - 47.9 % 43.9  MCV 78.0 - 100.0 fl 77.4 (L)  MCHC 30.0 - 36.0 g/dL 67.2  RDW 88.4 - 84.4 % 14.9  Platelets 150.0 - 400.0 K/uL 290.0  Neutrophils 43.0 - 77.0 % 54.3  Lymphocytes 12.0 - 46.0 % 36.5  Monocytes Relative 3.0 - 12.0 % 8.4  Eosinophil 0.0 - 5.0 % 0.5  Basophil 0.0 - 3.0 % 0.3  NEUT# 1.4 - 7.7 K/uL 4.2  Lymphocyte # 0.7 - 4.0 K/uL 2.8  Monocyte # 0.1 - 1.0 K/uL 0.7  Eosinophils Absolute 0.0 - 0.7 K/uL 0.0  Basophils Absolute 0.0 - 0.1 K/uL 0.0  TSH 0.35 - 5.50 uIU/mL <0.01  Triiodothyronine (T3) 76 - 181 ng/dL 770 (H)  U5,Qmzz(Ipmzru) 0.60 - 1.60 ng/dL 8.47     Latest Reference Range & Units Most Recent  TRAB <=2.00 IU/L 10.94 (H) 11/08/21 12:03      ASSESSMENT/PLAN/RECOMMENDATIONS:   Hyperthyroidism:  -Patient is clinically euthyroid - Pt not interested in alternative therapy at this time - TSH is normal but is at the upper limit of normal, will decrease methimazole    Medications : Decrease methimazole  10 mg, 1.5 tabs daily  2. Graves' [name]:  -No extrathyroidal manifestations of Graves' [name]    Follow-up in 1 yr    Signed electronically by: Stefano Redgie Butts, MD  [facility] Endocrinology  [facility] [address] [place] [place], [place] [address] Phone: [phone] FAX: [phone]   CC: Charlott Dorn LABOR, MD [address] [place]  [address] Phone: [phone] Fax: [phone]   Return to Endocrinology clinic as below: Future Appointments  Date Time Provider Department Center  01/26/2025  8:30 AM Hardie Veltre, Donell Redgie, MD [facility] None

## 2024-01-28 ENCOUNTER — Ambulatory Visit: Payer: Self-pay | Admitting: Internal Medicine

## 2024-01-28 LAB — T4, FREE: Free T4: 1.1 ng/dL (ref 0.8–1.8)

## 2024-01-28 LAB — TSH: TSH: 3.63 m[IU]/L (ref 0.40–4.50)

## 2024-01-28 MED ORDER — METHIMAZOLE 10 MG PO TABS: 15.0000 mg | ORAL_TABLET | Freq: Every day | ORAL | 3 refills | Status: AC

## 2024-03-24 DIAGNOSIS — I10 Essential (primary) hypertension: Secondary | ICD-10-CM | POA: Diagnosis not present

## 2024-03-24 DIAGNOSIS — E559 Vitamin D deficiency, unspecified: Secondary | ICD-10-CM | POA: Diagnosis not present

## 2024-03-24 DIAGNOSIS — E0590 Thyrotoxicosis, unspecified without thyrotoxic crisis or storm: Secondary | ICD-10-CM | POA: Diagnosis not present

## 2024-03-24 DIAGNOSIS — E669 Obesity, unspecified: Secondary | ICD-10-CM | POA: Diagnosis not present

## 2024-03-24 DIAGNOSIS — E119 Type 2 diabetes mellitus without complications: Secondary | ICD-10-CM | POA: Diagnosis not present

## 2024-03-24 DIAGNOSIS — E611 Iron deficiency: Secondary | ICD-10-CM | POA: Diagnosis not present

## 2024-03-24 DIAGNOSIS — R5383 Other fatigue: Secondary | ICD-10-CM | POA: Diagnosis not present

## 2024-03-24 DIAGNOSIS — Z0000 Encounter for general adult medical examination without abnormal findings: Secondary | ICD-10-CM | POA: Diagnosis not present

## 2024-03-24 DIAGNOSIS — M5441 Lumbago with sciatica, right side: Secondary | ICD-10-CM | POA: Diagnosis not present

## 2024-03-24 DIAGNOSIS — E785 Hyperlipidemia, unspecified: Secondary | ICD-10-CM | POA: Diagnosis not present

## 2024-03-25 DIAGNOSIS — R748 Abnormal levels of other serum enzymes: Secondary | ICD-10-CM | POA: Diagnosis not present

## 2024-03-31 ENCOUNTER — Encounter: Payer: Self-pay | Admitting: Internal Medicine

## 2024-04-01 ENCOUNTER — Ambulatory Visit (INDEPENDENT_AMBULATORY_CARE_PROVIDER_SITE_OTHER)

## 2024-04-01 VITALS — Ht 74.0 in | Wt 268.0 lb

## 2024-04-01 DIAGNOSIS — Z23 Encounter for immunization: Secondary | ICD-10-CM

## 2024-04-01 NOTE — Progress Notes (Signed)
 After obtaining consent, and per orders of Dr. Sam, Flu shot given by Riannah Stagner L Bralon Antkowiak in right arm.

## 2024-04-13 DIAGNOSIS — R748 Abnormal levels of other serum enzymes: Secondary | ICD-10-CM | POA: Diagnosis not present

## 2024-04-13 DIAGNOSIS — K760 Fatty (change of) liver, not elsewhere classified: Secondary | ICD-10-CM | POA: Diagnosis not present

## 2024-04-22 DIAGNOSIS — Z9841 Cataract extraction status, right eye: Secondary | ICD-10-CM | POA: Diagnosis not present

## 2024-04-22 DIAGNOSIS — H26491 Other secondary cataract, right eye: Secondary | ICD-10-CM | POA: Diagnosis not present

## 2024-04-22 DIAGNOSIS — Z961 Presence of intraocular lens: Secondary | ICD-10-CM | POA: Diagnosis not present

## 2024-04-22 DIAGNOSIS — E119 Type 2 diabetes mellitus without complications: Secondary | ICD-10-CM | POA: Diagnosis not present

## 2024-04-27 DIAGNOSIS — E119 Type 2 diabetes mellitus without complications: Secondary | ICD-10-CM | POA: Diagnosis not present

## 2024-04-27 DIAGNOSIS — E669 Obesity, unspecified: Secondary | ICD-10-CM | POA: Diagnosis not present

## 2024-04-27 DIAGNOSIS — Z6835 Body mass index (BMI) 35.0-35.9, adult: Secondary | ICD-10-CM | POA: Diagnosis not present

## 2024-04-27 DIAGNOSIS — E0590 Thyrotoxicosis, unspecified without thyrotoxic crisis or storm: Secondary | ICD-10-CM | POA: Diagnosis not present

## 2024-04-27 DIAGNOSIS — K760 Fatty (change of) liver, not elsewhere classified: Secondary | ICD-10-CM | POA: Diagnosis not present

## 2024-04-27 DIAGNOSIS — I10 Essential (primary) hypertension: Secondary | ICD-10-CM | POA: Diagnosis not present

## 2024-05-25 ENCOUNTER — Encounter: Payer: Self-pay | Admitting: Radiology

## 2024-05-26 DIAGNOSIS — I10 Essential (primary) hypertension: Secondary | ICD-10-CM | POA: Diagnosis not present

## 2024-05-26 DIAGNOSIS — K760 Fatty (change of) liver, not elsewhere classified: Secondary | ICD-10-CM | POA: Diagnosis not present

## 2024-05-26 DIAGNOSIS — E119 Type 2 diabetes mellitus without complications: Secondary | ICD-10-CM | POA: Diagnosis not present

## 2024-05-26 DIAGNOSIS — E0590 Thyrotoxicosis, unspecified without thyrotoxic crisis or storm: Secondary | ICD-10-CM | POA: Diagnosis not present

## 2024-06-01 DIAGNOSIS — Z6835 Body mass index (BMI) 35.0-35.9, adult: Secondary | ICD-10-CM | POA: Diagnosis not present

## 2024-06-01 DIAGNOSIS — I10 Essential (primary) hypertension: Secondary | ICD-10-CM | POA: Diagnosis not present

## 2024-06-01 DIAGNOSIS — E669 Obesity, unspecified: Secondary | ICD-10-CM | POA: Diagnosis not present

## 2024-06-01 DIAGNOSIS — E119 Type 2 diabetes mellitus without complications: Secondary | ICD-10-CM | POA: Diagnosis not present

## 2024-06-10 DIAGNOSIS — M9902 Segmental and somatic dysfunction of thoracic region: Secondary | ICD-10-CM | POA: Diagnosis not present

## 2024-06-10 DIAGNOSIS — M9901 Segmental and somatic dysfunction of cervical region: Secondary | ICD-10-CM | POA: Diagnosis not present

## 2024-06-10 DIAGNOSIS — M5386 Other specified dorsopathies, lumbar region: Secondary | ICD-10-CM | POA: Diagnosis not present

## 2024-06-10 DIAGNOSIS — M9903 Segmental and somatic dysfunction of lumbar region: Secondary | ICD-10-CM | POA: Diagnosis not present

## 2024-06-10 DIAGNOSIS — M531 Cervicobrachial syndrome: Secondary | ICD-10-CM | POA: Diagnosis not present

## 2024-06-15 DIAGNOSIS — M9901 Segmental and somatic dysfunction of cervical region: Secondary | ICD-10-CM | POA: Diagnosis not present

## 2024-06-15 DIAGNOSIS — M5386 Other specified dorsopathies, lumbar region: Secondary | ICD-10-CM | POA: Diagnosis not present

## 2024-06-15 DIAGNOSIS — M9903 Segmental and somatic dysfunction of lumbar region: Secondary | ICD-10-CM | POA: Diagnosis not present

## 2024-06-15 DIAGNOSIS — M9902 Segmental and somatic dysfunction of thoracic region: Secondary | ICD-10-CM | POA: Diagnosis not present

## 2024-06-15 DIAGNOSIS — M531 Cervicobrachial syndrome: Secondary | ICD-10-CM | POA: Diagnosis not present

## 2024-06-16 DIAGNOSIS — G8918 Other acute postprocedural pain: Secondary | ICD-10-CM | POA: Diagnosis not present

## 2024-06-17 DIAGNOSIS — M9903 Segmental and somatic dysfunction of lumbar region: Secondary | ICD-10-CM | POA: Diagnosis not present

## 2024-06-17 DIAGNOSIS — M5386 Other specified dorsopathies, lumbar region: Secondary | ICD-10-CM | POA: Diagnosis not present

## 2024-06-17 DIAGNOSIS — M531 Cervicobrachial syndrome: Secondary | ICD-10-CM | POA: Diagnosis not present

## 2024-06-17 DIAGNOSIS — M9901 Segmental and somatic dysfunction of cervical region: Secondary | ICD-10-CM | POA: Diagnosis not present

## 2024-06-17 DIAGNOSIS — M9902 Segmental and somatic dysfunction of thoracic region: Secondary | ICD-10-CM | POA: Diagnosis not present

## 2024-06-22 DIAGNOSIS — M5386 Other specified dorsopathies, lumbar region: Secondary | ICD-10-CM | POA: Diagnosis not present

## 2024-06-22 DIAGNOSIS — M531 Cervicobrachial syndrome: Secondary | ICD-10-CM | POA: Diagnosis not present

## 2024-06-22 DIAGNOSIS — M9902 Segmental and somatic dysfunction of thoracic region: Secondary | ICD-10-CM | POA: Diagnosis not present

## 2024-06-22 DIAGNOSIS — M9903 Segmental and somatic dysfunction of lumbar region: Secondary | ICD-10-CM | POA: Diagnosis not present

## 2024-06-22 DIAGNOSIS — M9901 Segmental and somatic dysfunction of cervical region: Secondary | ICD-10-CM | POA: Diagnosis not present

## 2024-06-29 DIAGNOSIS — M5386 Other specified dorsopathies, lumbar region: Secondary | ICD-10-CM | POA: Diagnosis not present

## 2024-06-29 DIAGNOSIS — M9902 Segmental and somatic dysfunction of thoracic region: Secondary | ICD-10-CM | POA: Diagnosis not present

## 2024-06-29 DIAGNOSIS — M531 Cervicobrachial syndrome: Secondary | ICD-10-CM | POA: Diagnosis not present

## 2024-06-29 DIAGNOSIS — M9903 Segmental and somatic dysfunction of lumbar region: Secondary | ICD-10-CM | POA: Diagnosis not present

## 2024-06-29 DIAGNOSIS — M9901 Segmental and somatic dysfunction of cervical region: Secondary | ICD-10-CM | POA: Diagnosis not present

## 2024-07-06 DIAGNOSIS — M9903 Segmental and somatic dysfunction of lumbar region: Secondary | ICD-10-CM | POA: Diagnosis not present

## 2024-07-06 DIAGNOSIS — M531 Cervicobrachial syndrome: Secondary | ICD-10-CM | POA: Diagnosis not present

## 2024-07-06 DIAGNOSIS — M5386 Other specified dorsopathies, lumbar region: Secondary | ICD-10-CM | POA: Diagnosis not present

## 2024-07-06 DIAGNOSIS — M9901 Segmental and somatic dysfunction of cervical region: Secondary | ICD-10-CM | POA: Diagnosis not present

## 2024-07-06 DIAGNOSIS — M9902 Segmental and somatic dysfunction of thoracic region: Secondary | ICD-10-CM | POA: Diagnosis not present

## 2024-07-13 DIAGNOSIS — M9903 Segmental and somatic dysfunction of lumbar region: Secondary | ICD-10-CM | POA: Diagnosis not present

## 2024-07-13 DIAGNOSIS — M9901 Segmental and somatic dysfunction of cervical region: Secondary | ICD-10-CM | POA: Diagnosis not present

## 2024-07-13 DIAGNOSIS — M5386 Other specified dorsopathies, lumbar region: Secondary | ICD-10-CM | POA: Diagnosis not present

## 2024-07-13 DIAGNOSIS — M531 Cervicobrachial syndrome: Secondary | ICD-10-CM | POA: Diagnosis not present

## 2024-07-13 DIAGNOSIS — M9902 Segmental and somatic dysfunction of thoracic region: Secondary | ICD-10-CM | POA: Diagnosis not present

## 2024-08-06 NOTE — Progress Notes (Signed)
 Jeffery [name]                                          MRN: [id number]   08/06/2024   The [facility] Quality Team Specialist reviewed this patient medical record for the purposes of chart review for care gap closure. The following were reviewed: chart review for care gap closure-controlling blood pressure.    [facility] Quality Team

## 2025-01-26 ENCOUNTER — Ambulatory Visit: Admitting: Internal Medicine
